# Patient Record
Sex: Male | Born: 1945 | Race: White | Hispanic: No | Marital: Married | Smoking: Former smoker
Health system: Southern US, Community
[De-identification: ages and names within clinical notes are randomized; demographics above are authoritative.]

## PROBLEM LIST (undated history)

## (undated) DIAGNOSIS — Z87898 Personal history of other specified conditions: Secondary | ICD-10-CM

## (undated) DIAGNOSIS — H3530 Unspecified macular degeneration: Secondary | ICD-10-CM

## (undated) DIAGNOSIS — Z86010 Personal history of colonic polyps: Secondary | ICD-10-CM

## (undated) DIAGNOSIS — K4090 Unilateral inguinal hernia, without obstruction or gangrene, not specified as recurrent: Secondary | ICD-10-CM

## (undated) DIAGNOSIS — D126 Benign neoplasm of colon, unspecified: Secondary | ICD-10-CM

## (undated) DIAGNOSIS — Z973 Presence of spectacles and contact lenses: Secondary | ICD-10-CM

## (undated) DIAGNOSIS — N400 Enlarged prostate without lower urinary tract symptoms: Secondary | ICD-10-CM

## (undated) DIAGNOSIS — Z8719 Personal history of other diseases of the digestive system: Secondary | ICD-10-CM

## (undated) DIAGNOSIS — R011 Cardiac murmur, unspecified: Secondary | ICD-10-CM

## (undated) DIAGNOSIS — R911 Solitary pulmonary nodule: Secondary | ICD-10-CM

## (undated) DIAGNOSIS — K5730 Diverticulosis of large intestine without perforation or abscess without bleeding: Secondary | ICD-10-CM

## (undated) DIAGNOSIS — Z860101 Personal history of adenomatous and serrated colon polyps: Secondary | ICD-10-CM

## (undated) DIAGNOSIS — R7303 Prediabetes: Secondary | ICD-10-CM

## (undated) DIAGNOSIS — K649 Unspecified hemorrhoids: Secondary | ICD-10-CM

## (undated) DIAGNOSIS — J439 Emphysema, unspecified: Secondary | ICD-10-CM

## (undated) DIAGNOSIS — K648 Other hemorrhoids: Secondary | ICD-10-CM

## (undated) DIAGNOSIS — K08109 Complete loss of teeth, unspecified cause, unspecified class: Secondary | ICD-10-CM

## (undated) DIAGNOSIS — I700 Atherosclerosis of aorta: Secondary | ICD-10-CM

## (undated) DIAGNOSIS — Z87820 Personal history of traumatic brain injury: Secondary | ICD-10-CM

## (undated) DIAGNOSIS — I10 Essential (primary) hypertension: Secondary | ICD-10-CM

## (undated) DIAGNOSIS — G3184 Mild cognitive impairment, so stated: Secondary | ICD-10-CM

## (undated) HISTORY — DX: Unspecified macular degeneration: H35.30

## (undated) HISTORY — DX: Benign neoplasm of colon, unspecified: D12.6

## (undated) HISTORY — PX: OTHER SURGICAL HISTORY: SHX169

## (undated) HISTORY — PX: POLYPECTOMY: SHX149

## (undated) HISTORY — DX: Other hemorrhoids: K64.8

## (undated) HISTORY — DX: Essential (primary) hypertension: I10

## (undated) HISTORY — PX: COLONOSCOPY: SHX174

---
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
## 1998-03-21 ENCOUNTER — Encounter: Payer: Self-pay | Admitting: Internal Medicine

## 2001-09-08 ENCOUNTER — Encounter: Payer: Self-pay | Admitting: Internal Medicine

## 2004-07-27 ENCOUNTER — Ambulatory Visit: Payer: Self-pay | Admitting: Internal Medicine

## 2004-08-17 ENCOUNTER — Ambulatory Visit: Payer: Self-pay | Admitting: Internal Medicine

## 2004-09-07 ENCOUNTER — Ambulatory Visit: Payer: Self-pay | Admitting: Internal Medicine

## 2004-09-08 ENCOUNTER — Encounter: Admission: RE | Admit: 2004-09-08 | Discharge: 2004-09-08 | Payer: Self-pay | Admitting: Internal Medicine

## 2006-06-28 ENCOUNTER — Ambulatory Visit: Payer: Self-pay | Admitting: Internal Medicine

## 2006-06-28 LAB — CONVERTED CEMR LAB
Hgb A1c MFr Bld: 5.8 % (ref 4.6–6.0)
LDL Cholesterol: 86 mg/dL (ref 0–99)
PSA: 1.53 ng/mL (ref 0.10–4.00)
VLDL: 18 mg/dL (ref 0–40)

## 2007-11-17 ENCOUNTER — Ambulatory Visit: Payer: Self-pay | Admitting: Gastroenterology

## 2007-11-21 ENCOUNTER — Telehealth: Payer: Self-pay | Admitting: Gastroenterology

## 2007-12-11 ENCOUNTER — Ambulatory Visit: Payer: Self-pay | Admitting: Gastroenterology

## 2007-12-11 ENCOUNTER — Encounter: Payer: Self-pay | Admitting: Gastroenterology

## 2007-12-12 ENCOUNTER — Encounter: Payer: Self-pay | Admitting: Gastroenterology

## 2008-01-26 ENCOUNTER — Ambulatory Visit: Payer: Self-pay | Admitting: Internal Medicine

## 2008-01-26 DIAGNOSIS — I10 Essential (primary) hypertension: Secondary | ICD-10-CM | POA: Insufficient documentation

## 2008-01-26 DIAGNOSIS — F17210 Nicotine dependence, cigarettes, uncomplicated: Secondary | ICD-10-CM | POA: Insufficient documentation

## 2008-01-26 DIAGNOSIS — Z720 Tobacco use: Secondary | ICD-10-CM | POA: Insufficient documentation

## 2008-01-26 DIAGNOSIS — D126 Benign neoplasm of colon, unspecified: Secondary | ICD-10-CM | POA: Insufficient documentation

## 2008-01-27 LAB — CONVERTED CEMR LAB
BUN: 10 mg/dL (ref 6–23)
Creatinine, Ser: 0.7 mg/dL (ref 0.4–1.5)
PSA: 1.53 ng/mL (ref 0.10–4.00)
Potassium: 4.2 meq/L (ref 3.5–5.1)

## 2008-01-29 ENCOUNTER — Encounter (INDEPENDENT_AMBULATORY_CARE_PROVIDER_SITE_OTHER): Payer: Self-pay | Admitting: *Deleted

## 2009-01-14 ENCOUNTER — Encounter: Payer: Self-pay | Admitting: Internal Medicine

## 2009-04-01 ENCOUNTER — Encounter: Payer: Self-pay | Admitting: Internal Medicine

## 2009-04-08 ENCOUNTER — Telehealth (INDEPENDENT_AMBULATORY_CARE_PROVIDER_SITE_OTHER): Payer: Self-pay | Admitting: *Deleted

## 2009-04-10 ENCOUNTER — Ambulatory Visit: Payer: Self-pay | Admitting: Gastroenterology

## 2009-04-10 DIAGNOSIS — K642 Third degree hemorrhoids: Secondary | ICD-10-CM | POA: Insufficient documentation

## 2009-04-10 DIAGNOSIS — Z860100 Personal history of colon polyps, unspecified: Secondary | ICD-10-CM | POA: Insufficient documentation

## 2009-04-10 DIAGNOSIS — K5730 Diverticulosis of large intestine without perforation or abscess without bleeding: Secondary | ICD-10-CM | POA: Insufficient documentation

## 2009-04-10 DIAGNOSIS — K644 Residual hemorrhoidal skin tags: Secondary | ICD-10-CM | POA: Insufficient documentation

## 2009-07-11 ENCOUNTER — Ambulatory Visit: Payer: Self-pay | Admitting: Internal Medicine

## 2009-07-11 DIAGNOSIS — N401 Enlarged prostate with lower urinary tract symptoms: Secondary | ICD-10-CM

## 2009-07-11 DIAGNOSIS — N138 Other obstructive and reflux uropathy: Secondary | ICD-10-CM | POA: Insufficient documentation

## 2009-10-28 ENCOUNTER — Encounter (INDEPENDENT_AMBULATORY_CARE_PROVIDER_SITE_OTHER): Payer: Self-pay | Admitting: *Deleted

## 2009-10-31 ENCOUNTER — Encounter: Payer: Self-pay | Admitting: Gastroenterology

## 2010-01-08 ENCOUNTER — Encounter (INDEPENDENT_AMBULATORY_CARE_PROVIDER_SITE_OTHER): Payer: Self-pay | Admitting: *Deleted

## 2010-01-09 ENCOUNTER — Ambulatory Visit: Payer: Self-pay | Admitting: Gastroenterology

## 2010-01-20 ENCOUNTER — Ambulatory Visit: Payer: Self-pay | Admitting: Gastroenterology

## 2010-01-23 ENCOUNTER — Encounter: Payer: Self-pay | Admitting: Gastroenterology

## 2010-03-07 ENCOUNTER — Encounter: Payer: Self-pay | Admitting: Internal Medicine

## 2010-07-12 LAB — CONVERTED CEMR LAB
ALT: 23 units/L (ref 0–53)
BUN: 7 mg/dL (ref 6–23)
CO2: 29 meq/L (ref 19–32)
Chloride: 100 meq/L (ref 96–112)
GFR calc non Af Amer: 120.69 mL/min (ref >60)
Glucose, Bld: 93 mg/dL (ref 70–99)
HCT: 43 % (ref 39.0–52.0)
Hemoglobin: 14.7 g/dL (ref 13.0–17.0)
Lymphocytes Relative: 25.3 % (ref 12.0–46.0)
MCHC: 34.3 g/dL (ref 30.0–36.0)
MCV: 97.1 fL (ref 78.0–100.0)
Neutro Abs: 5.9 10*3/uL (ref 1.4–7.7)
Neutrophils Relative %: 62.2 % (ref 43.0–77.0)
PSA: 2.77 ng/mL (ref 0.10–4.00)
Platelets: 262 10*3/uL (ref 150.0–400.0)
Potassium: 3.8 meq/L (ref 3.5–5.1)
RBC: 4.42 M/uL (ref 4.22–5.81)
RDW: 13.3 % (ref 11.5–14.6)
Sodium: 136 meq/L (ref 135–145)
Total CHOL/HDL Ratio: 2
WBC: 9.4 10*3/uL (ref 4.5–10.5)

## 2010-07-15 NOTE — Letter (Signed)
Summary: [facility] Instructions  [facility]  [address] [place], [place] [address]   Phone: [phone]  Fax: [phone]       Hunter Boone    1946/02/06    MRN: [id number]        Procedure Day Dorna Bloom:  [name]  01/20/10     Arrival Time:  10:00AM     Procedure Time:  11:00AM     Location of Procedure:                    [name] _  [facility] (4th Floor)                        PREPARATION FOR COLONOSCOPY WITH MOVIPREP   Starting 5 days prior to your procedure 01/15/10 do not eat nuts, seeds, popcorn, corn, beans, peas,  salads, or any raw vegetables.  Do not take any fiber supplements (e.g. Metamucil, Citrucel, and Benefiber).  THE DAY BEFORE YOUR PROCEDURE         DATE: 01/19/10  DAY: MONDAY  1.  Drink clear liquids the entire day-NO SOLID FOOD  2.  Do not drink anything colored red or purple.  Avoid juices with pulp.  No orange juice.  3.  Drink at least 64 oz. (8 glasses) of fluid/clear liquids during the day to prevent dehydration and help the prep work efficiently.  CLEAR LIQUIDS INCLUDE: Water Jello Ice Popsicles Tea (sugar ok, no milk/cream) Powdered fruit flavored drinks Coffee (sugar ok, no milk/cream) Gatorade Juice: apple, white grape, white cranberry  Lemonade Clear bullion, consomm, broth Carbonated beverages (any kind) Strained chicken noodle soup Hard Candy                             4.  In the morning, mix first dose of MoviPrep solution:    Empty 1 Pouch A and 1 Pouch B into the disposable container    Add lukewarm drinking water to the top line of the container. Mix to dissolve    Refrigerate (mixed solution should be used within 24 hrs)  5.  Begin drinking the prep at 5:00 p.m. The MoviPrep container is divided by 4 marks.   Every 15 minutes drink the solution down to the next mark (approximately 8 oz) until the full liter is complete.   6.  Follow completed prep with 16 oz of clear liquid of your choice (Nothing  red or purple).  Continue to drink clear liquids until bedtime.  7.  Before going to bed, mix second dose of MoviPrep solution:    Empty 1 Pouch A and 1 Pouch B into the disposable container    Add lukewarm drinking water to the top line of the container. Mix to dissolve    Refrigerate  THE DAY OF YOUR PROCEDURE      DATE: 01/20/10 DAY: TUESDAY  Beginning at 6:00AM (5 hours before procedure):         1. Every 15 minutes, drink the solution down to the next mark (approx 8 oz) until the full liter is complete.  2. Follow completed prep with 16 oz. of clear liquid of your choice.    3. You may drink clear liquids until 9:00AM (2 HOURS BEFORE PROCEDURE).   MEDICATION INSTRUCTIONS  Unless otherwise instructed, you should take regular prescription medications with a small sip of water   as early as possible the morning of  your procedure.   Additional medication instructions:   Hold HCTZ the morning of procedure.       OTHER INSTRUCTIONS  You will need a responsible adult at least 65 years of age to accompany you and drive you home.   This person must remain in the waiting room during your procedure.  Wear loose fitting clothing that is easily removed.  Leave jewelry and other valuables at home.  However, you may wish to bring a book to read or  an iPod/MP3 player to listen to music as you wait for your procedure to start.  Remove all body piercing jewelry and leave at home.  Total time from sign-in until discharge is approximately 2-3 hours.  You should go home directly after your procedure and rest.  You can resume normal activities the  day after your procedure.  The day of your procedure you should not:   Drive   Make legal decisions   Operate machinery   Drink alcohol   Return to work  You will receive specific instructions about eating, activities and medications before you leave.    The above instructions have been reviewed and explained to me by  [name] RN  January 09, 2010 9:39 AM     I fully understand and can verbalize these instructions _____________________________ Date _________

## 2010-07-15 NOTE — Miscellaneous (Signed)
Summary: [facility]  [facility]   Imported By: Lanelle Bal 03/16/2010 12:29:24  _____________________________________________________________________  External Attachment:    Type:   Image     Comment:   External Document

## 2010-07-15 NOTE — Letter (Signed)
Summary: Cancer Screening/[facility] Risk Profile  Cancer Screening/[facility]   Imported By: Lanelle Bal 07/17/2009 13:18:43  _____________________________________________________________________  External Attachment:    Type:   Image     Comment:   External Document

## 2010-07-15 NOTE — Miscellaneous (Signed)
Summary: Medication refill  Clinical Lists Changes  Medications: Changed medication from ANAMANTLE HC 3-0.5 % CREA (LIDOCAINE-HYDROCORTISONE ACE) apply rectally three times a day as needed to HYDROCORTISONE 2.5 % CREA (HYDROCORTISONE) apply rectally three times a day as needed - Signed Rx of HYDROCORTISONE 2.5 % CREA (HYDROCORTISONE) apply rectally three times a day as needed;  #98 g x 1;  Signed;  Entered by: Christie Nottingham CMA (AAMA);  Authorized by: Meryl Dare MD Clementeen Graham;  Method used: Electronically to [facility]*, [address] [place], [place]  [id number], Ph: [phone], Fax: [phone]    Prescriptions: HYDROCORTISONE 2.5 % CREA (HYDROCORTISONE) apply rectally three times a day as needed  #98 g x 1   Entered by:   Christie Nottingham CMA (AAMA)   Authorized by:   Meryl Dare MD [facility]   Signed by:   Christie Nottingham CMA Duncan Dull) on 10/31/2009   Method used:   Electronically to        [facility]* (retail)       [address]       [place], [place]  [address]       Ph: [phone]       Fax: [phone]   RxID:   [id number]

## 2010-07-15 NOTE — Assessment & Plan Note (Signed)
Summary: CPX AND FASTING LABS///[facility]   Vital Signs:  Patient profile:   65 year old male Height:      70 inches Weight:      141.4 pounds BMI:     20.36 Temp:     98.8 degrees F oral Pulse rate:   73 / minute Resp:     14 per minute BP sitting:   155 / 86  (left arm) Cuff size:   regular  Vitals Entered By: Shonna Chock (July 11, 2009 8:38 AM)  Comments REVIEWED MED LIST, PATIENT AGREED DOSE AND INSTRUCTION CORRECT    Primary Care Provider:  Marga Melnick, MD   History of Present Illness: Mr. Hunter Boone is here for a physical; he is recovering from a "cold". See BP; 130/81-82 @ home.  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 dementia ; Siblings: ? DM in bro  ; PGM ?DM; M aunt CVA; M aunt hepatic CA  Social History: no diet Occupation: Science writer Patient currently smokes. Pipe, 2 bowls / night Alcohol Use - 2-3 weekly Daily Caffeine Use 4/5 cups coffee / day Regular exercise-yes: walking 1/2 mpd  Review of Systems  The patient denies anorexia, fever, weight loss, weight gain, vision loss, decreased hearing, chest pain, syncope, dyspnea on exertion, peripheral edema, abdominal pain, melena, hematochezia, severe indigestion/heartburn, hematuria, suspicious skin lesions, depression, unusual weight change, abnormal bleeding, enlarged lymph nodes, and angioedema.         Some hesitancy; no nocturia. ENT:  Denies difficulty swallowing, ear discharge, earache, hoarseness, nasal congestion, sinus pressure, and sore throat;  No frontal headache, facial pain or purulence. Resp:  Complains of cough and sputum productive; denies excessive snoring, hypersomnolence, morning headaches, shortness of breath, and wheezing; Dark phlegm, decreasing.  Physical Exam  General:  Thin,in no acute distress; alert,appropriate and cooperative throughout examination Head:  Normocephalic and atraumatic without obvious abnormalities. Pattern  alopecia ; moustache & beard Eyes:  No corneal or conjunctival inflammation noted.[name] Perrla. Funduscopic exam benign, without hemorrhages, exudates or papilledema. Ears:  External ear exam shows no significant lesions or deformities.  Otoscopic examination reveals clear canals, tympanic membranes are intact bilaterally without bulging, retraction, inflammation or discharge. Hearing is grossly normal bilaterally. Nose:  External nasal examination shows no deformity or inflammation. Nasal mucosa : mild erythema without lesions or exudates.  Mouth:  Oral mucosa and oropharynx without lesions or exudates.  Teeth in good repair.Mild uvular erythema with mucocoele  Neck:  No deformities, masses, or tenderness noted. Chest Wall:  Increased AP diameter Lungs:  Normal respiratory effort, chest expands symmetrically. Lungs are clear to auscultation, no crackles or wheezes. BS slightly decreased Heart:  Normal rate and regular rhythm. S1 and S2 normal without gallop, murmur, click, rub . Heart heard best in epigastrium Abdomen:  Bowel sounds positive,abdomen soft and non-tender without masses, organomegaly .Weakness R inguinal area w/o hernia. Rectal:  No external abnormalities noted. Normal sphincter tone. No rectal masses or tenderness. Genitalia:  Testes bilaterally descended without nodularity, tenderness or masses. No scrotal masses or lesions. No penis lesions or urethral discharge. Prostate:  no nodules, no  asymmetry, no induration, and 1+ enlarged.   Msk:  No deformity or scoliosis noted of thoracic or  lumbar spine.   Pulses:  R and L carotid,radial,dorsalis pedis and posterior tibial pulses are full and equal bilaterally Extremities:  No  cyanosis, edema, or deformity noted with normal full range of motion of all joints.  Minimal clubbing; deformed L thumbnail Neurologic:  alert & oriented X3 and DTRs symmetrical and normal.   Skin:  Intact without suspicious lesions or rashes. Rosacea not active. Keratosis L shoulder Cervical Nodes:  No lymphadenopathy noted Axillary Nodes:  No palpable lymphadenopathy Inguinal Nodes:  No significant adenopathy Psych:  memory intact for recent and remote, normally interactive, and good eye contact.     Impression & Recommendations:  Problem # 1:  PREVENTIVE HEALTH CARE (ICD-V70.0)  Orders: T-2 View CXR (71020TC) Tobacco use cessation intermediate 3-10 minutes ([address]) EKG w/ Interpretation (93[address]) Venipuncture ([address]) TLB-Lipid Panel (80061-LIPID) TLB-BMP (Basic Metabolic Panel-BMET) (80048-METABOL) TLB-CBC Platelet - w/Differential (85025-CBCD) TLB-Hepatic/Liver Function Pnl (80076-HEPATIC) TLB-TSH (Thyroid Stimulating Hormone) (84443-TSH) TLB-PSA (Prostate Specific Antigen) (84153-PSA)  Problem # 2:  ACUTE BRONCHITIS (ICD-466.0)  resolving ; meds declined  Orders: Venipuncture (60454)  Problem # 3:  HYPERPLASIA 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 Take 1 tab  once daily  Orders: EKG w/ Interpretation (93000) Venipuncture (29562) TLB-Lipid Panel (80061-LIPID)  Complete Medication  List: 1)  Hydrochlorothiazide 25 Mg Tabs (Hydrochlorothiazide) .... Take 1 tab  once daily 2)  Metronidazole 0.75 % Ext Gel (Metronidazole) .... Apply daily as needed 3)  Centrum Silver Tabs (Multiple vitamins-minerals) .... Once daily 4)  Preservision Areds Caps (Multiple vitamins-minerals) .... Take 4 tablets once daily 5)  Fish Oil Maximum Strength 1200 Mg Caps (Omega-3 fatty acids) .... Once daily 6)  Lutein 20 Mg Caps (Lutein) .[name].. 1 by mouth once daily 7)  Anamantle Hc 3-0.5 % Crea (Lidocaine-hydrocortisone ace) .... Apply rectally three times a day as needed 8)  Anusol-hc 25 Mg Supp (Hydrocortisone acetate) .... One suppository into rectum at bedtime 9)  Align Caps (Probiotic product) .... One tablet by mouth once daily  Patient Instructions: 1)  Stop Smoking Tips: Choose a Quit date. Cut down before the Quit date. decide what you will do as a substitute when you feel the urge to smoke(gum,toothpick,exercise). 2)  Check your Blood Pressure regularly. If it is above: 135/85 ON AVERAGE you should make an appointment. Consider Saw Palmetto Extract for BPH. Prescriptions: METRONIDAZOLE 0.75 % EXT GEL (METRONIDAZOLE) APPLY DAILY as needed  #60 grams x 5   Entered and Authorized by:   Marga Melnick MD   Signed by:   Marga Melnick MD on 07/11/2009   Method used:   Print then Give to Patient   RxID:   [id number] HYDROCHLOROTHIAZIDE 25 MG  TABS (HYDROCHLOROTHIAZIDE) take 1 tab  once daily  #90 Tablet x 3   Entered and Authorized by:   Marga Melnick MD   Signed by:   Marga Melnick MD on 07/11/2009   Method used:   Print then Give to Patient   RxID:   [id number]

## 2010-07-15 NOTE — Procedures (Signed)
Summary: Colonoscopy  Patient: [name] Note: All result statuses are Final unless otherwise noted.  Tests: (1) Colonoscopy (COL)   COL Colonoscopy           DONE (C)     [facility]     [address]     [place], [place]  [address]           COLONOSCOPY PROCEDURE REPORT           PATIENT:  Hunter Boone, Hunter Boone  MR#:  [id number]     BIRTHDATE:  [birth year], 64 yrs. old  GENDER:  male     ENDOSCOPIST:  Judie Petit T. Russella Dar, MD, [facility]           PROCEDURE DATE:  01/20/2010     PROCEDURE:  Colonoscopy with biopsy     ASA CLASS:  Class II     INDICATIONS:  1) surveillance and high-risk screening  2)     follow-up of polyp, adenomatous polyps, 11/2007. Piecemeal     polypectomy.     MEDICATIONS:   Fentanyl 50 mcg IV, Versed 7 mg IV     DESCRIPTION OF PROCEDURE:   After the risks benefits and     alternatives of the procedure were thoroughly explained, informed     consent was obtained.  Digital rectal exam was performed and     revealed moderate external hemorrhoids.   The LB PCF-Q180AL     [id number] endoscope was introduced through the anus and advanced to     the cecum, which was identified by both the appendix and ileocecal     valve, limited by fair prep.    The quality of the prep was     Moviprep fair.  The instrument was then slowly withdrawn as the     colon was fully examined.     <<PROCEDUREIMAGES>>     FINDINGS:  Mild diverticulosis was found in the descending colon.     Four polyps were found in the sigmoid colon. They were 2 - 3 mm in     size. The polyps were removed using cold biopsy forceps.  A normal     appearing cecum, ileocecal valve, and appendiceal orifice were     identified. The ascending, hepatic flexure, transverse, splenic     flexure, and rectum appeared unremarkable. Retroflexed views in     the rectum revealed internal hemorrhoids, moderate.  The time to     cecum =  4  minutes. The scope was then withdrawn (time =  12     min) from the patient and the  procedure completed.     COMPLICATIONS:  None           ENDOSCOPIC IMPRESSION:     1) Mild diverticulosis in the descending colon     2) 2 - 3 mm, Four polyps in the sigmoid colon     3) Internal and external hemorrhoids           RECOMMENDATIONS:     1) Await pathology     2) High fiber diet with liberal fluid intake.     3) Repeat Colonoscopy in 3 years  given fair prep and history of     polyps.           Venita Lick. Russella Dar, MD, Clementeen Graham           CC: Pecola Lawless, MD           n.  REVISED:  01/20/2010 12:02 PM     eSIGNED:   Judie Petit T. Stark at 01/20/2010 12:02 PM           Hunter Boone, [id number]  Note: An exclamation mark (!) indicates a result that was not dispersed into the flowsheet. Document Creation Date: 01/20/2010 12:04 PM _______________________________________________________________________  (1) Order result status: Final Collection or observation date-time: 01/20/2010 11:57 Requested date-time:  Receipt date-time:  Reported date-time:  Referring Physician:   Ordering Physician: Claudette Head [phone]) Specimen Source:  Source: Launa Grill Order Number: [id number] Lab site:   Appended Document: Colonoscopy     Procedures Next Due Date:    Colonoscopy: 01/2013

## 2010-07-15 NOTE — Miscellaneous (Signed)
Summary: [facility] Previsit/prep  Clinical Lists Changes  Medications: Added new medication of MOVIPREP 100 GM  SOLR (PEG-KCL-NACL-NASULF-NA ASC-C) As per prep instructions. - Signed Rx of MOVIPREP 100 GM  SOLR (PEG-KCL-NACL-NASULF-NA ASC-C) As per prep instructions.;  #1 x 0;  Signed;  Entered by: [name] RN;  Authorized by: Meryl Dare MD Clementeen Graham;  Method used: Electronically to [facility]*, [address], [place], [place]  [address], Ph: [phone], Fax: [phone] Observations: Added new observation of NKA: T (01/09/2010 8:51)    Prescriptions: MOVIPREP 100 GM  SOLR (PEG-KCL-NACL-NASULF-NA ASC-C) As per prep instructions.  #1 x 0   Entered by:   [name] RN   Authorized by:   Meryl Dare MD [facility]   Signed by:   [name] RN on 01/09/2010   Method used:   Electronically to        [facility]* (retail)       [address]       [place], [place]  [id number]       Ph: [phone]       Fax: [phone]   RxID:   [id number]

## 2010-07-15 NOTE — Letter (Signed)
Summary: Patient Notice- Polyp Results  [facility]  [address] [place], [place] [address]   Phone: [phone]  Fax: [phone]        January 23, 2010 MRN: [id number]    Hunter Boone [address] [place], [place]  [address]    Dear Hunter Boone,  I am pleased to inform you that the colon polyp(s) removed during your recent colonoscopy was (were) found to be benign (no cancer detected) upon pathologic examination.  I recommend you have a repeat colonoscopy examination in 3 years to look for recurrent polyps, as having colon polyps increases your risk for having recurrent polyps or even colon cancer in the future.  Should you develop new or worsening symptoms of abdominal pain, bowel habit changes or bleeding from the rectum or bowels, please schedule an evaluation with either your primary care physician or with me.  Continue treatment plan as outlined the day of your exam.  Please call us if you are having persistent problems or have questions about your condition that have not been fully answered at this time.  Sincerely,  Meryl Dare MD [facility]  This letter has been electronically signed by your physician.  Appended Document: Patient Notice- Polyp Results letter mailed

## 2010-07-15 NOTE — Letter (Signed)
Summary: Colonoscopy Letter   [facility]  [address] [place], [place] [address]   Phone: [phone]  Fax: [phone]      Oct 28, 2009 MRN: [id number]   Hunter Boone [address] [place], [place]  [address]   Dear [name],   According to your medical record, it is time for you to schedule a Colonoscopy. The American Cancer Society recommends this procedure as a method to detect early colon cancer. Patients with a family history of colon cancer, or a personal history of colon polyps or inflammatory bowel disease are at increased risk.  This letter has beeen generated based on the recommendations made at the time of your procedure. If you feel that in your particular situation this may no longer apply, please contact our office.  Please call our office at [phone] to schedule this appointment or to update your records at your earliest convenience.  Thank you for cooperating with [place] to provide you with the very best care possible.   Sincerely,  Judie Petit T. Russella Dar, M.D.  [facility] [phone]

## 2010-08-19 ENCOUNTER — Encounter: Payer: Self-pay | Admitting: Internal Medicine

## 2010-08-19 ENCOUNTER — Other Ambulatory Visit: Payer: Self-pay | Admitting: Internal Medicine

## 2010-08-19 ENCOUNTER — Encounter (INDEPENDENT_AMBULATORY_CARE_PROVIDER_SITE_OTHER): Payer: Medicare Other | Admitting: Internal Medicine

## 2010-08-19 DIAGNOSIS — E785 Hyperlipidemia, unspecified: Secondary | ICD-10-CM | POA: Insufficient documentation

## 2010-08-19 DIAGNOSIS — Z86010 Personal history of colonic polyps: Secondary | ICD-10-CM

## 2010-08-19 DIAGNOSIS — Z23 Encounter for immunization: Secondary | ICD-10-CM

## 2010-08-19 DIAGNOSIS — I10 Essential (primary) hypertension: Secondary | ICD-10-CM

## 2010-08-19 DIAGNOSIS — Z125 Encounter for screening for malignant neoplasm of prostate: Secondary | ICD-10-CM

## 2010-08-19 DIAGNOSIS — Z0000 Encounter for general adult medical examination without abnormal findings: Secondary | ICD-10-CM

## 2010-08-19 DIAGNOSIS — R7309 Other abnormal glucose: Secondary | ICD-10-CM | POA: Insufficient documentation

## 2010-08-19 LAB — BASIC METABOLIC PANEL
BUN: 10 mg/dL (ref 6–23)
CO2: 27 mEq/L (ref 19–32)
Chloride: 100 mEq/L (ref 96–112)
Creatinine, Ser: 0.8 mg/dL (ref 0.4–1.5)
GFR: 103.1 mL/min (ref >60.00)

## 2010-08-19 LAB — CBC WITH DIFFERENTIAL/PLATELET
Basophils Relative: 0.4 % (ref 0.0–3.0)
Eosinophils Relative: 2 % (ref 0.0–5.0)
HCT: 42.6 % (ref 39.0–52.0)
Hemoglobin: 14.8 g/dL (ref 13.0–17.0)
Lymphocytes Relative: 25.1 % (ref 12.0–46.0)
Neutrophils Relative %: 64.3 % (ref 43.0–77.0)
Platelets: 278 10*3/uL (ref 150.0–400.0)

## 2010-08-19 LAB — LIPID PANEL
Cholesterol: 175 mg/dL (ref 0–200)
LDL Cholesterol: 99 mg/dL (ref 0–99)
Triglycerides: 81 mg/dL (ref 0.0–149.0)

## 2010-08-19 LAB — HEPATIC FUNCTION PANEL
ALT: 25 U/L (ref 0–53)
Alkaline Phosphatase: 53 U/L (ref 39–117)
Bilirubin, Direct: 0.2 mg/dL (ref 0.0–0.3)
Total Protein: 6.9 g/dL (ref 6.0–8.3)

## 2010-08-21 ENCOUNTER — Other Ambulatory Visit: Payer: Self-pay | Admitting: Internal Medicine

## 2010-08-21 ENCOUNTER — Ambulatory Visit (INDEPENDENT_AMBULATORY_CARE_PROVIDER_SITE_OTHER)
Admission: RE | Admit: 2010-08-21 | Discharge: 2010-08-21 | Disposition: A | Discharge status: Home / Self Care | Payer: Medicare Other | Source: Ambulatory Visit | Attending: Internal Medicine | Admitting: Internal Medicine

## 2010-08-21 DIAGNOSIS — F17200 Nicotine dependence, unspecified, uncomplicated: Secondary | ICD-10-CM

## 2010-08-25 NOTE — Assessment & Plan Note (Signed)
Summary: CPX AND FASTING LABS//SPH/PH   Vital Signs:  Patient profile:   65 year old male Height:      70.5 inches Weight:      139.8 pounds BMI:     19.85 Temp:     98.3 degrees F oral Pulse rate:   72 / minute Resp:     14 per minute BP sitting:   128 / 86  (left arm) Cuff size:   regular  Vitals Entered By: Shonna Chock CMA (August 19, 2010 8:29 AM) CC: CPX with fasting labs , Lipid Management   Primary Care Provider:  Marga Melnick, MD  CC:  CPX with fasting labs  and Lipid Management.  History of Present Illness: Here for Medicare AWV:see Diagnoses; chart updated 2Physical Activities: push ups 30 /day & walking 6-7 X / week < 30 min w/o symptoms. 3.Depression/mood: no issues 4.Hearing: whisper heard @ 6 ft 5.ADL's: no limitations 6.Fall Risk: no issues 7.Home Safety: no risk 8.Height, weight, &visual acuity:see VS 9.Counseling: POA & Living Wil to be redrawn 10.Labs ordered based on risk factors: see Orders 11.Referral Coordination: none; Colonoscopy current 12. Care Plan: see Instructions 13. Cognitive Assessment: Oriented X3 ; memory  & recall intact normal  ; "WORLD " spelled backwards; mood & affect normal.    Hypertension Follow-Up: He  denies lightheadedness, urinary frequency, headaches, edema, fatigue.  , chest pain, chest pressure, exercise intolerance, dyspnea, palpitations, and syncope.  Compliance with medications (by patient report) has been near 100%.  The patient reports that dietary compliance has been good.  Adjunctive measures currently used by the patient include salt restriction.  BP @ home < 135/85 on average.  Lipid Management History:      Positive NCEP/ATP III risk factors include male age 11 years old or older, current tobacco user, and hypertension.  Negative NCEP/ATP III risk factors include non-diabetic, HDL cholesterol greater than 60, no family history for ischemic heart disease, no ASHD (atherosclerotic heart disease), no prior stroke/TIA,  no peripheral vascular disease, and no history of aortic aneurysm.     Preventive Screening-Counseling & Management  Alcohol-Tobacco     Alcohol drinks/day: 1     Smoking Status: current     Year Started: 1970     Pipe use/week: 1 bowl / day  Caffeine-Diet-Exercise     Caffeine use/day: 3 coffees; 2 teas / day  Hep-HIV-STD-Contraception     Dental Visit-last 6 months yes     Sun Exposure-Excessive: no  Safety-Violence-Falls     Seat Belt Use: yes     Smoke Detectors: yes      Blood Transfusions:  no.        Travel History:  [place] and 1968-[place].    Current Medications (verified): 1)  Hydrochlorothiazide 25 Mg  Tabs (Hydrochlorothiazide) .... Take 1 Tab  Once Daily 2)  Metronidazole 0.75 % Ext Gel (Metronidazole) .... Apply Daily As Needed 3)  Centrum Silver  Tabs (Multiple Vitamins-Minerals) .... Once Daily 4)  Preservision Areds  Caps (Multiple Vitamins-Minerals) .... Take 4 Tablets Once Daily 5)  Fish Oil Maximum Strength 1200 Mg Caps (Omega-3 Fatty Acids) .... Once Daily 6)  Hydrocortisone 2.5 % Crea (Hydrocortisone) .... Apply Rectally Three Times A Day As Needed 7)  Aspirin 81 Mg Tabs (Aspirin) .[name].. 1 By Mouth Once Daily 8)  Saw Palmetto 450 Mg Caps (Saw Palmetto (Serenoa Repens)) .... 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  Past Surgical History: Colon polypectomy (5)2009& , (4) 2011 , tubular adenmas & hyperplastic , Dr Russella Dar; due   2014  Family History: Father: [place]ers  d 68; Mother: Osteoporosis, ? dementia ; Siblings:bro: ? DM   ; PGM: ?DM; M aunt :CVA; M aunt: hepatic cancer; 1st cousin : hepatic  cancer  Social History: no diet Occupation: Acupuncturist Patient currently smokes. Pipe, 1 bowl / night Alcohol Use 1 / day Daily Caffeine Use 3 cups coffee / day; 2 teas Regular exercise-yes: walking Caffeine use/day:  3 coffees; 2 teas / day Dental Care w/in 6 mos.:  yes Sun Exposure-Excessive:  no Seat Belt Use:  yes Blood Transfusions:  no  Review of Systems  The patient denies anorexia, fever, weight loss, weight gain, vision loss, decreased hearing, hoarseness, prolonged cough, hemoptysis, abdominal pain, melena, hematochezia, severe indigestion/heartburn, hematuria, suspicious skin lesions, abnormal bleeding, enlarged lymph nodes, and angioedema.    Physical Exam  General:  Thin,in no acute distress; alert,appropriate and cooperative throughout examination Head:  Normocephalic and atraumatic without obvious abnormalities. Pattern  alopecia . Eyes:  No corneal or conjunctival inflammation noted. Ptosis OS > OD.Perrla. Funduscopic exam benign, without hemorrhages, exudates or papilledema.  Ears:  External ear exam shows no significant lesions or deformities.  Otoscopic examination reveals clear canals, tympanic membranes are intact bilaterally without bulging, retraction, inflammation or discharge. Hearing is grossly normal bilaterally. Nose:  External nasal examination shows no deformity or inflammation. Nasal mucosa are pink and moist without lesions or exudates. Mouth:  Oral mucosa and oropharynx without lesions or exudates.  Teeth in good repair. Mild pharyngeal erythema.   Neck:  No deformities, masses, or tenderness noted. Lungs:  Normal respiratory effort, chest expands symmetrically. Lungs are clear to auscultation, no crackles or wheezes. Heart:  Normal rate and regular rhythm. S1 and S2 normal without gallop, murmur, click, rub .S4 Abdomen:  Bowel sounds positive,abdomen soft and non-tender without masses, organomegaly or hernias noted. Dullness RUQ Rectal:   External  hemorrhoidal tissues  noted. Normal sphincter tone. No rectal masses or tenderness. Genitalia:  Testes bilaterally descended without nodularity, tenderness or masses. No scrotal masses or lesions. No penis lesions or urethral discharge. Prostate:  Prostate gland firm and smooth, no enlargement, nodularity, tenderness, mass, asymmetry or induration. Msk:  No deformity or scoliosis noted of thoracic or lumbar spine.   Pulses:  R and L carotid,radial,dorsalis pedis and posterior tibial pulses are full and equal bilaterally Extremities:  No clubbing, cyanosis, edema, or deformity noted with normal full range of motion of all joints.   Neurologic:  alert & oriented X3 and DTRs symmetrical and normal.   Skin:  Intact without suspicious lesions. Mild facia Rosacea Cervical Nodes:  No lymphadenopathy noted Axillary Nodes:  No palpable lymphadenopathy Inguinal Nodes:  No significant adenopathy Psych:  memory intact for recent and remote, normally interactive, and good eye contact.     Impression & Recommendations:  Problem # 1:  PREVENTIVE HEALTH CARE (ICD-V70.0)  Orders: Medicare -1st Annual Wellness Visit [phone])  Problem # 2:  HYPERTENSION (ICD-401.9)  His updated medication list for this problem includes:    Hydrochlorothiazide 25 Mg Tabs (Hydrochlorothiazide) .[name]... 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 Take 1 tab  once daily 2)  Metronidazole 0.75 % Ext Gel (Metronidazole) .... Apply daily as needed 3)  Centrum Silver Tabs (Multiple vitamins-minerals) .... Once daily 4)  Preservision Areds Caps (Multiple vitamins-minerals) .... Take 4 tablets once daily 5)  Fish Oil Maximum Strength 1200 Mg Caps (Omega-3 fatty acids) .... Once daily 6)  Hydrocortisone 2.5 % Crea (Hydrocortisone) .... Apply rectally three times a day as needed 7)  Aspirin 81 Mg Tabs (Aspirin) .[name].. 1 by mouth once daily 8)  Saw Palmetto 450 Mg Caps (Saw palmetto (serenoa repens)) .... 2am, 2 pm  Other Orders: Pneumococcal Vaccine (13086) Admin 1st Vaccine (57846) TLB-Hepatic/Liver Function Pnl (80076-HEPATIC) TLB-TSH (Thyroid Stimulating Hormone) (84443-TSH)  Lipid Assessment/Plan:      Based on NCEP/ATP III, the patient's risk factor category is "2 or more risk factors and a calculated 10 year CAD risk of < 20%".  The patient's lipid goals are as follows: Total cholesterol goal is 200; LDL cholesterol goal is 130; HDL cholesterol goal is 40; Triglyceride goal is 150.    Patient Instructions: 1)  Stop Smoking Tips: Choose a Quit date. Cut down before the Quit date. decide what you will do as a substitute when you feel the urge to smoke(gum,toothpick,exercise). 2)  It is important that you exercise regularly at least 20 minutes 5 times a week. If you develop chest pain, have severe difficulty breathing, or feel very tired , stop exercising immediately and seek medical attention. 3)  Check your Blood Pressure regularly. If it is above: 135/85 ON AVERAGE you should make an appointment. Prescriptions: METRONIDAZOLE 0.75 % EXT GEL (METRONIDAZOLE) APPLY DAILY as needed  #45 Each x 3   Entered and Authorized by:   Marga Melnick MD   Signed by:   Marga Melnick MD on 08/19/2010   Method used:   Print then Give to Patient   RxID:   [id number] HYDROCHLOROTHIAZIDE 25 MG  TABS (HYDROCHLOROTHIAZIDE) take 1 tab  once daily   #90 Each x 3   Entered and Authorized by:   Marga Melnick MD   Signed by:   Marga Melnick MD on 08/19/2010   Method used:   Print then Give to Patient   RxID:   [id number]    Orders Added: 1)  Pneumococcal Vaccine [90732] 2)  Admin 1st Vaccine [90471] 3)  Medicare -1st Annual Wellness Visit [G0438] 4)  Est. Patient Level III [95638] 5)  EKG w/ Interpretation [93000] 6)  Venipuncture [36415] 7)  TLB-Lipid Panel [80061-LIPID] 8)  TLB-BMP (Basic Metabolic Panel-BMET) [80048-METABOL] 9)  TLB-CBC Platelet - w/Differential [85025-CBCD] 10)  TLB-Hepatic/Liver Function Pnl [80076-HEPATIC] 11)  TLB-TSH (Thyroid Stimulating Hormone) [84443-TSH] 12)  TLB-PSA (Prostate Specific Antigen) [75643-PIR] 13)  T-2 View CXR [71020TC]   Immunization History:  Tetanus/Td Immunization History:    Tetanus/Td:  historical (07/27/2004)  Immunizations Administered:  Pneumonia Vaccine:    Vaccine Type: Pneumovax (Medicare)    Site: left deltoid    Mfr: Merck    Dose: 0.5 ml    Route: IM    Given by:  Chrae Malloy CMA    Exp. Date: 10/14/2011    Lot #: [id number]   Immunization History:  Tetanus/Td Immunization History:    Tetanus/Td:  Historical (07/27/2004)  Immunizations Administered:  Pneumonia Vaccine:    Vaccine Type: Pneumovax (Medicare)    Site: left deltoid    Mfr: Merck    Dose: 0.5 ml    Route: IM    Given by: Shonna Chock CMA    Exp. Date: 10/14/2011    Lot #: [id number]      Appended Document: CPX AND FASTING LABS//SPH/PH

## 2010-11-05 ENCOUNTER — Encounter: Payer: Self-pay | Admitting: Internal Medicine

## 2010-11-05 ENCOUNTER — Ambulatory Visit (INDEPENDENT_AMBULATORY_CARE_PROVIDER_SITE_OTHER): Payer: Medicare Other | Admitting: Internal Medicine

## 2010-11-05 VITALS — BP 130/78 | HR 72 | Temp 98.2°F | Wt 141.2 lb

## 2010-11-05 DIAGNOSIS — H029 Unspecified disorder of eyelid: Secondary | ICD-10-CM

## 2010-11-05 MED ORDER — ERYTHROMYCIN 5 MG/GM OP OINT: TOPICAL_OINTMENT | Freq: Every day | OPHTHALMIC | Status: AC

## 2010-11-05 NOTE — Patient Instructions (Signed)
Warning signs are  fever, pain and redness of the lid, puslike discharge, or vision loss. See your Ophthalmologist immediately if any of these appear.

## 2010-11-05 NOTE — Progress Notes (Signed)
  Subjective:    Patient ID: Hunter Boone, male    DOB: 03-03-1946, 65 y.o.   MRN: [id number]  HPI EYE COMPLAINT  Location: right  Onset: 7-10 days ago Better with: cool compresses for the puffiness  Symptoms: Discharge: no Pain: no Photophobia: yes, ?from Macular  degeneration Decreased Vision: no URI symptoms: no Itching/Allergy sxs:yes itching localized on lateral eye periodically ; puffiness around  upper lateral lid  intermittently in am Glaucoma: no Recent eye surgery: no Contact lens use: no  Red Flags Trauma: no Foreign Body: no Vomiting/HA: no Halos around lights: no      Review of Systems     Objective:   Physical Exam General appearance is of good health and nourishment; no acute distress or increased work of breathing is present.  No  lymphadenopathy about the head, neck, or axilla noted.   Eyes: No conjunctival inflammation or lid edema is present. There is no scleral icterus. Full EOM . Normal fundal exam. Marked vision loss OS ("lazy eye"); good vision OD even w/o lenses.Minor nontender hyperpigmentation mid upper lid  Ears:  External ear exam shows no significant lesions or deformities.  Otoscopic examination reveals clear canals, tympanic membranes are intact bilaterally without bulging, retraction, inflammation or discharge.  Nose:  External nasal examination shows no deformity or inflammation. Nasal mucosa are pink and moist without lesions or exudates. No septal dislocation or dislocation.No obstruction to airflow.   Oral exam: Dental hygiene is good; lips and gums are healthy appearing.There is no oropharyngeal erythema or exudate noted.    Skin: Warm & dry w/o jaundice or tenting.          Assessment & Plan:  #1 localized edema and itching intermittently of the upper lateral lid. He may be having intermittent tear duct  obstruction. Apparently cool compresses relieve this. There is mild hyperemia of the mid upper lid without clinical  cellulitis.  Plan: Topical erythromycin will be used over the lid. If symptoms  persist or progress it  Is essential to see  his Ophthalmologist as he is essentially  monocular

## 2011-05-25 ENCOUNTER — Other Ambulatory Visit: Payer: Self-pay | Admitting: Internal Medicine

## 2011-08-03 ENCOUNTER — Telehealth: Payer: Self-pay | Admitting: Internal Medicine

## 2011-08-03 NOTE — Telephone Encounter (Signed)
Patients wife called in stating that patient had a "shaking episode" a few days ago. She states that patient is feeling fine now, so I transferred her to [facility].  Patients wife called right back,stating that she did not want to wait for a call back from [facility]. I offered same day appt but she declined, stating that patient could not come in until Friday. I made appointment for Friday, 08/06/11.

## 2011-08-03 NOTE — Telephone Encounter (Signed)
Noted, will forward to Dr.[name]

## 2011-08-05 ENCOUNTER — Ambulatory Visit (INDEPENDENT_AMBULATORY_CARE_PROVIDER_SITE_OTHER): Payer: Medicare Other | Admitting: Internal Medicine

## 2011-08-05 DIAGNOSIS — R319 Hematuria, unspecified: Secondary | ICD-10-CM | POA: Diagnosis not present

## 2011-08-05 DIAGNOSIS — N4289 Other specified disorders of prostate: Secondary | ICD-10-CM | POA: Diagnosis not present

## 2011-08-05 DIAGNOSIS — I10 Essential (primary) hypertension: Secondary | ICD-10-CM

## 2011-08-05 DIAGNOSIS — R0989 Other specified symptoms and signs involving the circulatory and respiratory systems: Secondary | ICD-10-CM | POA: Diagnosis not present

## 2011-08-05 DIAGNOSIS — R6883 Chills (without fever): Secondary | ICD-10-CM | POA: Diagnosis not present

## 2011-08-05 DIAGNOSIS — N401 Enlarged prostate with lower urinary tract symptoms: Secondary | ICD-10-CM

## 2011-08-05 DIAGNOSIS — N4282 Prostatosis syndrome: Secondary | ICD-10-CM

## 2011-08-05 DIAGNOSIS — F17200 Nicotine dependence, unspecified, uncomplicated: Secondary | ICD-10-CM

## 2011-08-05 DIAGNOSIS — N138 Other obstructive and reflux uropathy: Secondary | ICD-10-CM

## 2011-08-05 LAB — BASIC METABOLIC PANEL
BUN: 13 mg/dL (ref 6–23)
CO2: 26 mEq/L (ref 19–32)
Calcium: 9.5 mg/dL (ref 8.4–10.5)
Chloride: 96 mEq/L (ref 96–112)
Creatinine, Ser: 0.8 mg/dL (ref 0.4–1.5)
GFR: 101.33 mL/min (ref >60.00)
Glucose, Bld: 75 mg/dL (ref 70–99)
Potassium: 3.9 mEq/L (ref 3.5–5.1)
Sodium: 132 mEq/L — ABNORMAL LOW (ref 135–145)

## 2011-08-05 LAB — CBC WITH DIFFERENTIAL/PLATELET
Basophils Relative: 0.8 % (ref 0.0–3.0)
Eosinophils Relative: 2 % (ref 0.0–5.0)
Lymphocytes Relative: 25.7 % (ref 12.0–46.0)
Monocytes Relative: 7.9 % (ref 3.0–12.0)
Neutrophils Relative %: 63.6 % (ref 43.0–77.0)

## 2011-08-05 LAB — POCT URINALYSIS DIPSTICK
Bilirubin, UA: NEGATIVE
Ketones, UA: NEGATIVE
Protein, UA: NEGATIVE
Spec Grav, UA: 1.01
Urobilinogen, UA: 0.2

## 2011-08-05 MED ORDER — LOSARTAN POTASSIUM 100 MG PO TABS: 100.0000 mg | ORAL_TABLET | Freq: Every day | ORAL | Status: DC

## 2011-08-05 NOTE — Progress Notes (Signed)
Subjective:    Patient ID: Hunter Boone, male    DOB: [birth year], 66 y.o.   MRN: [id number]  HPI  On 07/29/11 at approximally 7:30 pm he experienced pressure in the upper chest anteriorly at the base of the neck. This was instantaneous and did not recur. Over the next 10 minutes he had frank chills and rigor. There were no localizing signs or symptoms otherwise. The only unusual factor that day was a dramatic increase in sugar intake because it was his birthday as well as Valentine's Day.  He denied any associated radiation of the upper chest discomfort and denied associated nausea or diaphoresis. He had no associated shortness of breath or hemoptysis.   Review of Systems  He denies any signs of infection such as frontal headache, facial pain, nasal purulence, purulent sputum, sore throat or earache. He had no purulent sputum. He does continue to smoke a pipe.  He denies abdominal pain, diarrhea, flank pain ,hematuria, pyuria, or dysuria.He has had some "prostate pressure"despite SPE. Flow is decreased.  He had no associated calf edema or pain.  There were no localizing neurologic symptoms such as syncope or asymmetric weakness or paresthesias. He has had intermittent numbness in the fifth digit on each hand and foot intermittently.  Blood pressure has not been controlled recently; systolic blood pressures between 140-150/80.  He denies any associated rash with the event. He had no swelling, pain, or redness of joints.    Objective:   Physical Exam Gen.: Thin but  well-nourished in appearance. Alert, appropriate and cooperative throughout exam.  Eyes: No corneal or conjunctival inflammation noted. Slight ptosis on the left. Unsustained nystagmus with lateral gaze. No  Ears: External  ear exam reveals no significant lesions or deformities. Canals clear .TMs normal. Hearing is grossly normal bilaterally. Nose: External nasal exam reveals no deformity or inflammation. Nasal mucosa are dry.  No lesions or exudates noted.  Mouth: Oral mucosa and oropharynx reveal no lesions or exudates. Teeth in good repair. Neck: No deformities, masses, or tenderness noted.  Lungs: Normal respiratory effort; chest expands symmetrically. IncreasedAP diameter of the chest. Scattered low-grade rhonchi without increased work of breathing. Heart: Normal rate and rhythm. Normal S1 and S2. No gallop, click, or rub. No  murmur. Abdomen: Bowel sounds normal; abdomen soft and nontender. No masses, organomegaly or hernias noted. Genitalia/DRE: Genital exam is unremarkable. The right prostate is slightly enlarged and the left is upper limits of normal. There is tenderness to palpation                                                        Musculoskeletal/extremities: Lordosis noted of  the thoracic  spine. No clubbing, cyanosis, edema, or deformity noted. Joints normal. Nail health  good. Vascular: Carotid, radial artery, dorsalis pedis and  posterior tibial pulses are full and equal. No bruits present. Neurologic: Alert and oriented x3. Deep tendon reflexes symmetrical and normal.          Skin: Intact without suspicious lesions or rashes. Plethora of the hands with decreased temperature but no ischemic change Lymph: No cervical, axillary, or inguinal lymphadenopathy present. Psych: Mood and affect are normal. Normally interactive  Assessment & Plan:  #1 self-limited chills and right lower; possibly transient sepsis from the prostate  #2 clinical prostatitis  #3 abnormal lung sounds; ongoing tobacco use  #4 suboptimal blood pressure control  Plan: See orders and recommendations.

## 2011-08-05 NOTE — Progress Notes (Signed)
Addended by: Silvio Pate D on: 08/05/2011 04:54 PM   Modules accepted: Orders

## 2011-08-05 NOTE — Patient Instructions (Signed)
Order for x-rays entered into  the computer; these will be performed at [address] [facility]. across from [facility]. No appointment is necessary. Blood Pressure Goal  Ideally is an AVERAGE < 135/85. This AVERAGE should be calculated from @ least 5-7 BP readings taken @ different times of day on different days of week. You should not respond to isolated BP readings , but rather the AVERAGE for that week Please think about quitting smoking. Review the risks we discussed. Please call 1-800-QUIT-NOW ([phone]) for free smoking cessation counseling.

## 2011-08-06 ENCOUNTER — Ambulatory Visit: Payer: Medicare Other | Admitting: Internal Medicine

## 2011-08-06 ENCOUNTER — Ambulatory Visit (INDEPENDENT_AMBULATORY_CARE_PROVIDER_SITE_OTHER)
Admission: RE | Admit: 2011-08-06 | Discharge: 2011-08-06 | Disposition: A | Discharge status: Home / Self Care | Payer: Medicare Other | Source: Ambulatory Visit | Attending: Internal Medicine | Admitting: Internal Medicine

## 2011-08-06 DIAGNOSIS — R0609 Other forms of dyspnea: Secondary | ICD-10-CM | POA: Diagnosis not present

## 2011-08-06 DIAGNOSIS — R0989 Other specified symptoms and signs involving the circulatory and respiratory systems: Secondary | ICD-10-CM | POA: Diagnosis not present

## 2011-08-07 LAB — URINE CULTURE: Colony Count: NO GROWTH

## 2011-09-15 ENCOUNTER — Encounter: Payer: Self-pay | Admitting: Internal Medicine

## 2011-09-15 ENCOUNTER — Ambulatory Visit (INDEPENDENT_AMBULATORY_CARE_PROVIDER_SITE_OTHER): Payer: Medicare Other | Admitting: Internal Medicine

## 2011-09-15 VITALS — BP 132/78 | HR 60 | Temp 98.2°F | Wt 141.4 lb

## 2011-09-15 DIAGNOSIS — L0390 Cellulitis, unspecified: Secondary | ICD-10-CM | POA: Diagnosis not present

## 2011-09-15 DIAGNOSIS — L259 Unspecified contact dermatitis, unspecified cause: Secondary | ICD-10-CM | POA: Diagnosis not present

## 2011-09-15 DIAGNOSIS — L0291 Cutaneous abscess, unspecified: Secondary | ICD-10-CM | POA: Diagnosis not present

## 2011-09-15 MED ORDER — HYDROCHLOROTHIAZIDE 25 MG PO TABS: 25.0000 mg | ORAL_TABLET | Freq: Every day | ORAL | Status: DC

## 2011-09-15 MED ORDER — DOXYCYCLINE HYCLATE 100 MG PO TABS: 100.0000 mg | ORAL_TABLET | Freq: Two times a day (BID) | ORAL | Status: AC

## 2011-09-15 NOTE — Progress Notes (Signed)
  Subjective:    Patient ID: Hunter Boone, male    DOB: 1945-12-22, 66 y.o.   MRN: [id number]  HPI Two- 3 days ago he incidentally noted raised areas over the left posterior thorax. There was some intermittent burning but no significant discomfort. He denies fever, chills, or sweats.  He has applied alcohol, top of antibiotics, and finally a steroid cream without significant response.  He questioned spider bites but he denies abdominal pain, nausea or vomiting  Review of Systems blood pressure control is improved with the adjustment of medications. His requested refill of HCTZ. Potassium was 3.9 when recently checked     Objective:   Physical Exam  He appears to be in no distress; he is thin but barrel chested.  He is no lymphadenopathy about the neck or axilla.  He has 2 excoriated lesions  5X2 & 4X2 mm along the posterior axillary line directly above each other, separated by 7 cm. Around these lesions is the appearance of contact dermatitis related to Band-Aids. There is mild erythema around the excoriated area  There are no other lesions of the posterior thorax         Assessment & Plan:  #1 two   small excoriated areas with mild cellulitis and associated areas of contact dermatitis from Band-Aid. The distribution suggests this is not herpes zoster but skin lesions which have become secondarily infected.  Plan: See orders and recommendations

## 2011-09-15 NOTE — Patient Instructions (Addendum)
Apply Cort Aid OTC twice a day ONLY  to the Band Aid pattern areas if itching. Do not apply any medications to the excoriated areas as we discussed . Dip gauze in  sterile saline and applied to the lesions twice a day ;No antibiotic ointment. The saline can be purchased at the drugstore or you can make your own .Boil cup of salt in a gallon of water. Store mixture  in a clean container.Report Warning  signs as discussed (red streaks, pus, fever, increasing pain).  Blood Pressure Goal  Ideally is an AVERAGE < 135/85. This AVERAGE should be calculated from @ least 5-7 BP readings taken @ different times of day on different days of week. You should not respond to isolated BP readings , but rather the AVERAGE for that week

## 2011-12-23 ENCOUNTER — Other Ambulatory Visit: Payer: Self-pay | Admitting: Internal Medicine

## 2012-03-27 DIAGNOSIS — Z23 Encounter for immunization: Secondary | ICD-10-CM | POA: Diagnosis not present

## 2012-04-10 ENCOUNTER — Other Ambulatory Visit: Payer: Self-pay

## 2012-04-10 MED ORDER — HYDROCHLOROTHIAZIDE 25 MG PO TABS: 25.0000 mg | ORAL_TABLET | Freq: Every day | ORAL | Status: DC

## 2012-04-10 NOTE — Telephone Encounter (Signed)
Spoke with patient, I am unsure why he was given #5 pills for we had not received a refill request on him recently for this medication. Patient aware I will medication at pharmacy for him. Patient requested 90 day supple

## 2012-04-10 NOTE — Telephone Encounter (Signed)
Pt left a message on triage line stating requested a Rx for blood pressure med but was only given 5 capsules wanted to know why? I looked to see if needed to have OV or labs but can't see any notes. Last OV Vickie Epley 08/05/11   PLz advise   MW

## 2012-06-05 ENCOUNTER — Ambulatory Visit (INDEPENDENT_AMBULATORY_CARE_PROVIDER_SITE_OTHER): Payer: Medicare Other

## 2012-06-05 DIAGNOSIS — Z0000 Encounter for general adult medical examination without abnormal findings: Secondary | ICD-10-CM

## 2012-06-05 DIAGNOSIS — Z23 Encounter for immunization: Secondary | ICD-10-CM

## 2012-06-05 MED ORDER — TETANUS-DIPHTH-ACELL PERTUSSIS 5-2.5-18.5 LF-MCG/0.5 IM SUSP: 0.5000 mL | Freq: Once | INTRAMUSCULAR | Status: DC

## 2012-06-27 ENCOUNTER — Other Ambulatory Visit: Payer: Self-pay | Admitting: Internal Medicine

## 2012-06-27 MED ORDER — LOSARTAN POTASSIUM 100 MG PO TABS: ORAL_TABLET | ORAL | Status: DC

## 2012-06-27 NOTE — Telephone Encounter (Signed)
refill Losartan Potassium (Tab) 100 MG TAKE 1 TABLET ONCE DAILY. #30 lsat fill 12.17.13

## 2012-06-27 NOTE — Telephone Encounter (Signed)
RX sent

## 2012-07-04 ENCOUNTER — Telehealth: Payer: Self-pay | Admitting: Internal Medicine

## 2012-07-04 NOTE — Telephone Encounter (Signed)
Please contact patient and schedule appointment, last OV 09/2011 (9 months ago)

## 2012-07-04 NOTE — Telephone Encounter (Signed)
Pt came in to say that he was having his teeth cleaned to [facility] and his BP was 142/70. They would not continue to give him the exam unitl they heard from [place] that the Bp was in a safe range. Pt would like you to fax or email the clinic to give them the ok. Fax [phone] and [email or web address]. Pls also call pt to let him know what is going on.

## 2012-07-06 ENCOUNTER — Telehealth: Payer: Self-pay | Admitting: *Deleted

## 2012-07-06 NOTE — Telephone Encounter (Signed)
Spoke with patient will come in tomorrow am at 9:30 for B/P check. Results to be faxed to [phone], the [facility]. Patient also has OV scheduled for 08/14/12 with [name].

## 2012-07-29 ENCOUNTER — Other Ambulatory Visit: Payer: Self-pay

## 2012-08-14 ENCOUNTER — Encounter: Payer: Medicare Other | Admitting: Internal Medicine

## 2012-08-24 ENCOUNTER — Telehealth: Payer: Self-pay | Admitting: Internal Medicine

## 2012-08-24 MED ORDER — LOSARTAN POTASSIUM 100 MG PO TABS: ORAL_TABLET | ORAL | Status: DC

## 2012-08-24 NOTE — Telephone Encounter (Signed)
refill  Losartan Potassium (Tab) 100 MG APPOINTMENT DUE 09/2012, 1 by mouth daily #90 last fill 2.13.14 NOTE pt has CPE for 5.14.14

## 2012-09-27 ENCOUNTER — Other Ambulatory Visit: Payer: Self-pay | Admitting: Internal Medicine

## 2012-10-25 ENCOUNTER — Ambulatory Visit (INDEPENDENT_AMBULATORY_CARE_PROVIDER_SITE_OTHER): Payer: Medicare Other | Admitting: Internal Medicine

## 2012-10-25 ENCOUNTER — Encounter: Payer: Self-pay | Admitting: Internal Medicine

## 2012-10-25 VITALS — BP 110/72 | HR 62 | Temp 98.1°F | Resp 12 | Ht 70.08 in | Wt 135.0 lb

## 2012-10-25 DIAGNOSIS — Z86010 Personal history of colonic polyps: Secondary | ICD-10-CM

## 2012-10-25 DIAGNOSIS — R7309 Other abnormal glucose: Secondary | ICD-10-CM

## 2012-10-25 DIAGNOSIS — Z0000 Encounter for general adult medical examination without abnormal findings: Secondary | ICD-10-CM

## 2012-10-25 DIAGNOSIS — I10 Essential (primary) hypertension: Secondary | ICD-10-CM

## 2012-10-25 DIAGNOSIS — R9431 Abnormal electrocardiogram [ECG] [EKG]: Secondary | ICD-10-CM

## 2012-10-25 LAB — HEPATIC FUNCTION PANEL
ALT: 22 U/L (ref 0–53)
AST: 26 U/L (ref 0–37)
Albumin: 4 g/dL (ref 3.5–5.2)
Alkaline Phosphatase: 52 U/L (ref 39–117)
Bilirubin, Direct: 0.1 mg/dL (ref 0.0–0.3)
Total Bilirubin: 1.2 mg/dL (ref 0.3–1.2)
Total Protein: 6.5 g/dL (ref 6.0–8.3)

## 2012-10-25 LAB — CBC WITH DIFFERENTIAL/PLATELET
Basophils Absolute: 0.1 10*3/uL (ref 0.0–0.1)
Basophils Relative: 0.8 % (ref 0.0–3.0)
Eosinophils Absolute: 0.2 10*3/uL (ref 0.0–0.7)
Eosinophils Relative: 1.9 % (ref 0.0–5.0)
HCT: 41 % (ref 39.0–52.0)
Hemoglobin: 14.1 g/dL (ref 13.0–17.0)
Lymphocytes Relative: 28.9 % (ref 12.0–46.0)
Lymphs Abs: 2.7 10*3/uL (ref 0.7–4.0)
MCHC: 34.4 g/dL (ref 30.0–36.0)
MCV: 94.6 fl (ref 78.0–100.0)
Monocytes Absolute: 1 10*3/uL (ref 0.1–1.0)
Monocytes Relative: 11.2 % (ref 3.0–12.0)
Neutro Abs: 5.3 10*3/uL (ref 1.4–7.7)
Neutrophils Relative %: 57.2 % (ref 43.0–77.0)
Platelets: 336 10*3/uL (ref 150.0–400.0)
RBC: 4.34 Mil/uL (ref 4.22–5.81)
RDW: 13.9 % (ref 11.5–14.6)
WBC: 9.2 10*3/uL (ref 4.5–10.5)

## 2012-10-25 LAB — BASIC METABOLIC PANEL WITH GFR
BUN: 10 mg/dL (ref 6–23)
CO2: 26 meq/L (ref 19–32)
Calcium: 9.1 mg/dL (ref 8.4–10.5)
Chloride: 96 meq/L (ref 96–112)
Creatinine, Ser: 0.6 mg/dL (ref 0.4–1.5)
GFR: 140.03 mL/min
Glucose, Bld: 85 mg/dL (ref 70–99)
Potassium: 4 meq/L (ref 3.5–5.1)
Sodium: 129 meq/L — ABNORMAL LOW (ref 135–145)

## 2012-10-25 LAB — LIPID PANEL
Cholesterol: 157 mg/dL (ref 0–200)
HDL: 60.3 mg/dL (ref >39.00)
LDL Cholesterol: 77 mg/dL (ref 0–99)
Triglycerides: 101 mg/dL (ref 0.0–149.0)
VLDL: 20.2 mg/dL (ref 0.0–40.0)

## 2012-10-25 MED ORDER — HYDROCHLOROTHIAZIDE 25 MG PO TABS: ORAL_TABLET | ORAL | Status: DC

## 2012-10-25 MED ORDER — LOSARTAN POTASSIUM 100 MG PO TABS: ORAL_TABLET | ORAL | Status: DC

## 2012-10-25 NOTE — Patient Instructions (Addendum)
Minimal Blood Pressure Goal= AVERAGE < 140/90;  Ideal is an AVERAGE < 135/85. This AVERAGE should be calculated from @ least 5-7 BP readings taken @ different times of day on different days of week. You should not respond to isolated BP readings , but rather the AVERAGE for that week .Please bring your  blood pressure cuff to office visits to verify that it is reliable.It  can also be checked against the blood pressure device at the pharmacy. Finger or wrist cuffs are not dependable; an arm cuff is. Take the EKG to any emergency room or preop visits. There are nonspecific changes; as long as there is no new change these are not clinically significant . If the old EKG is not available for comparison; it may result in unnecessary hospitalization for observation with significant unnecessary expense. Please think about quitting smoking. Review the risks we discussed. Please call 1-800-QUIT-NOW [phone]) for free smoking cessation counseling.

## 2012-10-25 NOTE — Progress Notes (Signed)
Subjective:    Patient ID: Hunter Boone, male    DOB: 05-21-1946, 67 y.o.   MRN: [id number]  HPI Medicare Wellness Visit:  Psychosocial & medical history were reviewed as required by Medicare (abuse,antisocial behavioral risks,firearm risk).  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 Power of Attorney/Living Will status : needed Ophthalmology exam not current; PMH of macular degeneration Hearing evaluation not current Orientation :oriented X 3  Memory & recall :good Math testing:good Mood & affect : normal . Depression / anxiety: denied Travel history : last 1969 [place]  Immunization status :Shingles  needed Transfusion history:  none  Preventive health surveillance ( colonoscopy as per protocol/ SOC): due Dental care:  Every 6 mos. Chart reviewed &  Updated. Active issues reviewed & addressed.      Review of Systems CHRONIC HYPERTENSION follow-up:  Home blood pressure ?/90 or less Patient is compliant with medications No adverse effects noted from medication No added salt diet   No chest pain, palpitations, dyspnea, claudication,edema or paroxysmal nocturnal dyspnea described. No significant lightheadedness, headache, epistaxis, or syncope         Objective:   Physical Exam Gen.: Thin but adequately nourished in appearance. Alert, appropriate and cooperative throughout exam. Appears younger than stated age  Head: Normocephalic without obvious abnormalities;  faint alopecia. Beard & moustache  Eyes: No corneal or conjunctival inflammation noted.  Extraocular motion intact. Vision grossly decreased OS even with lenses Ears: External  ear exam reveals no significant lesions or deformities. Canals clear .TMs normal. Hearing  is grossly normal bilaterally. Nose: External nasal exam reveals no deformity or inflammation. Nasal mucosa are pink and moist. No lesions or exudates noted.  Mouth: Oral mucosa and oropharynx reveal no lesions or exudates. Teeth in good repair. Gravelly voice. Neck: No deformities, masses, or tenderness noted. Range of motion normal. Thyroid small. Lungs: Normal respiratory effort; chest expands symmetrically. Lungs are clear to auscultation without rales, wheezes, or increased work of breathing.Decreased BS.  Chest: increased AP diameter Heart: Normal rate and rhythm. Normal S1 and S2. No gallop, click, or rub. S4 w/o murmur. Abdomen: Bowel sounds normal; abdomen soft and nontender. No masses, organomegaly or hernias noted. Genitalia: Genitalia normal except for left supratesticular grauloma. Prostate is normal without enlargement, asymmetry, nodularity, or induration.         Musculoskeletal/extremities: Accentuated curvature of mid thoracic spine. No clubbing, cyanosis, edema, or significant extremity  deformity noted. Range of motion normal .Tone & strength  Normal. Joints normal . Nail health good. Able to lie down & sit up w/o help. Negative SLR bilaterally Vascular: Carotid, radial artery, dorsalis pedis and  posterior tibial pulses are full and equal. No bruits present. Neurologic: Alert and oriented x3. Deep tendon reflexes symmetrical and normal.          Skin: Intact without suspicious lesions or rashes.Plethora hands ; hands cool. Lymph: No cervical, axillary, or inguinal lymphadenopathy present. Psych: Mood and affect are normal. Normally interactive  Assessment & Plan:  #1 Medicare Wellness Exam; criteria met ; data entered #2 Problem List reviewed ; Assessment/ Recommendations made Plan: see Orders

## 2012-11-27 ENCOUNTER — Encounter: Payer: Self-pay | Admitting: Gastroenterology

## 2013-01-23 ENCOUNTER — Telehealth: Payer: Self-pay | Admitting: *Deleted

## 2013-01-23 DIAGNOSIS — Z23 Encounter for immunization: Secondary | ICD-10-CM

## 2013-01-23 MED ORDER — ZOSTER VACCINE LIVE 19400 UNT/0.65ML ~~LOC~~ SOLR: 0.6500 mL | Freq: Once | SUBCUTANEOUS | Status: DC

## 2013-01-23 NOTE — Telephone Encounter (Signed)
Refill request from pharmacy  is requesting a Rx for zostavax for patient. Please Advise. SW//CMA

## 2013-01-23 NOTE — Telephone Encounter (Signed)
This is okay if not allergic to neomycin,eggs, or gelatin.

## 2013-01-23 NOTE — Telephone Encounter (Signed)
Rx for Zostavax sent to [facility]

## 2013-02-01 ENCOUNTER — Encounter: Payer: Medicare Other | Admitting: Gastroenterology

## 2013-02-15 ENCOUNTER — Telehealth: Payer: Self-pay

## 2013-02-15 NOTE — Telephone Encounter (Signed)
Yes

## 2013-02-15 NOTE — Telephone Encounter (Signed)
Last prep with Moviprep was fair.

## 2013-02-16 ENCOUNTER — Ambulatory Visit (AMBULATORY_SURGERY_CENTER): Payer: Self-pay | Admitting: *Deleted

## 2013-02-16 VITALS — Ht 72.0 in | Wt 135.2 lb

## 2013-02-16 DIAGNOSIS — Z86010 Personal history of colonic polyps: Secondary | ICD-10-CM

## 2013-02-16 MED ORDER — MOVIPREP 100 G PO SOLR: 1.0000 | Freq: Once | ORAL | Status: DC

## 2013-02-16 NOTE — Progress Notes (Signed)
Denies allergies to eggs or soy products. Denies complications with sedation or anesthesia. 

## 2013-02-17 ENCOUNTER — Encounter: Payer: Self-pay | Admitting: Internal Medicine

## 2013-02-26 ENCOUNTER — Encounter: Payer: Medicare Other | Admitting: Gastroenterology

## 2013-04-17 DIAGNOSIS — Z23 Encounter for immunization: Secondary | ICD-10-CM | POA: Diagnosis not present

## 2013-04-19 ENCOUNTER — Other Ambulatory Visit: Payer: Self-pay

## 2013-04-24 ENCOUNTER — Ambulatory Visit (AMBULATORY_SURGERY_CENTER): Payer: Medicare Other | Admitting: Gastroenterology

## 2013-04-24 ENCOUNTER — Encounter: Payer: Self-pay | Admitting: Gastroenterology

## 2013-04-24 VITALS — BP 152/94 | HR 58 | Temp 96.3°F | Resp 23 | Ht 72.0 in | Wt 135.0 lb

## 2013-04-24 DIAGNOSIS — Z1211 Encounter for screening for malignant neoplasm of colon: Secondary | ICD-10-CM | POA: Diagnosis not present

## 2013-04-24 DIAGNOSIS — D126 Benign neoplasm of colon, unspecified: Secondary | ICD-10-CM

## 2013-04-24 DIAGNOSIS — Z860100 Personal history of colon polyps, unspecified: Secondary | ICD-10-CM

## 2013-04-24 DIAGNOSIS — I10 Essential (primary) hypertension: Secondary | ICD-10-CM | POA: Diagnosis not present

## 2013-04-24 MED ORDER — SODIUM CHLORIDE 0.9 % IV SOLN: 500.0000 mL | INTRAVENOUS | Status: DC

## 2013-04-24 NOTE — Progress Notes (Signed)
Patient did not experience any of the following events: a burn prior to discharge; a fall within the facility; wrong site/side/patient/procedure/implant event; or a hospital transfer or hospital admission upon discharge from the facility. (G8907) Patient did not have preoperative order for IV antibiotic SSI prophylaxis. (G8918)  

## 2013-04-24 NOTE — Progress Notes (Addendum)
Procedure delay due to non-working monitor. VSS patient comfortable. Tech support notified to [employer]. Family of patient aware. [id number]-Patient returned to preop room for comfort. Monitor system continues to be assessed by tech support and biomed.

## 2013-04-24 NOTE — Progress Notes (Signed)
Lidocaine-40mg IV prior to Propofol InductionPropofol given over incremental dosages 

## 2013-04-24 NOTE — Progress Notes (Signed)
Called to room to assist during endoscopic procedure.  Patient ID and intended procedure confirmed with present staff. Received instructions for my participation in the procedure from the performing physician.  

## 2013-04-24 NOTE — Patient Instructions (Signed)
YOU HAD AN ENDOSCOPIC PROCEDURE TODAY AT THE [facility]: Refer to the procedure report that was given to you for any specific questions about what was found during the examination.  If the procedure report does not answer your questions, please call your gastroenterologist to clarify.  If you requested that your care partner not be given the details of your procedure findings, then the procedure report has been included in a sealed envelope for you to review at your convenience later.  YOU SHOULD EXPECT: Some feelings of bloating in the abdomen. Passage of more gas than usual.  Walking can help get rid of the air that was put into your GI tract during the procedure and reduce the bloating. If you had a lower endoscopy (such as a colonoscopy or flexible sigmoidoscopy) you may notice spotting of blood in your stool or on the toilet paper. If you underwent a bowel prep for your procedure, then you may not have a normal bowel movement for a few days.  DIET: Your first meal following the procedure should be a light meal and then it is ok to progress to your normal diet.  A half-sandwich or bowl of soup is an example of a good first meal.  Heavy or fried foods are harder to digest and may make you feel nauseous or bloated.  Likewise meals heavy in dairy and vegetables can cause extra gas to form and this can also increase the bloating.  Drink plenty of fluids but you should avoid alcoholic beverages for 24 hours.  ACTIVITY: Your care partner should take you home directly after the procedure.  You should plan to take it easy, moving slowly for the rest of the day.  You can resume normal activity the day after the procedure however you should NOT DRIVE or use heavy machinery for 24 hours (because of the sedation medicines used during the test).    SYMPTOMS TO REPORT IMMEDIATELY: A gastroenterologist can be reached at any hour.  During normal business hours, 8:30 AM to 5:00 PM Monday through Friday,  call [phone].  After hours and on weekends, please call the GI answering service at [phone] who will take a message and have the physician on call contact you.   Following lower endoscopy (colonoscopy or flexible sigmoidoscopy):  Excessive amounts of blood in the stool  Significant tenderness or worsening of abdominal pains  Swelling of the abdomen that is new, acute  Fever of 100F or higher    FOLLOW UP: If any biopsies were taken you will be contacted by phone or by letter within the next 1-3 weeks.  Call your gastroenterologist if you have not heard about the biopsies in 3 weeks.  Our staff will call the home number listed on your records the next business day following your procedure to check on you and address any questions or concerns that you may have at that time regarding the information given to you following your procedure. This is a courtesy call and so if there is no answer at the home number and we have not heard from you through the emergency physician on call, we will assume that you have returned to your regular daily activities without incident.  SIGNATURES/CONFIDENTIALITY: You and/or your care partner have signed paperwork which will be entered into your electronic medical record.  These signatures attest to the fact that that the information above on your After Visit Summary has been reviewed and is understood.  Full responsibility of the confidentiality   of this discharge information lies with you and/or your care-partner.   Polyp, diverticulosis, hemorrhoid, and high fiber diet information given.    Liberal fluid intake.  Repeat colonoscopy in three years-2017.

## 2013-04-24 NOTE — Op Note (Signed)
[facility] [address] [place], [address]   COLONOSCOPY PROCEDURE REPORT PATIENT: Hunter Boone, Hunter Boone  MR#: [id number] BIRTHDATE: 1946/02/07 , 67  yrs. old GENDER: Male ENDOSCOPIST: Meryl Dare, MD, [facility] PROCEDURE DATE:  04/24/2013 PROCEDURE:   Colonoscopy with biopsy and snare polypectomy First Screening Colonoscopy - Avg.  risk and is 50 yrs.  old or older - No.  Prior Negative Screening - Now for repeat screening. N/A  History of Adenoma - Now for follow-up colonoscopy & has been > or = to 3 yrs.  Yes hx of adenoma.  Has been 3 or more years since last colonoscopy.  Polyps Removed Today? Yes. ASA CLASS:   Class II INDICATIONS:Patient's personal history of adenomatous colon polyps.  MEDICATIONS: MAC sedation, administered by CRNA and propofol (Diprivan) 200mg  IV DESCRIPTION OF PROCEDURE:   After the risks benefits and alternatives of the procedure were thoroughly explained, informed consent was obtained.  A digital rectal exam revealed no abnormalities of the rectum.   The LB [id number]  endoscope was introduced through the anus and advanced to the cecum, which was identified by both the appendix and ileocecal valve. No adverse events experienced.   The quality of the prep was good, using MoviPrep  The instrument was then slowly withdrawn as the colon was fully examined.  COLON FINDINGS: Three sessile polyps were found at the cecum measuring 5-7 mm in size.  A polypectomy was performed with a cold snare.  The resection was complete and the polyp tissue was completely retrieved.   A sessile polyp measuring 7 mm in size was found in the transverse colon.  A polypectomy was performed with a cold snare.  The resection was complete and the polyp tissue was completely retrieved.   A sessile polyp measuring 3 mm in size was found in the sigmoid colon.  A polypectomy was performed with cold forceps.  The resection was complete and the polyp tissue  was completely retrieved.   Moderate diverticulosis was noted in the descending colon and sigmoid colon.   The colon was otherwise normal.  There was no diverticulosis, inflammation, polyps or cancers unless previously stated.  Retroflexed views revealed moderate internal hemorrhoids. The time to cecum=3 minutes 27 seconds.  Withdrawal time=10 minutes 15 seconds.  The scope was withdrawn and the procedure completed. COMPLICATIONS: There were no complications. ENDOSCOPIC IMPRESSION: 1.   Three sessile polyps measuring 5-7 mm at the cecum; polypectomy performed with a cold snare 2.   Sessile polyp measuring 7 mm  in the transverse colon; polypectomy performed with a cold snare 3.   Sessile polyp measuring 3 mm in the sigmoid colon; polypectomy performed with cold forceps 4.   Moderate diverticulosis in the descending colon and sigmoid colon 5.   Moderate internal hemorrhoids RECOMMENDATIONS: 1.  Await pathology results 2.  High fiber diet with liberal fluid intake. 3.  Repeat Colonoscopy in 3 years. eSigned:  Meryl Dare, MD, [facility] 04/24/2013 9:29 AM

## 2013-04-25 ENCOUNTER — Telehealth: Payer: Self-pay | Admitting: *Deleted

## 2013-04-25 NOTE — Telephone Encounter (Signed)
Message left

## 2013-04-30 ENCOUNTER — Encounter: Payer: Self-pay | Admitting: Gastroenterology

## 2013-05-01 ENCOUNTER — Encounter: Payer: Self-pay | Admitting: Internal Medicine

## 2013-11-20 ENCOUNTER — Other Ambulatory Visit: Payer: Self-pay | Admitting: Internal Medicine

## 2014-02-21 ENCOUNTER — Other Ambulatory Visit: Payer: Self-pay | Admitting: Internal Medicine

## 2014-03-04 ENCOUNTER — Encounter: Payer: Medicare Other | Admitting: Internal Medicine

## 2014-03-12 ENCOUNTER — Other Ambulatory Visit (INDEPENDENT_AMBULATORY_CARE_PROVIDER_SITE_OTHER): Payer: Medicare Other

## 2014-03-12 ENCOUNTER — Encounter: Payer: Self-pay | Admitting: Internal Medicine

## 2014-03-12 ENCOUNTER — Ambulatory Visit (INDEPENDENT_AMBULATORY_CARE_PROVIDER_SITE_OTHER)
Admission: RE | Admit: 2014-03-12 | Discharge: 2014-03-12 | Disposition: A | Discharge status: Home / Self Care | Payer: Medicare Other | Source: Ambulatory Visit | Attending: Internal Medicine | Admitting: Internal Medicine

## 2014-03-12 ENCOUNTER — Ambulatory Visit (INDEPENDENT_AMBULATORY_CARE_PROVIDER_SITE_OTHER): Payer: Medicare Other | Admitting: Internal Medicine

## 2014-03-12 VITALS — BP 150/90 | HR 68 | Temp 98.1°F | Resp 13 | Ht 72.0 in | Wt 136.0 lb

## 2014-03-12 DIAGNOSIS — I10 Essential (primary) hypertension: Secondary | ICD-10-CM | POA: Diagnosis not present

## 2014-03-12 DIAGNOSIS — Z86010 Personal history of colonic polyps: Secondary | ICD-10-CM

## 2014-03-12 DIAGNOSIS — F17200 Nicotine dependence, unspecified, uncomplicated: Secondary | ICD-10-CM

## 2014-03-12 DIAGNOSIS — J438 Other emphysema: Secondary | ICD-10-CM | POA: Diagnosis not present

## 2014-03-12 DIAGNOSIS — Z23 Encounter for immunization: Secondary | ICD-10-CM

## 2014-03-12 DIAGNOSIS — R7309 Other abnormal glucose: Secondary | ICD-10-CM | POA: Diagnosis not present

## 2014-03-12 LAB — BASIC METABOLIC PANEL
BUN: 10 mg/dL (ref 6–23)
CALCIUM: 9.4 mg/dL (ref 8.4–10.5)
CO2: 26 mEq/L (ref 19–32)
Chloride: 102 mEq/L (ref 96–112)
Creatinine, Ser: 0.7 mg/dL (ref 0.4–1.5)
GFR: 120.97 mL/min (ref >60.00)
Glucose, Bld: 95 mg/dL (ref 70–99)
Potassium: 4.3 mEq/L (ref 3.5–5.1)
SODIUM: 134 meq/L — AB (ref 135–145)

## 2014-03-12 LAB — CBC WITH DIFFERENTIAL/PLATELET
BASOS ABS: 0 10*3/uL (ref 0.0–0.1)
BASOS PCT: 0.4 % (ref 0.0–3.0)
Eosinophils Absolute: 0.3 10*3/uL (ref 0.0–0.7)
Eosinophils Relative: 3.1 % (ref 0.0–5.0)
HCT: 42.6 % (ref 39.0–52.0)
HEMOGLOBIN: 14.6 g/dL (ref 13.0–17.0)
LYMPHS PCT: 30.7 % (ref 12.0–46.0)
Lymphs Abs: 3.4 10*3/uL (ref 0.7–4.0)
MCHC: 34.4 g/dL (ref 30.0–36.0)
MCV: 92.1 fl (ref 78.0–100.0)
Monocytes Absolute: 0.9 10*3/uL (ref 0.1–1.0)
Monocytes Relative: 8.3 % (ref 3.0–12.0)
NEUTROS ABS: 6.3 10*3/uL (ref 1.4–7.7)
Neutrophils Relative %: 57.5 % (ref 43.0–77.0)
Platelets: 338 10*3/uL (ref 150.0–400.0)
RBC: 4.63 Mil/uL (ref 4.22–5.81)
RDW: 14.1 % (ref 11.5–15.5)
WBC: 10.9 10*3/uL — ABNORMAL HIGH (ref 4.0–10.5)

## 2014-03-12 LAB — HEMOGLOBIN A1C: Hgb A1c MFr Bld: 5.8 % (ref 4.6–6.5)

## 2014-03-12 MED ORDER — LOSARTAN POTASSIUM 100 MG PO TABS: ORAL_TABLET | ORAL | Status: DC

## 2014-03-12 NOTE — Assessment & Plan Note (Signed)
Blood pressure goals reviewed. BMET 

## 2014-03-12 NOTE — Patient Instructions (Signed)
Minimal Blood Pressure Goal= AVERAGE < 140/90;  Ideal is an AVERAGE < 135/85. This AVERAGE should be calculated from @ least 5-7 BP readings taken @ different times of day on different days of week. You should not respond to isolated BP readings , but rather the AVERAGE for that week .Please bring your  blood pressure cuff to office visits to verify that it is reliable.It  can also be checked against the blood pressure device at the pharmacy.  Your next office appointment will be determined based upon review of your pending labs & x-rays. Those instructions will be transmitted to you through My Chart .

## 2014-03-12 NOTE — Assessment & Plan Note (Signed)
A1c

## 2014-03-12 NOTE — Assessment & Plan Note (Deleted)
CBC

## 2014-03-12 NOTE — Progress Notes (Signed)
Pre visit review using our clinic review tool, if applicable. No additional management support is needed unless otherwise documented below in the visit note. 

## 2014-03-12 NOTE — Assessment & Plan Note (Signed)
CBC

## 2014-03-12 NOTE — Assessment & Plan Note (Signed)
Risk discussed 

## 2014-03-12 NOTE — Progress Notes (Signed)
Subjective:    Patient ID: Hunter Boone, male    DOB: 05-Mar-1946, 68 y.o.   MRN: [id number]  HPI  He is here to assess active health issues & conditions. PMH, FH, & Social history verified & updated   He has been compliant with his blood pressure medication without adverse effect. He is on a heart healthy, low salt diet.  Blood pressures range from 138/68-166/82. No average provided.  He denies active cardia pulmonary symptoms  His colonoscopy was performed 04/2013. This revealed tubular adenomatous fragments. He's had tubular adenomatous tissue on 3 occasions. He has no active GI symptoms at this time.  He continues to smoke 2 bowls per day. He denies pulmonary symptoms.He would like to quit smoking.  CVE as calesthentics 7 X / week for 10 minutes.    Review of Systems   Chest pain, palpitations, tachycardia, exertional dyspnea, paroxysmal nocturnal dyspnea, claudication or edema are absent.  Unexplained weight loss, abdominal pain, significant dyspepsia, dysphagia, melena, rectal bleeding, or persistently small caliber stools are denied.     Objective:   Physical Exam Gen.: Thin but adequately nourished in appearance. Alert, appropriate and cooperative throughout exam.  Head: Normocephalic without obvious abnormalities;  pattern alopecia . Moustache & beard Eyes: No corneal or conjunctival inflammation noted. Pupils equal round reactive to light and accommodation. Extraocular motion intact.  Ears: External  ear exam reveals no significant lesions or deformities. Canals clear .TMs normal. Hearing is grossly normal bilaterally. Nose: External nasal exam reveals no deformity or inflammation. Nasal mucosa are pink and moist. No lesions or exudates noted.  Septum deviated to R Mouth: Very hoarse.Oral mucosa and oropharynx reveal no lesions or exudates. Teeth in good repair. Neck: No deformities, masses, or tenderness noted. Range of motion & Thyroid normal Chest: increased AP  diameter Lungs: Normal respiratory effort; chest expands symmetrically. Lungs are clear to auscultation without rales, wheezes, or increased work of breathing. Heart: Normal rate and rhythm. Normal S1 and S2. No gallop, click, or rub.  No murmur. Abdomen: Bowel sounds normal; abdomen soft and nontender. No masses, organomegaly or hernias noted. Genitalia: Genitalia normal except for left varices. Prostate is normal without enlargement, asymmetry, nodularity, or induration. External hemorrhoidal tissue.                               Musculoskeletal/extremities: Accentuated curvature of thoracic spine. No clubbing, cyanosis, edema, or significant extremity  deformity noted. Range of motion normal .Tone & strength normal. Hand joints normal Fingernail health good. Able to lie down & sit up w/o help. Negative SLR bilaterally Vascular: Carotid, radial artery, dorsalis pedis and  posterior tibial pulses are full and equal. No bruits present. Neurologic: Alert and oriented x3. Deep tendon reflexes symmetrical and normal.  Gait normal .       Skin: Intact without suspicious lesions or rashes. Lymph: No cervical, axillary, or inguinal lymphadenopathy present. Psych: Mood and affect are normal. Normally interactive  Assessment & Plan:  See Current Assessment & Plan in Problem List under specific Diagnosis

## 2014-04-29 DIAGNOSIS — H2513 Age-related nuclear cataract, bilateral: Secondary | ICD-10-CM | POA: Diagnosis not present

## 2014-04-29 DIAGNOSIS — H3531 Nonexudative age-related macular degeneration: Secondary | ICD-10-CM | POA: Diagnosis not present

## 2014-05-27 DIAGNOSIS — H53022 Refractive amblyopia, left eye: Secondary | ICD-10-CM | POA: Diagnosis not present

## 2014-05-27 DIAGNOSIS — H3531 Nonexudative age-related macular degeneration: Secondary | ICD-10-CM | POA: Diagnosis not present

## 2014-11-15 ENCOUNTER — Encounter: Payer: Self-pay | Admitting: Gastroenterology

## 2014-12-09 ENCOUNTER — Other Ambulatory Visit: Payer: Self-pay

## 2015-03-14 ENCOUNTER — Other Ambulatory Visit: Payer: Self-pay | Admitting: Internal Medicine

## 2015-03-18 ENCOUNTER — Other Ambulatory Visit: Payer: Self-pay | Admitting: Internal Medicine

## 2015-04-15 DIAGNOSIS — Z23 Encounter for immunization: Secondary | ICD-10-CM | POA: Diagnosis not present

## 2015-06-10 ENCOUNTER — Telehealth: Payer: Self-pay

## 2015-06-10 NOTE — Telephone Encounter (Signed)
Call to Mr. Hunter Boone Stated he was not at a place he can talk, but offered to call back to schedule for AWV. Has apt on Friday with [name] and will fup with AWV if [name] feels he can benefit.

## 2015-06-13 ENCOUNTER — Ambulatory Visit (INDEPENDENT_AMBULATORY_CARE_PROVIDER_SITE_OTHER): Payer: Medicare Other | Admitting: Internal Medicine

## 2015-06-13 ENCOUNTER — Encounter: Payer: Self-pay | Admitting: Internal Medicine

## 2015-06-13 VITALS — BP 140/80 | HR 72 | Wt 130.0 lb

## 2015-06-13 DIAGNOSIS — I10 Essential (primary) hypertension: Secondary | ICD-10-CM | POA: Diagnosis not present

## 2015-06-13 DIAGNOSIS — F17200 Nicotine dependence, unspecified, uncomplicated: Secondary | ICD-10-CM | POA: Diagnosis not present

## 2015-06-13 DIAGNOSIS — R7309 Other abnormal glucose: Secondary | ICD-10-CM

## 2015-06-13 DIAGNOSIS — Z86010 Personal history of colonic polyps: Secondary | ICD-10-CM

## 2015-06-13 MED ORDER — LOSARTAN POTASSIUM 100 MG PO TABS: 100.0000 mg | ORAL_TABLET | Freq: Every day | ORAL | Status: DC

## 2015-06-13 NOTE — Assessment & Plan Note (Signed)
CBC & dif 

## 2015-06-13 NOTE — Progress Notes (Signed)
Pre visit review using our clinic review tool, if applicable. No additional management support is needed unless otherwise documented below in the visit note. 

## 2015-06-13 NOTE — Patient Instructions (Signed)
  Your next office appointment will be determined based upon review of your pending labs  and  xrays  Those written interpretation of the lab results and instructions will be transmitted to you by My Chart   Critical results will be called.   Followup as needed for any active or acute issue. Please report any significant change in your symptoms.  Minimal Blood Pressure Goal= AVERAGE < 140/90;  Ideal is an AVERAGE < 135/85. This AVERAGE should be calculated from @ least 5-7 BP readings taken @ different times of day on different days of week. You should not respond to isolated BP readings , but rather the AVERAGE for that week .Please bring your  blood pressure cuff to office visits to verify that it is reliable.It  can also be checked against the blood pressure device at the pharmacy. Finger or wrist cuffs are not dependable; an arm cuff is. 

## 2015-06-13 NOTE — Assessment & Plan Note (Signed)
A1c

## 2015-06-13 NOTE — Progress Notes (Signed)
   Subjective:    Patient ID: Hunter Boone, male    DOB: June 15, 1945, 69 y.o.   MRN: [id number]  HPI The patient is here to assess status of active health conditions.  PMH, FH, & Social History reviewed & updated.No change in [place] as recorded.  He has been compliant with his medications without adverse effects. He is unsure of his blood pressure average. He is on a heart healthy diet. He exercises 10 minutes 7 days a week without cardio pulmonary symptoms.  He will have 3-4 alcoholic beverages per week. He smokes 3-4 bowls of tobacco daily. Risk was discussed with him.  Colonoscopy is up-to-date. He has no active GI symptoms.  He's never taken a statin; lipids values have been protective.  Review of systems is negative except for cold intolerance.  Review of Systems  Chest pain, palpitations, tachycardia, exertional dyspnea, paroxysmal nocturnal dyspnea, claudication or edema are absent. No unexplained weight loss, abdominal pain, significant dyspepsia, dysphagia, melena, rectal bleeding, or persistently small caliber stools. Dysuria, pyuria, hematuria, frequency, nocturia or polyuria are denied. Change in hair, skin, nails denied. No bowel changes of constipation or diarrhea. No intolerance to heat .     Objective:   Physical Exam Pertinent or positive findings include:He has bilateral ptosis. He has a mustache and beard. Pattern alopecia is present. He is hoarse. He has dramatic Raynaud's type phenomena of the fingers with erythema and coolness. Radial artery pulses are intact. Clubbing of the nailbeds suggested. He has crepitus of the knees. Genitourinary exam is unremarkable to include digital rectal exam. There is no prostatic hypertrophy.   General appearance :adequately nourished; in no distress.  Eyes: No conjunctival inflammation or scleral icterus is present.  Oral exam:  Lips and gums are healthy appearing.There is no oropharyngeal erythema or exudate noted. Dental hygiene  is good.  Heart:  Normal rate and regular rhythm. S1 and S2 normal without gallop, murmur, click, rub or other extra sounds    Lungs:Chest clear to auscultation; no wheezes, rhonchi,rales ,or rubs present.No increased work of breathing.   Abdomen: bowel sounds normal, soft and non-tender without masses, organomegaly or hernias noted.  No guarding or rebound. No flank tenderness to percussion.  Vascular : all pulses equal ; no bruits present.  Skin:Warm & dry.  Intact without suspicious lesions or rashes ; no tenting or jaundice   Lymphatic: No lymphadenopathy is noted about the head, neck, axilla, or inguinal areas.   Neuro: Strength, tone & DTRs normal.     Assessment & Plan:  See Current Assessment & Plan in Problem List under specific Diagnosis

## 2015-06-13 NOTE — Assessment & Plan Note (Signed)
Blood pressure goals reviewed. BMET 

## 2015-06-13 NOTE — Assessment & Plan Note (Signed)
Risk discussed 

## 2015-06-15 DIAGNOSIS — Z8709 Personal history of other diseases of the respiratory system: Secondary | ICD-10-CM

## 2015-06-15 HISTORY — DX: Personal history of other diseases of the respiratory system: Z87.09

## 2015-08-22 ENCOUNTER — Ambulatory Visit (INDEPENDENT_AMBULATORY_CARE_PROVIDER_SITE_OTHER): Payer: Medicare Other | Admitting: Nurse Practitioner

## 2015-08-22 ENCOUNTER — Encounter: Payer: Self-pay | Admitting: Nurse Practitioner

## 2015-08-22 VITALS — BP 138/80 | HR 66 | Temp 98.0°F | Ht 72.0 in | Wt 134.0 lb

## 2015-08-22 DIAGNOSIS — L42 Pityriasis rosea: Secondary | ICD-10-CM

## 2015-08-22 NOTE — Progress Notes (Signed)
Patient ID: Hunter Boone, male    DOB: 26-Jul-1945  Age: 70 y.o. MRN: [id number]  CC: No chief complaint on file.   HPI Hunter Boone presents for CC of Rash x 1 week.  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   He has past surgical history that includes adenomatous polyps (2009, 2011,2014).   His family history includes Diabetes in his father and mother; Heart attack in his father; Liver cancer in his maternal aunt. There is no history of Stroke, COPD, Colon cancer, Esophageal cancer, Rectal cancer, Stomach cancer, or Asthma.He reports that he has been smoking Cigarettes and Pipe.  He has been smoking about 0.50 packs per day. He has never used smokeless tobacco. He reports that he drinks about 4.2 oz of alcohol per week. He reports that he does not use illicit drugs.  Outpatient Prescriptions Prior to Visit  Medication Sig Dispense Refill  . aspirin 81 MG tablet Take 81 mg by mouth daily.      . hydrocortisone 2.5 % cream Apply 1 application topically 3 (three) times daily as needed.     [name] losartan (COZAAR) 100 MG tablet Take 1 tablet (100 mg total) by mouth daily. 90 tablet 3  . LUTEIN PO Take by mouth daily.      . Multiple Vitamins-Minerals (CENTRUM PO) Take by mouth daily.      . Multiple Vitamins-Minerals (PRESERVISION AREDS 2 PO) Take by mouth. 4 by mouth daily     . Omega-3 Fatty Acids (FISH OIL) 1200 MG CAPS Take by mouth daily.      . Saw Palmetto 450 MG CAPS Take by mouth. 2am, 2 pm      No facility-administered medications prior to visit.    ROS Review of Systems  Constitutional: Negative for fever, chills, diaphoresis and fatigue.  HENT: Negative for trouble swallowing.   Skin:  Positive for rash.    Objective:  BP 138/80 mmHg  Pulse 66  Temp(Src) 98 F (36.7 C) (Oral)  Ht 6' (1.829 m)  Wt 134 lb (60.782 kg)  BMI 18.17 kg/m2  SpO2 99%  Physical Exam  Constitutional: He is oriented to person, place, and time. He appears well-developed and well-nourished. No distress.  HENT:  Head: Normocephalic and atraumatic.  Right Ear: External ear normal.  Left Ear: External ear normal.  Cardiovascular: Normal rate, regular rhythm and normal heart sounds.  Exam reveals no gallop and no friction rub.   No murmur heard. Pulmonary/Chest: Effort normal and breath sounds normal. No respiratory distress. He has no wheezes. He has no rales. He exhibits no tenderness.  Neurological: He is alert and oriented to person, place, and time.  Skin: Skin is warm and dry. Rash noted. He is not diaphoretic.     Oval pink areas scattered on trunk and arms (didn't look at legs)  Psychiatric: He has a normal mood and affect. His behavior is normal. Judgment and thought content normal.   Assessment & Plan:   Diagnoses and all orders for this visit:  Pityriasis rosea  I am having Hunter Boone maintain his aspirin, Multiple Vitamins-Minerals (CENTRUM PO), Fish Oil, hydrocortisone, Multiple Vitamins-Minerals (PRESERVISION AREDS 2 PO), Saw Palmetto, LUTEIN PO,  and losartan.  No orders of the defined types were placed in this encounter.     Follow-up: Return if symptoms worsen or fail to improve.

## 2015-08-22 NOTE — Patient Instructions (Signed)
Use OTC benadryl and/or hydrocortisone cream for itching if bothersome  Pityriasis Rosea Pityriasis rosea is a rash that usually appears on the trunk of the body. It may also appear on the upper arms and upper legs. It usually begins as a single patch, and then more patches begin to develop. The rash may cause mild itching, but it normally does not cause other problems. It usually goes away without treatment. However, it may take weeks or months for the rash to go away completely. CAUSES The cause of this condition is not known. The condition does not spread from person to person (is noncontagious). RISK FACTORS This condition is more likely to develop in young adults and children. It is most common in the spring and fall. SYMPTOMS The main symptom of this condition is a rash.  The rash usually begins with a single oval patch that is larger than the ones that follow. This is called a herald patch. It generally appears a week or more before the rest of the rash appears.  When more patches start to develop, they spread quickly on the trunk, back, and arms. These patches are smaller than the first one.  The patches that make up the rash are usually oval-shaped and pink or red in color. They are usually flat, but they may sometimes be raised so that they can be felt with a finger. They may also be finely crinkled and have a scaly ring around the edge.  The rash does not typically appear on areas of the skin that are exposed to the sun. Most people who have this condition do not have other symptoms, but some have mild itching. In a few cases, a mild headache or body aches may occur before the rash appears and then go away. DIAGNOSIS Your health care provider may diagnose this condition by doing a physical exam and taking your medical history. To rule out other possible causes for the rash, the health care provider may order blood tests or take a skin sample from the rash to be looked at under a  microscope. TREATMENT Usually, treatment is not needed for this condition. The rash will probably go away on its own in 4-8 weeks. In some cases, a health care provider may recommend or prescribe medicine to reduce itching. HOME CARE INSTRUCTIONS  Take medicines only as directed by your health care provider.  Avoid scratching the affected areas of skin.  Do not take hot baths or use a sauna. Use only warm water when bathing or showering. Heat can increase itching. SEEK MEDICAL CARE IF:  Your rash does not go away in 8 weeks.  Your rash gets much worse.  You have a fever.  You have swelling or pain in the rash area.  You have fluid, blood, or pus coming from the rash area.   This information is not intended to replace advice given to you by your health care provider. Make sure you discuss any questions you have with your health care provider.   Document Released: 07/07/2001 Document Revised: 10/15/2014 Document Reviewed: 05/08/2014 Elsevier Interactive Patient Education [employer].

## 2015-08-22 NOTE — Progress Notes (Signed)
Pre visit review using our clinic review tool, if applicable. No additional management support is needed unless otherwise documented below in the visit note. 

## 2015-08-22 NOTE — Assessment & Plan Note (Signed)
New onset x 1 week OTC measures for supportive care on AVS Handout on AVS about what it is and how long it stays Highlighted when to seek care FU prn worsening/failure to improve.

## 2016-03-08 ENCOUNTER — Telehealth: Payer: Self-pay

## 2016-03-08 NOTE — Telephone Encounter (Signed)
error 

## 2016-03-09 ENCOUNTER — Ambulatory Visit (INDEPENDENT_AMBULATORY_CARE_PROVIDER_SITE_OTHER): Payer: Medicare Other | Admitting: Nurse Practitioner

## 2016-03-09 ENCOUNTER — Encounter: Payer: Self-pay | Admitting: Nurse Practitioner

## 2016-03-09 VITALS — BP 148/72 | HR 88 | Temp 97.6°F | Ht 72.0 in | Wt 141.0 lb

## 2016-03-09 DIAGNOSIS — L0100 Impetigo, unspecified: Secondary | ICD-10-CM

## 2016-03-09 DIAGNOSIS — Z23 Encounter for immunization: Secondary | ICD-10-CM | POA: Diagnosis not present

## 2016-03-09 MED ORDER — MUPIROCIN 2 % EX OINT: 1.0000 "application " | TOPICAL_OINTMENT | Freq: Two times a day (BID) | CUTANEOUS | 0 refills | Status: DC

## 2016-03-09 MED ORDER — SULFAMETHOXAZOLE-TRIMETHOPRIM 800-160 MG PO TABS: 1.0000 | ORAL_TABLET | Freq: Two times a day (BID) | ORAL | 0 refills | Status: DC

## 2016-03-09 NOTE — Patient Instructions (Signed)
Impetigo, Adult Impetigo is an infection of the skin. It commonly occurs in young children, but it can also occur in adults. The infection causes itchy blisters and sores that produce brownish-yellow fluid. As the fluid dries, it forms a thick, honey-colored crust. These skin changes usually occur on the face but can also affect other areas of the body. Impetigo usually goes away in 7-10 days with treatment. CAUSES Impetigo is caused by two types of bacteria. It may be caused by staphylococci or streptococci bacteria. These bacteria cause impetigo when they get under the surface of the skin. This often happens after some damage to the skin, such as damage from:  Cuts, scrapes, or scratches.  Insect bites, especially when you scratch the area of a bite.  Chickenpox or other illnesses that cause open skin sores.  Nail biting or chewing. Impetigo is contagious and can spread easily from one person to another. This may occur through close skin contact or by sharing towels, clothing, or other items with a person who has the infection. RISK FACTORS Some things that can increase the risk of getting this infection include:  Playing sports that include skin-to-skin contact with others.  Having a skin condition with open sores.  Having many skin cuts or scrapes.  Living in an area that has high humidity levels.  Having poor hygiene.  Having high levels of staphylococci in your nose. SIGNS AND SYMPTOMS Impetigo usually starts out as small blisters, often on the face. The blisters then break open and turn into tiny sores (lesions) with a yellow crust. In some cases, the blisters cause itching or burning. With scratching, irritation, or lack of treatment, these small lesions may get larger. Scratching can also cause impetigo to spread to other parts of the body. The bacteria can get under the fingernails and spread when you touch another area of your skin. Other possible symptoms include:  Larger  blisters.  Pus.  Swollen lymph glands. DIAGNOSIS This condition is usually diagnosed during a physical exam. A skin sample or sample of fluid from a blister may be taken for lab tests that involve growing bacteria (culture test). This can help confirm the diagnosis or help determine the best treatment. TREATMENT Mild impetigo can be treated with prescription antibiotic cream. Oral antibiotic medicine may be used in more severe cases. Medicines for itching may also be used. HOME CARE INSTRUCTIONS  Take medicines only as directed by your health care provider.  To help prevent impetigo from spreading to other body areas:  Keep your fingernails short and clean.  Do not scratch the blisters or sores.  Cover infected areas, if necessary, to keep from scratching.  Gently wash the infected areas with antibiotic soap and water.  Soak crusted areas in warm, soapy water using antibiotic soap.  Gently rub the areas to remove crusts. Do not scrub.  Wash your hands often to avoid spreading this infection.  Stay home until you have used an antibiotic cream for 48 hours (2 days) or an oral antibiotic medicine for 24 hours (1 day). You should only return to work and activities with other people if your skin shows significant improvement. PREVENTION  To keep the infection from spreading:  Stay home until you have used an antibiotic cream for 48 hours or an oral antibiotic for 24 hours.  Wash your hands often.  Do not engage in skin-to-skin contact with other people while you have still have blisters.  Do not share towels, washcloths, or bedding with others   while you have the infection. SEEK MEDICAL CARE IF:  You develop more blisters or sores despite treatment.  Other family members get sores.  Your skin sores are not improving after 48 hours of treatment.  You have a fever. SEEK IMMEDIATE MEDICAL CARE IF:  You see spreading redness or swelling of the skin around your sores.  You  see red streaks coming from your sores.  You develop a sore throat.   This information is not intended to replace advice given to you by your health care provider. Make sure you discuss any questions you have with your health care provider.   Document Released: 06/21/2014 Document Reviewed: 06/21/2014 Elsevier Interactive Patient Education [employer].

## 2016-03-09 NOTE — Progress Notes (Signed)
Subjective:  Patient ID: Hunter Boone, male    DOB: 07/19/1945  Age: 70 y.o. MRN: [id number]  CC: Rash (Pt stated  underneath his nose have rash/redness and cold for about 1 week.)   Rash  This is a new problem. The current episode started in the past 7 days. The problem has been gradually worsening since onset. Location: upper lip. The rash is characterized by redness, scaling and dryness. He was exposed to nothing. Pertinent negatives include no facial edema, fever or sore throat. Past treatments include anti-itch cream and antibiotic cream. The treatment provided no relief. There is no history of allergies or varicella.    Outpatient Medications Prior to Visit  Medication Sig Dispense Refill  . aspirin 81 MG tablet Take 81 mg by mouth daily.      . hydrocortisone 2.5 % cream Apply 1 application topically 3 (three) times daily as needed.     [name] losartan (COZAAR) 100 MG tablet Take 1 tablet (100 mg total) by mouth daily. 90 tablet 3  . LUTEIN PO Take by mouth daily.      . Multiple Vitamins-Minerals (CENTRUM PO) Take by mouth daily.      . Multiple Vitamins-Minerals (PRESERVISION AREDS 2 PO) Take by mouth. 4 by mouth daily     . Omega-3 Fatty Acids (FISH OIL) 1200 MG CAPS Take by mouth daily.      . Saw Palmetto 450 MG CAPS Take by mouth. 2am, 2 pm      No facility-administered medications prior to visit.     ROS See HPI  Objective:  BP (!) 148/72 (BP Location: Left Arm, Patient Position: Sitting, Cuff Size: Normal)   Pulse 88   Temp 97.6 F (36.4 C)   Ht 6' (1.829 m)   Wt 141 lb (64 kg)   SpO2 98%   BMI 19.12 kg/m   BP Readings from Last 3 Encounters:  03/09/16 (!) 148/72  08/22/15 138/80  06/13/15 140/80    Wt Readings from Last 3 Encounters:  03/09/16 141 lb (64 kg)  08/22/15 134 lb (60.8 kg)  06/13/15 130 lb (59 kg)    Physical Exam  Constitutional: He is oriented to person, place, and time. No distress.  HENT:  Head:    Yellow crusty erythematous rash  on upper lip. Normal oral and nasal mucosa. No cervical or submental lymphadenopathy.  Eyes: No scleral icterus.  Neck: Normal range of motion. Neck supple.  Cardiovascular: Normal rate.   Pulmonary/Chest: Effort normal.  Neurological: He is alert and oriented to person, place, and time.  Skin: Skin is warm and dry.  Psychiatric: He has a normal mood and affect. His behavior is normal.  Vitals reviewed.   Lab Results  Component Value Date   WBC 10.9 (H) 03/12/2014   HGB 14.6 03/12/2014   HCT 42.6 03/12/2014   PLT 338.0 03/12/2014   GLUCOSE 95 03/12/2014   CHOL 157 10/25/2012   TRIG 101.0 10/25/2012   HDL 60.30 10/25/2012   LDLCALC 77 10/25/2012   ALT 22 10/25/2012   AST 26 10/25/2012   NA 134 (L) 03/12/2014   K 4.3 03/12/2014   CL 102 03/12/2014   CREATININE 0.7 03/12/2014   BUN 10 03/12/2014   CO2 26 03/12/2014   TSH 0.77 10/25/2012   PSA 2.45 08/05/2011   HGBA1C 5.8 03/12/2014    Assessment & Plan:   Hunter Boone was seen today for rash.  Diagnoses and all orders for this visit:  Impetigo -  mupirocin ointment (BACTROBAN) 2 %; Place 1 application into the nose 2 (two) times daily. -     sulfamethoxazole-trimethoprim (BACTRIM DS,SEPTRA DS) 800-160 MG tablet; Take 1 tablet by mouth 2 (two) times daily. With food   I am having Hunter Boone start on mupirocin ointment and sulfamethoxazole-trimethoprim. I am also having him maintain his aspirin, Multiple Vitamins-Minerals (CENTRUM PO), Fish Oil, hydrocortisone, Multiple Vitamins-Minerals (PRESERVISION AREDS 2 PO), Saw Palmetto, LUTEIN PO, and losartan.  Meds ordered this encounter  Medications  . mupirocin ointment (BACTROBAN) 2 %    Sig: Place 1 application into the nose 2 (two) times daily.    Dispense:  22 g    Refill:  0    Order Specific Question:   Supervising Provider    Answer:   Cassandria Anger [[id number]]  . sulfamethoxazole-trimethoprim (BACTRIM DS,SEPTRA DS) 800-160 MG tablet    Sig: Take 1 tablet by  mouth 2 (two) times daily. With food    Dispense:  14 tablet    Refill:  0    Order Specific Question:   Supervising Provider    Answer:   Cassandria Anger [[id number]]    Follow-up: Return if symptoms worsen or fail to improve.  Wilfred Lacy, NP

## 2016-03-09 NOTE — Progress Notes (Signed)
Pre visit review using our clinic review tool, if applicable. No additional management support is needed unless otherwise documented below in the visit note. 

## 2016-04-02 ENCOUNTER — Encounter: Payer: Self-pay | Admitting: Gastroenterology

## 2016-04-15 ENCOUNTER — Encounter: Payer: Self-pay | Admitting: Physician Assistant

## 2016-04-15 ENCOUNTER — Ambulatory Visit (INDEPENDENT_AMBULATORY_CARE_PROVIDER_SITE_OTHER): Payer: Medicare Other | Admitting: Physician Assistant

## 2016-04-15 VITALS — BP 142/86 | HR 80 | Ht 69.0 in | Wt 133.0 lb

## 2016-04-15 DIAGNOSIS — Z1211 Encounter for screening for malignant neoplasm of colon: Secondary | ICD-10-CM

## 2016-04-15 DIAGNOSIS — K625 Hemorrhage of anus and rectum: Secondary | ICD-10-CM

## 2016-04-15 DIAGNOSIS — Z86010 Personal history of colonic polyps: Secondary | ICD-10-CM

## 2016-04-15 DIAGNOSIS — K6289 Other specified diseases of anus and rectum: Secondary | ICD-10-CM

## 2016-04-15 DIAGNOSIS — Z1212 Encounter for screening for malignant neoplasm of rectum: Secondary | ICD-10-CM

## 2016-04-15 MED ORDER — NA SULFATE-K SULFATE-MG SULF 17.5-3.13-1.6 GM/177ML PO SOLN: ORAL | 0 refills | Status: DC

## 2016-04-15 MED ORDER — HYDROCORTISONE 2.5 % RE CREA: 1.0000 "application " | TOPICAL_CREAM | Freq: Two times a day (BID) | RECTAL | 0 refills | Status: DC

## 2016-04-15 NOTE — Progress Notes (Signed)
Chief Complaint: Rectal Bleeding  HPI:    Hunter Boone is a 70 year old Caucasian male, with past medical history of hypertension,  who was referred to me by Hendricks Limes, MD for a complaint of rectal bleeding. Patient follows with Dr. Fuller Plan.    Per chart review patient's last colonoscopy was performed on 04/24/2013 for a personal history of adenomatous colon polyps. Independent review of colonoscopy report and images shows 3 sessile polyps measuring 5-7 mm at the cecum, sessile polyp measuring 7 mm in the transverse colon, sessile polyp measuring 3 mm in the sigmoid colon, moderate diverticulosis in the descending and sigmoid colon and moderate internal hemorrhoids. Pathology revealed multiple fragments of tubular adenomas negative for high-grade dysplasia . Repeat was recommended in 3 years.   Today, the patient presents to clinic and tells me that 2 months ago he started seeing some bright red blood with his bowel movements, he tells me this occurred 4-5 times, but he has not seen any in the past 3-4 weeks. Every time he saw bright red blood it was with a bowel movement and when wiping afterwards. The patient tells me he also had some rectal irritation and "pain" initially with this but started using some over-the-counter Preparation H cream which has helped a little. Now it is more of an "awareness when I'm sitting". The patient denies any change in diet, medication, bowel habits or exercise levels over the past couple of months.  Patient denies fever, chills, weight loss, fatigue, nausea, vomiting, heartburn, reflux or abdominal pain.  Past Medical History:  Diagnosis Date  . Hypertension   . Macular degeneration     Past Surgical History:  Procedure Laterality Date  . adenomatous polyps  2009, 2011,2014    Dr Fuller Plan;     Current Outpatient Prescriptions  Medication Sig Dispense Refill  . aspirin 81 MG tablet Take 81 mg by mouth daily.      . hydrocortisone 2.5 % cream Apply 1  application topically 3 (three) times daily as needed.     [name] losartan (COZAAR) 100 MG tablet Take 1 tablet (100 mg total) by mouth daily. 90 tablet 3  . LUTEIN PO Take by mouth daily.      . Multiple Vitamins-Minerals (CENTRUM PO) Take by mouth daily.      . Multiple Vitamins-Minerals (PRESERVISION AREDS 2 PO) Take by mouth. 4 by mouth daily     . mupirocin ointment (BACTROBAN) 2 % Place 1 application into the nose 2 (two) times daily. 22 g 0  . Omega-3 Fatty Acids (FISH OIL) 1200 MG CAPS Take by mouth daily.      . Saw Palmetto 450 MG CAPS Take by mouth. 2am, 2 pm     . sulfamethoxazole-trimethoprim (BACTRIM DS,SEPTRA DS) 800-160 MG tablet Take 1 tablet by mouth 2 (two) times daily. With food 14 tablet 0   No current facility-administered medications for this visit.     Allergies as of 04/15/2016  . (No Known Allergies)    Family History  Problem Relation Age of Onset  . Heart attack Father     early 74s  . Diabetes Father   . Diabetes Mother   . Liver cancer Maternal Aunt   . Stroke Neg Hx   . COPD Neg Hx   . Colon cancer Neg Hx   . Esophageal cancer Neg Hx   . Rectal cancer Neg Hx   . Stomach cancer Neg Hx   . Asthma Neg Hx  Social History   Social History  . Marital status: Married    Spouse name: N/A  . Number of children: N/A  . Years of education: N/A   Occupational History  . Not on file.   Social History Main Topics  . Smoking status: Current Some Day Smoker    Packs/day: 0.50    Types: Cigarettes, Pipe  . Smokeless tobacco: Never Used     Comment: Presently 2 bowls / day; smoked cigarettes age [age] , up to 1 ppd  . Alcohol use 4.2 oz/week    7 Shots of liquor per week  . Drug use: No  . Sexual activity: Not on file   Other Topics Concern  . Not on file   Social History Narrative  . No narrative on file    Review of Systems:     Constitutional: No weight loss, fever, chills, weakness or fatigue HEENT: Eyes: No change in vision                Ears, Nose, Throat:  No change in hearing or congestion Skin: No rash or itching Cardiovascular: No chest pain, chest pressure or palpitations   Respiratory: No SOB or cough Gastrointestinal: See HPI and otherwise negative Genitourinary: No dysuria or change in urinary frequency Neurological: No headache, dizziness or syncope Musculoskeletal: No new muscle or joint pain Hematologic: No bruising Psychiatric: No history of depression or anxiety    Physical Exam:  Vital signs: Ht 5\' 9"  (1.753 m) Comment: height measured without shoes  Wt 133 lb (60.3 kg)   BMI 19.64 kg/m   General:   Pleasant Caucasian male appears to be in NAD, Well developed, Well nourished, alert and cooperative Head:  Normocephalic and atraumatic. Eyes:   PEERL, EOMI. No icterus. Conjunctiva pink. Ears:  Normal auditory acuity. Neck:  Supple Throat: Oral cavity and pharynx without inflammation, swelling or lesion.  Lungs: Respirations even and unlabored. Lungs clear to auscultation bilaterally.   No wheezes, crackles, or rhonchi.  Heart: Normal S1, S2. No MRG. Regular rate and rhythm. No peripheral edema, cyanosis or pallor.  Abdomen:  Soft, nondistended, nontender. No rebound or guarding. Normal bowel sounds. No appreciable masses or hepatomegaly. Rectal:  External: Multiple external hemorrhoids, no mass or fissure observed; internal: Rectal fullness, no tenderness or mass Msk:  Symmetrical without gross deformities. Extremities:  Without edema, no deformity or joint abnormality. Normal ROM. Neurologic:  Alert and  oriented x4;  grossly normal neurologically.  Skin:   Dry and intact without significant lesions or rashes. Psychiatric: Oriented to person, place and time. Demonstrates good judgement and reason without abnormal affect or behaviors.  No recent labs or imaging.  Assessment: 1. Rectal Bleeding: Patient describes 4-5 episodes which occurred 2 months ago, he has not seen any further blood in the past  3-4 weeks, known history of hemorrhoids, some relief of rectal irritation with over-the-counter Preparation H cream; most likely this represents hemorrhoidal bleeding as as it has been very intermittent and only with bowel movements 2. Personal history of adenomatous polyps: Last colonoscopy 04/2013 recommendations for repeat in 3 years, patient is due now 3. Rectal Irritation: Some better with Preparation H cream, now more of an "awareness when he is sitting", likely due to hemorrhoids  Plan: 1. Prescribed Anusol HC ointment to be applied to the outside and inside of rectum 3 times a day 7-14 days 2. Recommend the patient proceed with a screening colonoscopy at this time. Discussed risks, benefits, limitations and alternatives and  the patient agrees to proceed. This was scheduled with Dr. Fuller Plan at the [facility] 3. Patient to follow in clinic per Dr. Lynne Leader recommendations after time of procedure.  Hunter Newer, PA-C [facility] 04/15/2016, 1:13 PM  Cc: Hendricks Limes, MD

## 2016-04-15 NOTE — Progress Notes (Signed)
Reviewed and agree with management plan.  Rosalina Dingwall T. Cohan Stipes, MD FACG 

## 2016-04-15 NOTE — Patient Instructions (Signed)
  You have been scheduled for a colonoscopy. Please follow written instructions given to you at your visit today.  Please pick up your prep supplies at the pharmacy within the next 1-3 days. If you use inhalers (even only as needed), please bring them with you on the day of your procedure. Your physician has requested that you go to www.startemmi.com and enter the access code given to you at your visit today. This web site gives a general overview about your procedure. However, you should still follow specific instructions given to you by our office regarding your preparation for the procedure.   We have sent the following medications to your pharmacy for you to pick up at your convenience: [facility], use for 7-14 days.   I appreciate the opportunity to care for you. [name], PA-C

## 2016-04-20 ENCOUNTER — Telehealth: Payer: Self-pay | Admitting: Internal Medicine

## 2016-04-20 ENCOUNTER — Ambulatory Visit (AMBULATORY_SURGERY_CENTER): Payer: Medicare Other | Admitting: Gastroenterology

## 2016-04-20 ENCOUNTER — Encounter: Payer: Self-pay | Admitting: Gastroenterology

## 2016-04-20 VITALS — BP 135/69 | HR 72 | Temp 96.0°F | Resp 17 | Ht 69.0 in | Wt 133.0 lb

## 2016-04-20 DIAGNOSIS — I10 Essential (primary) hypertension: Secondary | ICD-10-CM | POA: Diagnosis not present

## 2016-04-20 DIAGNOSIS — D125 Benign neoplasm of sigmoid colon: Secondary | ICD-10-CM | POA: Diagnosis not present

## 2016-04-20 DIAGNOSIS — D120 Benign neoplasm of cecum: Secondary | ICD-10-CM | POA: Diagnosis not present

## 2016-04-20 DIAGNOSIS — K625 Hemorrhage of anus and rectum: Secondary | ICD-10-CM | POA: Diagnosis not present

## 2016-04-20 DIAGNOSIS — Z86010 Personal history of colonic polyps: Secondary | ICD-10-CM | POA: Diagnosis not present

## 2016-04-20 DIAGNOSIS — D126 Benign neoplasm of colon, unspecified: Secondary | ICD-10-CM

## 2016-04-20 DIAGNOSIS — K635 Polyp of colon: Secondary | ICD-10-CM | POA: Diagnosis not present

## 2016-04-20 MED ORDER — SODIUM CHLORIDE 0.9 % IV SOLN: 500.0000 mL | INTRAVENOUS | Status: DC

## 2016-04-20 NOTE — Progress Notes (Signed)
Called to room to assist during endoscopic procedure.  Patient ID and intended procedure confirmed with present staff. Received instructions for my participation in the procedure from the performing physician.  

## 2016-04-20 NOTE — Telephone Encounter (Signed)
Called left vm to advise that patient needs to establish with new pcp

## 2016-04-20 NOTE — Patient Instructions (Signed)
YOU HAD AN ENDOSCOPIC PROCEDURE TODAY AT THE [facility]:   Refer to the procedure report that was given to you for any specific questions about what was found during the examination.  If the procedure report does not answer your questions, please call your gastroenterologist to clarify.  If you requested that your care partner not be given the details of your procedure findings, then the procedure report has been included in a sealed envelope for you to review at your convenience later.  YOU SHOULD EXPECT: Some feelings of bloating in the abdomen. Passage of more gas than usual.  Walking can help get rid of the air that was put into your GI tract during the procedure and reduce the bloating. If you had a lower endoscopy (such as a colonoscopy or flexible sigmoidoscopy) you may notice spotting of blood in your stool or on the toilet paper. If you underwent a bowel prep for your procedure, you may not have a normal bowel movement for a few days.  Please Note:  You might notice some irritation and congestion in your nose or some drainage.  This is from the oxygen used during your procedure.  There is no need for concern and it should clear up in a day or so.  SYMPTOMS TO REPORT IMMEDIATELY:   Following lower endoscopy (colonoscopy or flexible sigmoidoscopy):  Excessive amounts of blood in the stool  Significant tenderness or worsening of abdominal pains  Swelling of the abdomen that is new, acute  Fever of 100F or higher   For urgent or emergent issues, a gastroenterologist can be reached at any hour by calling [phone].   DIET:  We do recommend a small meal at first, but then you may proceed to your regular diet.  Drink plenty of fluids but you should avoid alcoholic beverages for 24 hours. Try to increase the fiber in your diet, and drink plenty of water.  ACTIVITY:  You should plan to take it easy for the rest of today and you should NOT DRIVE or use heavy machinery until  tomorrow (because of the sedation medicines used during the test).    FOLLOW UP: Our staff will call the number listed on your records the next business day following your procedure to check on you and address any questions or concerns that you may have regarding the information given to you following your procedure. If we do not reach you, we will leave a message.  However, if you are feeling well and you are not experiencing any problems, there is no need to return our call.  We will assume that you have returned to your regular daily activities without incident.  If any biopsies were taken you will be contacted by phone or by letter within the next 1-3 weeks.  Please call us at [phone] if you have not heard about the biopsies in 3 weeks.    SIGNATURES/CONFIDENTIALITY: You and/or your care partner have signed paperwork which will be entered into your electronic medical record.  These signatures attest to the fact that that the information above on your After Visit Summary has been reviewed and is understood.  Full responsibility of the confidentiality of this discharge information lies with you and/or your care-partner.  Read all of the handouts given to you by your recovery room nurse.  Thank-you for choosing us for your healthcare needs today. 

## 2016-04-20 NOTE — Op Note (Signed)
[facility] Patient Name: Hunter Boone Procedure Date: 04/20/2016 2:03 PM MRN: [id number] Endoscopist: Ladene Artist , MD Age: 70 Referring MD:  Date of Birth: 1945/10/27 Gender: Male Account #: [id number] Procedure:                Colonoscopy Indications:              Hematochezia, personal history of adenomatous colon                            polyps Medicines:                Monitored Anesthesia Care Procedure:                Pre-Anesthesia Assessment:                           - Prior to the procedure, a History and Physical                            was performed, and patient medications and                            allergies were reviewed. The patient's tolerance of                            previous anesthesia was also reviewed. The risks                            and benefits of the procedure and the sedation                            options and risks were discussed with the patient.                            All questions were answered, and informed consent                            was obtained. Prior Anticoagulants: The patient has                            taken no previous anticoagulant or antiplatelet                            agents. ASA Grade Assessment: II - A patient with                            mild systemic disease. After reviewing the risks                            and benefits, the patient was deemed in                            satisfactory condition to undergo the procedure.  After obtaining informed consent, the colonoscope                            was passed under direct vision. Throughout the                            procedure, the patient's blood pressure, pulse, and                            oxygen saturations were monitored continuously. The                            Model PCF-H190DL [id number]) scope was introduced                            through the anus and advanced to the the cecum,                          identified by appendiceal orifice and ileocecal                            valve. The ileocecal valve, appendiceal orifice,                            and rectum were photographed. The quality of the                            bowel preparation was excellent. The colonoscopy                            was performed without difficulty. The patient                            tolerated the procedure well. Scope In: 2:24:07 PM Scope Out: 2:38:48 PM Scope Withdrawal Time: 0 hours 11 minutes 17 seconds  Total Procedure Duration: 0 hours 14 minutes 41 seconds  Findings:                 The perianal exam findings include non-thrombosed                            external hemorrhoids.                           A 6 mm polyp was found in the sigmoid colon. The                            polyp was sessile. The polyp was removed with a                            cold snare. Resection and retrieval were complete.                           Internal hemorrhoids were found during  retroflexion. The hemorrhoids were medium-sized and                            Grade I (internal hemorrhoids that do not prolapse).                           Two sessile polyps were found in the sigmoid colon                            and cecum. The polyps were 4 to 5 mm in size. These                            polyps were removed with a cold biopsy forceps.                            Resection was complete, and retrieval was complete.                           Multiple medium-mouthed diverticula were found in                            the sigmoid colon and descending colon. There was                            no evidence of diverticular bleeding.                           The exam was otherwise without abnormality on                            direct and retroflexion views. Complications:            No immediate complications. Estimated blood loss:                             None. Estimated Blood Loss:     Estimated blood loss: none. Impression:               - Non-thrombosed external hemorrhoids found on                            perianal exam.                           - One 6 mm polyp in the sigmoid colon, removed with                            a cold snare. Resected and retrieved.                           - Internal and external hemorrhoids.                           - Two 4 to 5 mm polyps in the sigmoid colon and in  the cecum, removed with a cold biopsy forceps.                            Resected and retrieved.                           - Moderate diverticulosis in the sigmoid colon and                            in the descending colon. There was no evidence of                            diverticular bleeding.                           - The examination was otherwise normal on direct                            and retroflexion views. Recommendation:           - Repeat colonoscopy in 5 years for surveillance.                           - Patient has a contact number available for                            emergencies. The signs and symptoms of potential                            delayed complications were discussed with the                            patient. Return to normal activities tomorrow.                            Written discharge instructions were provided to the                            patient.                           - High fiber diet.                           - Continue present medications.                           - Await pathology results. Ladene Artist, MD 04/20/2016 2:46:29 PM This report has been signed electronically.

## 2016-04-20 NOTE — Progress Notes (Signed)
Report to PACU, RN, vss, BBS= Clear.  

## 2016-04-21 ENCOUNTER — Telehealth: Payer: Self-pay | Admitting: *Deleted

## 2016-04-21 ENCOUNTER — Telehealth: Payer: Self-pay

## 2016-04-21 NOTE — Telephone Encounter (Signed)
  Follow up Call-  Call back number 04/20/2016  Post procedure Call Back phone  # [phone]  Permission to leave phone message Yes  Some recent data might be hidden    Patient expressed how grateful that he was for the professional care that he received while in the [facility].   Patient questions:  Do you have a fever, pain , or abdominal swelling? No. Pain Score  0 *  Have you tolerated food without any problems? Yes.    Have you been able to return to your normal activities? Yes.    Do you have any questions about your discharge instructions: Diet   No. Medications  No. Follow up visit  No.  Do you have questions or concerns about your Care? No.  Actions: * If pain score is 4 or above: No action needed, pain <4.

## 2016-04-21 NOTE — Telephone Encounter (Signed)
No answer, left message to call if questions or concerns. 

## 2016-04-23 ENCOUNTER — Telehealth: Payer: Self-pay | Admitting: Gastroenterology

## 2016-04-26 ENCOUNTER — Encounter: Payer: Self-pay | Admitting: Gastroenterology

## 2016-04-26 ENCOUNTER — Encounter: Payer: Self-pay | Admitting: *Deleted

## 2016-04-26 ENCOUNTER — Other Ambulatory Visit: Payer: Self-pay

## 2016-04-26 DIAGNOSIS — Z1211 Encounter for screening for malignant neoplasm of colon: Secondary | ICD-10-CM

## 2016-04-26 NOTE — Telephone Encounter (Signed)
Patient is scheduled for hemorrhoid banding #1 with Dr. Hilarie Fredrickson.  No blood thinners

## 2016-04-26 NOTE — Telephone Encounter (Signed)
Is it ok to schedule him with one of the banding MDs?

## 2016-04-26 NOTE — Telephone Encounter (Signed)
Yes, please schedule for hemorrhoid banding

## 2016-04-26 NOTE — Telephone Encounter (Signed)
Left message for patient to call back  

## 2016-05-14 ENCOUNTER — Ambulatory Visit (INDEPENDENT_AMBULATORY_CARE_PROVIDER_SITE_OTHER): Payer: Medicare Other | Admitting: Internal Medicine

## 2016-05-14 ENCOUNTER — Encounter: Payer: Self-pay | Admitting: Internal Medicine

## 2016-05-14 VITALS — BP 142/90 | HR 68 | Temp 99.0°F | Ht 69.0 in | Wt 134.0 lb

## 2016-05-14 DIAGNOSIS — K648 Other hemorrhoids: Secondary | ICD-10-CM

## 2016-05-14 NOTE — Patient Instructions (Signed)
You have been scheduled for your 2nd hemorrhoidal banding on 07/15/16 @ 3:30 pm.  You are scheduled for your 3rd hemorrhoidal banding on 08/06/16 @ 3:30 pm.  HEMORRHOID BANDING PROCEDURE    FOLLOW-UP CARE   1. The procedure you have had should have been relatively painless since the banding of the area involved does not have nerve endings and there is no pain sensation.  The rubber band cuts off the blood supply to the hemorrhoid and the band may fall off as soon as 48 hours after the banding (the band may occasionally be seen in the toilet bowl following a bowel movement). You may notice a temporary feeling of fullness in the rectum which should respond adequately to plain Tylenol or Motrin.  2. Following the banding, avoid strenuous exercise that evening and resume full activity the next day.  A sitz bath (soaking in a warm tub) or bidet is soothing, and can be useful for cleansing the area after bowel movements.     3. To avoid constipation, take two tablespoons of natural wheat bran, natural oat bran, flax, Benefiber or any over the counter fiber supplement and increase your water intake to 7-8 glasses daily.    4. Unless you have been prescribed anorectal medication, do not put anything inside your rectum for two weeks: No suppositories, enemas, fingers, etc.  5. Occasionally, you may have more bleeding than usual after the banding procedure.  This is often from the untreated hemorrhoids rather than the treated one.  Don't be concerned if there is a tablespoon or so of blood.  If there is more blood than this, lie flat with your bottom higher than your head and apply an ice pack to the area. If the bleeding does not stop within a half an hour or if you feel faint, call our office at [phone] or go to the emergency room.  6. Problems are not common; however, if there is a substantial amount of bleeding, severe pain, chills, fever or difficulty passing urine (very rare) or other  problems, you should call us at [phone] or report to the nearest emergency room.  7. Do not stay seated continuously for more than 2-3 hours for a day or two after the procedure.  Tighten your buttock muscles 10-15 times every two hours and take 10-15 deep breaths every 1-2 hours.  Do not spend more than a few minutes on the toilet if you cannot empty your bowel; instead re-visit the toilet at a later time.

## 2016-05-14 NOTE — Progress Notes (Signed)
Patient ID: Hunter Boone, male   DOB: [birth year], 70 y.o.   MRN: [id number]  Hunter Boone is a 70 year old male known to Dr. Fuller Plan who presents for consideration of hemorrhoidal banding for symptomatic internal hemorrhoids. He had a colonoscopy performed recently on 04/20/2016. He had several subcentimeter polyps removed of mixed type. One was a tubular adenoma, 1 hyperplastic and the other benign. He had moderate diverticulosis and medium internal hemorrhoids. Hemorrhoids been symptomatic for him through the years. Primarily this is intermittent bleeding, perianal irritation and fecal seepage. No prior hemorrhoidal treatment   PROCEDURE NOTE:  The patient presents with symptomatic grade 2 internal hemorrhoids, requesting rubber band ligation of his hemorrhoidal disease.  All risks, benefits and alternative forms of therapy were described and informed consent was obtained.   The anorectum was pre-medicated with 0.125% nitroglycerin ointment The decision was made to band the LL  internal hemorrhoid, and the [place] was used to perform band ligation without complication.   Digital anorectal examination was then performed to assure proper positioning of the band, and to adjust the banded tissue as required.  The patient was discharged home without pain or other issues.  Dietary and behavioral recommendations were given and along with follow-up instructions.     The patient will return as scheduled for follow-up and possible additional banding as required. No complications were encountered and the patient tolerated the procedure well.

## 2016-05-20 DIAGNOSIS — R0602 Shortness of breath: Secondary | ICD-10-CM | POA: Diagnosis not present

## 2016-05-21 ENCOUNTER — Telehealth: Payer: Self-pay | Admitting: Internal Medicine

## 2016-05-21 ENCOUNTER — Inpatient Hospital Stay: Admission: RE | Admit: 2016-05-21 | Payer: Medicare Other | Source: Ambulatory Visit

## 2016-05-21 ENCOUNTER — Ambulatory Visit (INDEPENDENT_AMBULATORY_CARE_PROVIDER_SITE_OTHER): Payer: Medicare Other | Admitting: Family Medicine

## 2016-05-21 ENCOUNTER — Emergency Department (HOSPITAL_COMMUNITY)
Admission: EM | Admit: 2016-05-21 | Discharge: 2016-05-21 | Discharge status: Absent without leave | Payer: Medicare Other

## 2016-05-21 ENCOUNTER — Encounter: Payer: Self-pay | Admitting: Family Medicine

## 2016-05-21 VITALS — BP 152/90 | HR 76 | Temp 98.2°F | Resp 12 | Wt 135.8 lb

## 2016-05-21 DIAGNOSIS — R0781 Pleurodynia: Secondary | ICD-10-CM

## 2016-05-21 DIAGNOSIS — W19XXXA Unspecified fall, initial encounter: Secondary | ICD-10-CM

## 2016-05-21 DIAGNOSIS — I10 Essential (primary) hypertension: Secondary | ICD-10-CM | POA: Diagnosis not present

## 2016-05-21 NOTE — Telephone Encounter (Signed)
PLEASE NOTE: All timestamps contained within this report are represented as [place] Standard Time. CONFIDENTIALTY NOTICE: This fax transmission is intended only for the addressee. It contains information that is legally privileged, confidential or otherwise protected from use or disclosure. If you are not the intended recipient, you are strictly prohibited from reviewing, disclosing, copying using or disseminating any of this information or taking any action in reliance on or regarding this information. If you have received this fax in error, please notify us immediately by telephone so that we can arrange for its return to [place]. Phone: [phone], Toll-Free: [phone], Fax: [phone] Page: 1 of 1 Call Id: [id number] Laverne Day - Client [facility] Patient Name: Hunter Boone DOB: 16-Jun-1945 Initial Comment Caller States- I fell on the floor and hurt my back two days ago, my left side is in severe in pain Nurse Assessment Nurse: Dimas Chyle, RN, Dellis Filbert Date/Time Eilene Ghazi Time): 05/21/2016 9:32:30 AM Confirm and document reason for call. If symptomatic, describe symptoms. ---Caller states- I fell on the floor and hurt my back two days ago, my left side is in severe in pain. Having productive cough since fall. No fever. Does the patient have any new or worsening symptoms? ---Yes Will a triage be completed? ---Yes Related visit to physician within the last 2 weeks? ---No Does the PT have any chronic conditions? (i.e. diabetes, asthma, etc.) ---Yes List chronic conditions. ---HTN Is this a behavioral health or substance abuse call? ---No Guidelines Guideline Title Affirmed Question Affirmed Notes Back Injury [1] High-risk adult (e.g., age > 46, osteoporosis, chronic steroid use) AND [2] still hurts Final Disposition User See Physician within 24 Hours Dimas Chyle, RN, [employer] Referrals REFERRED TO PCP OFFICE Disagree/Comply:  Leta Baptist

## 2016-05-21 NOTE — ED Notes (Signed)
Pt arrived thinking that he was here to get an Outpatient CT.  Per chart review, Pt missed his CT appointment w/ [facility] and was told to come to ED.  Pt reports that he is going to call the on-call MD for his PCP for advice and let Registration know, if he wants to be seen.

## 2016-05-21 NOTE — Progress Notes (Signed)
Pre visit review using our clinic review tool, if applicable. No additional management support is needed unless otherwise documented below in the visit note. 

## 2016-05-21 NOTE — Progress Notes (Signed)
HPI:  ACUTE VISIT:  Chief Complaint  Patient presents with  . Fall    Pain on the left side    Hunter.Hunter Boone is a 70 y.o. male, who is here today complaining of left rib cage pain after a fall on 05/19/16. According to pt, he was walking to his living room , it was dark and slid on cellophane paper that was on floor. He landed on left side, denies head trauma or LOC. He started with pain right after. Pain is sharp, 6/10, constant and more severe with certain movements.Relieved with rest.  He denies chest pain, dyspnea, wheezing, diaphoresis, or dizziness.  + Smoker. Hx of emphysema, per CXR 02/2014.  He has not taken medication for pain.  Hunter Boone is stable, not improving. BP elevated today. Hx of HTN, he is on Cozaar 100 mg daily.   Review of Systems  Constitutional: Negative for chills, fatigue and fever.  HENT: Negative for sore throat, trouble swallowing and voice change.   Respiratory: Negative for cough, shortness of breath, wheezing and stridor.   Cardiovascular: Negative for chest pain, palpitations and leg swelling.  Gastrointestinal: Negative for abdominal pain, nausea and vomiting.  Musculoskeletal: Positive for back pain. Negative for gait problem.  Skin: Negative for color change and wound.  Neurological: Negative for syncope and headaches.      Current Outpatient Prescriptions on File Prior to Visit  Medication Sig Dispense Refill  . aspirin 81 MG tablet Take 81 mg by mouth daily.      . hydrocortisone (ANUSOL-HC) 2.5 % rectal cream Place 1 application rectally 2 (two) times daily. Use 7-14 days 30 g 0  . hydrocortisone 2.5 % cream Apply 1 application topically 3 (three) times daily as needed.     [name] losartan (COZAAR) 100 MG tablet Take 1 tablet (100 mg total) by mouth daily. 90 tablet 3  . LUTEIN PO Take by mouth daily.      . Multiple Vitamins-Minerals (CENTRUM PO) Take by mouth daily.      . Multiple Vitamins-Minerals (PRESERVISION AREDS 2 PO) Take  by mouth. 4 by mouth daily     . Omega-3 Fatty Acids (FISH OIL) 1200 MG CAPS Take by mouth daily.      . Saw Palmetto 450 MG CAPS Take by mouth. 2am, 2 pm      Current Facility-Administered Medications on File Prior to Visit  Medication Dose Route Frequency Provider Last Rate Last Dose  . 0.9 %  sodium chloride infusion  500 mL Intravenous Continuous Ladene Artist, MD         Past Medical History:  Diagnosis Date  . Hypertension   . Internal hemorrhoids   . Macular degeneration   . Tubular adenoma of colon    No Known Allergies  Social History   Social History  . Marital status: Married    Spouse name: N/A  . Number of children: N/A  . Years of education: N/A   Occupational History  . retired    Social History Main Topics  . Smoking status: Current Some Day Smoker    Packs/day: 0.50    Types: Cigarettes, Pipe  . Smokeless tobacco: Never Used     Comment: Presently 2 bowls / day; smoked cigarettes age [age] , up to 1 ppd  . Alcohol use 4.2 oz/week    7 Shots of liquor per week  . Drug use: No  . Sexual activity: Not Asked   Other Topics Concern  .  None   Social History Narrative  . None    Vitals:   05/21/16 1449  BP: (!) 152/90  Pulse: 76  Resp: 12  Temp: 98.2 F (36.8 C)   O2 sat at RA 91-92%%  Body mass index is 20.05 kg/m.    Physical Exam  Nursing note and vitals reviewed. Constitutional: He is oriented to person, place, and time. He appears well-developed and well-nourished. He does not appear ill. He appears distressed (mild due to pain).  HENT:  Head: Atraumatic.  Eyes: Conjunctivae are normal.  Neck: No JVD present. No tracheal deviation present.  Cardiovascular: Normal rate and regular rhythm.   Distant heart sounds  Respiratory: Effort normal. No respiratory distress. He has decreased breath sounds (mild, left base) in the left lower field. He has no wheezes. He has no rhonchi. He has no rales.  Upon auscultation I hear intermittent  clicking/snapping like sound on left base.  GI: Soft. There is no tenderness.  Musculoskeletal:  Tenderness upon palpation of thoracic paraspinal muscles and posterior rib cage, no deformities appreciated. No tenderness upon palpation of spinel bone structures. There is crepitus sensation of soft tissue upon palpation on paraspinal muscles (T5-7 and on left lateral aspect of neck.  Costal pain elicited with deep breathing and with movement on exam table.  Lymphadenopathy:    He has no cervical adenopathy.  Neurological: He is alert and oriented to person, place, and time. He has normal strength. Coordination and gait normal.  Skin: Skin is warm. No ecchymosis and no rash noted. No erythema.  Psychiatric: He has a normal mood and affect.  Well groomed,good eye contact.      ASSESSMENT AND PLAN:     Hunter Boone was seen today for fall.  Diagnoses and all orders for this visit:  Costal margin pain  I discussed finding on examination today, hypoventilation, crepitus sensation of soft tissue, and clicking sound I heard a couple times on auscultation is highly suspicious for rib fracture with lung trauma and possible pneumothorax. He is hemodynamically stable, so I do not think he needs to go to ER now but I placed a stat chest CT, which was arranged for today. He was given instructions and he feels comfortable driving. I spoke with Hilda Blades at [facility] center and gave her my cell phone number. Pt is not to leave until report is available.  -     CT Chest Wo Contrast; Future  Essential hypertension  BP elevated today. No changes for now in Cozaar. Monitor BP at home.  Fall, accidental, initial encounter  Fall precautions discussed. For now I do not thin PT is needed.    4:45 pm I received phone call from [facility], Hunter Boone has not show, he was contacted and still on the road, 30 min from building. Center is about to close due to weather, so instructed to direct pt to the  closer ER he must have CT done today.    Return if symptoms worsen or fail to improve, for Depending on CT results.      Hunter Farver G. Martinique, MD  [facility] [place] office.

## 2016-05-21 NOTE — Telephone Encounter (Signed)
Pt scheduled with Dr Martinique today.

## 2016-05-24 ENCOUNTER — Observation Stay (HOSPITAL_COMMUNITY)
Admission: EM | Admit: 2016-05-24 | Discharge: 2016-05-25 | Disposition: A | Discharge status: Home / Self Care | Payer: Medicare Other | Attending: General Surgery | Admitting: General Surgery

## 2016-05-24 ENCOUNTER — Ambulatory Visit (INDEPENDENT_AMBULATORY_CARE_PROVIDER_SITE_OTHER)
Admission: RE | Admit: 2016-05-24 | Discharge: 2016-05-24 | Disposition: A | Discharge status: Home / Self Care | Payer: Medicare Other | Source: Ambulatory Visit | Attending: Family Medicine | Admitting: Family Medicine

## 2016-05-24 ENCOUNTER — Observation Stay (HOSPITAL_COMMUNITY): Payer: Medicare Other

## 2016-05-24 ENCOUNTER — Encounter (HOSPITAL_COMMUNITY): Payer: Self-pay | Admitting: Pharmacy Technician

## 2016-05-24 DIAGNOSIS — Z7982 Long term (current) use of aspirin: Secondary | ICD-10-CM | POA: Insufficient documentation

## 2016-05-24 DIAGNOSIS — S270XXA Traumatic pneumothorax, initial encounter: Secondary | ICD-10-CM | POA: Diagnosis present

## 2016-05-24 DIAGNOSIS — Z79899 Other long term (current) drug therapy: Secondary | ICD-10-CM | POA: Diagnosis not present

## 2016-05-24 DIAGNOSIS — R0781 Pleurodynia: Secondary | ICD-10-CM | POA: Diagnosis not present

## 2016-05-24 DIAGNOSIS — R911 Solitary pulmonary nodule: Secondary | ICD-10-CM | POA: Diagnosis present

## 2016-05-24 DIAGNOSIS — W19XXXA Unspecified fall, initial encounter: Secondary | ICD-10-CM | POA: Diagnosis present

## 2016-05-24 DIAGNOSIS — J449 Chronic obstructive pulmonary disease, unspecified: Secondary | ICD-10-CM | POA: Diagnosis not present

## 2016-05-24 DIAGNOSIS — J939 Pneumothorax, unspecified: Secondary | ICD-10-CM | POA: Diagnosis not present

## 2016-05-24 DIAGNOSIS — I10 Essential (primary) hypertension: Secondary | ICD-10-CM | POA: Insufficient documentation

## 2016-05-24 DIAGNOSIS — S2242XA Multiple fractures of ribs, left side, initial encounter for closed fracture: Principal | ICD-10-CM | POA: Diagnosis present

## 2016-05-24 DIAGNOSIS — F17210 Nicotine dependence, cigarettes, uncomplicated: Secondary | ICD-10-CM | POA: Insufficient documentation

## 2016-05-24 DIAGNOSIS — S2249XA Multiple fractures of ribs, unspecified side, initial encounter for closed fracture: Secondary | ICD-10-CM

## 2016-05-24 LAB — CBC WITH DIFFERENTIAL/PLATELET
Basophils Absolute: 0 10*3/uL (ref 0.0–0.1)
Basophils Relative: 0 %
EOS ABS: 0.3 10*3/uL (ref 0.0–0.7)
Eosinophils Relative: 2 %
HEMATOCRIT: 41.8 % (ref 39.0–52.0)
HEMOGLOBIN: 14.5 g/dL (ref 13.0–17.0)
LYMPHS ABS: 2.5 10*3/uL (ref 0.7–4.0)
LYMPHS PCT: 21 %
MCH: 32.2 pg (ref 26.0–34.0)
MCHC: 34.7 g/dL (ref 30.0–36.0)
MCV: 92.7 fL (ref 78.0–100.0)
MONOS PCT: 9 %
Monocytes Absolute: 1 10*3/uL (ref 0.1–1.0)
NEUTROS ABS: 7.9 10*3/uL — AB (ref 1.7–7.7)
NEUTROS PCT: 68 %
Platelets: 277 10*3/uL (ref 150–400)
RBC: 4.51 MIL/uL (ref 4.22–5.81)
RDW: 13.9 % (ref 11.5–15.5)
WBC: 11.7 10*3/uL — AB (ref 4.0–10.5)

## 2016-05-24 LAB — COMPREHENSIVE METABOLIC PANEL
ALK PHOS: 48 U/L (ref 38–126)
ALT: 28 U/L (ref 17–63)
AST: 42 U/L — ABNORMAL HIGH (ref 15–41)
Albumin: 4.2 g/dL (ref 3.5–5.0)
Anion gap: 11 (ref 5–15)
BILIRUBIN TOTAL: 1.2 mg/dL (ref 0.3–1.2)
BUN: 15 mg/dL (ref 6–20)
CALCIUM: 9.6 mg/dL (ref 8.9–10.3)
CO2: 25 mmol/L (ref 22–32)
CREATININE: 0.81 mg/dL (ref 0.61–1.24)
Chloride: 99 mmol/L — ABNORMAL LOW (ref 101–111)
GFR calc non Af Amer: >60 mL/min (ref >60)
Glucose, Bld: 98 mg/dL (ref 65–99)
Potassium: 3.8 mmol/L (ref 3.5–5.1)
SODIUM: 135 mmol/L (ref 135–145)
TOTAL PROTEIN: 7.1 g/dL (ref 6.5–8.1)

## 2016-05-24 LAB — PROTIME-INR
INR: 1.01
Prothrombin Time: 13.3 seconds (ref 11.4–15.2)

## 2016-05-24 MED ORDER — ACETAMINOPHEN 325 MG PO TABS
650.0000 mg | ORAL_TABLET | Freq: Four times a day (QID) | ORAL | Status: DC
  Administered 2016-05-24 – 2016-05-25 (×3): 650 mg via ORAL
  Filled 2016-05-24 (×3): qty 2

## 2016-05-24 MED ORDER — IBUPROFEN 200 MG PO TABS: 800.0000 mg | ORAL_TABLET | Freq: Three times a day (TID) | ORAL | Status: DC | PRN

## 2016-05-24 MED ORDER — OXYCODONE HCL 5 MG PO TABS
5.0000 mg | ORAL_TABLET | Freq: Four times a day (QID) | ORAL | Status: DC | PRN
  Administered 2016-05-25: 5 mg via ORAL
  Filled 2016-05-24: qty 1

## 2016-05-24 MED ORDER — SODIUM CHLORIDE 0.9 % IV SOLN
INTRAVENOUS | Status: DC
  Administered 2016-05-24 – 2016-05-25 (×2): via INTRAVENOUS

## 2016-05-24 MED ORDER — GABAPENTIN 300 MG PO CAPS
300.0000 mg | ORAL_CAPSULE | Freq: Three times a day (TID) | ORAL | Status: DC
  Administered 2016-05-24 – 2016-05-25 (×2): 300 mg via ORAL
  Filled 2016-05-24 (×2): qty 1

## 2016-05-24 MED ORDER — HYDROMORPHONE HCL 1 MG/ML IJ SOLN: 0.2000 mg | INTRAMUSCULAR | Status: DC | PRN

## 2016-05-24 MED ORDER — ONDANSETRON HCL 4 MG PO TABS: 4.0000 mg | ORAL_TABLET | Freq: Four times a day (QID) | ORAL | Status: DC | PRN

## 2016-05-24 MED ORDER — HYDROMORPHONE HCL 2 MG/ML IJ SOLN: 0.2000 mg | INTRAMUSCULAR | Status: DC | PRN

## 2016-05-24 MED ORDER — LOSARTAN POTASSIUM 50 MG PO TABS
100.0000 mg | ORAL_TABLET | Freq: Every day | ORAL | Status: DC
  Administered 2016-05-25: 100 mg via ORAL
  Filled 2016-05-24: qty 2

## 2016-05-24 MED ORDER — LIDOCAINE HCL (PF) 1 % IJ SOLN
30.0000 mL | Freq: Once | INTRAMUSCULAR | Status: DC
  Filled 2016-05-24: qty 30

## 2016-05-24 MED ORDER — DOCUSATE SODIUM 100 MG PO CAPS
100.0000 mg | ORAL_CAPSULE | Freq: Two times a day (BID) | ORAL | Status: DC
  Administered 2016-05-25: 100 mg via ORAL
  Filled 2016-05-24 (×2): qty 1

## 2016-05-24 MED ORDER — ENOXAPARIN SODIUM 40 MG/0.4ML ~~LOC~~ SOLN
40.0000 mg | SUBCUTANEOUS | Status: DC
  Filled 2016-05-24: qty 0.4

## 2016-05-24 MED ORDER — ONDANSETRON HCL 4 MG/2ML IJ SOLN: 4.0000 mg | Freq: Four times a day (QID) | INTRAMUSCULAR | Status: DC | PRN

## 2016-05-24 MED ORDER — ONDANSETRON HCL 4 MG/2ML IJ SOLN
4.0000 mg | Freq: Once | INTRAMUSCULAR | Status: AC
  Administered 2016-05-24: 4 mg via INTRAVENOUS
  Filled 2016-05-24: qty 2

## 2016-05-24 MED ORDER — MORPHINE SULFATE (PF) 4 MG/ML IV SOLN
6.0000 mg | Freq: Once | INTRAVENOUS | Status: AC
  Administered 2016-05-24: 6 mg via INTRAVENOUS
  Filled 2016-05-24: qty 2

## 2016-05-24 NOTE — ED Notes (Signed)
Patient transported to X-ray 

## 2016-05-24 NOTE — Telephone Encounter (Signed)
Spoke with pt, sent to ER across the street.  Hunter Boone

## 2016-05-24 NOTE — ED Provider Notes (Signed)
[facility] DEPT Provider Note   CSN: [id number] Arrival date & time: 05/24/16  1541     History   Chief Complaint No chief complaint on file.   HPI Hunter Boone is a 70 y.o. male.  HPI Patient is a 70 year old male with past history of hypertension, smoking and COPD who presents with left sided chest pain after a fall 6 days ago and outpatient CT imaging showing multiple rib fractures and left pneumothorax. Patient reports he had a mechanical fall on 12/6 in his living room and landed on his left side. Patient denies hitting head, loss of consciousness or other injuries. He did note progressively worsening left lateral chest and left posterior chest pain and back pain following the fall. Patient was evaluated for these complaints that his primary care doctor 2 days after the fall was referred for outpatient CT scan but did not go at that time. Today he was referred again for CT scan. CT imaging of his chest revealed multiple left-sided rib fractures and pneumothorax. Patient was subsequently reviewed to the emergency department for evaluation.  Past Medical History:  Diagnosis Date  . Hypertension   . Internal hemorrhoids   . Macular degeneration   . Tubular adenoma of colon     Patient Active Problem List   Diagnosis Date Noted  . Pityriasis rosea 08/22/2015  . Nonspecific abnormal electrocardiogram (ECG) (EKG) 10/25/2012  . FASTING HYPERGLYCEMIA 08/19/2010  . 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 HEMORRHOIDS-INTERNAL 04/10/2009  . HEMORRHOIDS-EXTERNAL 04/10/2009  . DIVERTICULOSIS-COLON 04/10/2009  . PERSONAL HX COLONIC POLYPS 04/10/2009  . PIPE SMOKER 01/26/2008  . Essential hypertension 01/26/2008    Past Surgical History:  Procedure Laterality Date  . adenomatous polyps  2009, 2011,2014    Dr Fuller Plan;   . COLONOSCOPY    . POLYPECTOMY         Home Medications    Prior to Admission medications   Medication Sig Start Date End Date  Taking? Authorizing Provider  aspirin 81 MG tablet Take 81 mg by mouth daily.      Historical Provider, MD  hydrocortisone (ANUSOL-HC) 2.5 % rectal cream Place 1 application rectally 2 (two) times daily. Use 7-14 days 04/15/16   Levin Erp, PA  hydrocortisone 2.5 % cream Apply 1 application topically 3 (three) times daily as needed.     Historical Provider, MD  losartan (COZAAR) 100 MG tablet Take 1 tablet (100 mg total) by mouth daily. 06/13/15   Hendricks Limes, MD  LUTEIN PO Take by mouth daily.      Historical Provider, MD  Multiple Vitamins-Minerals (CENTRUM PO) Take by mouth daily.      Historical Provider, MD  Multiple Vitamins-Minerals (PRESERVISION AREDS 2 PO) Take by mouth. 4 by mouth daily     Historical Provider, MD  Omega-3 Fatty Acids (FISH OIL) 1200 MG CAPS Take by mouth daily.      Historical Provider, MD  Saw Palmetto 450 MG CAPS Take by mouth. 2am, 2 pm     Historical Provider, MD    Family History Family History  Problem Relation Age of Onset  . Heart attack Father     early 40s  . Diabetes Father   . Diabetes Mother   . Liver cancer Maternal Aunt   . Stroke Neg Hx   . COPD Neg Hx   . Colon cancer Neg Hx   . Esophageal cancer Neg Hx   . Rectal cancer Neg Hx   .  Stomach cancer Neg Hx   . Asthma Neg Hx     Social History Social History  Substance Use Topics  . Smoking status: Current Some Day Smoker    Packs/day: 0.50    Types: Cigarettes, Pipe  . Smokeless tobacco: Never Used     Comment: Presently 2 bowls / day; smoked cigarettes age [age] , up to 1 ppd  . Alcohol use 4.2 oz/week    7 Shots of liquor per week     Allergies   Patient has no known allergies.   Review of Systems Review of Systems  Constitutional: Negative for chills and fever.  HENT: Negative for ear pain and sore throat.   Eyes: Negative for pain and visual disturbance.  Respiratory: Positive for shortness of breath. Negative for cough.   Cardiovascular: Positive for  chest pain. Negative for palpitations.  Gastrointestinal: Negative for abdominal pain and vomiting.  Genitourinary: Negative for dysuria and hematuria.  Musculoskeletal: Positive for back pain. Negative for arthralgias.  Skin: Negative for color change and rash.  Neurological: Negative for seizures and syncope.  All other systems reviewed and are negative.    Physical Exam Updated Vital Signs BP 137/77 (BP Location: Right Arm)   Pulse (!) 50   Temp 97.3 F (36.3 C) (Oral)   Resp 11   Ht 5\' 9"  (1.753 m)   Wt 61.2 kg   SpO2 95%   BMI 19.94 kg/m   Physical Exam  Constitutional: He appears well-developed and well-nourished.  HENT:  Head: Normocephalic and atraumatic.  Eyes: Conjunctivae are normal.  Neck: Neck supple.  Cardiovascular: Normal rate and regular rhythm.   No murmur heard. Pulmonary/Chest: Effort normal. No respiratory distress. He has no wheezes. He exhibits tenderness.  Mild subcutaneous emphysema palpated over left lower neck. Breath sounds present but slightly diminished on left compared with right  Abdominal: Soft. There is no tenderness.  Musculoskeletal: He exhibits no edema.  Left lateral and posterior chest wall tenderness to palpation.  Neurological: He is alert.  Skin: Skin is warm and dry.  Psychiatric: He has a normal mood and affect.  Nursing note and vitals reviewed.    ED Treatments / Results  Labs (all labs ordered are listed, but only abnormal results are displayed) Labs Reviewed  CBC WITH DIFFERENTIAL/PLATELET - Abnormal; Notable for the following:       Result Value   WBC 11.7 (*)    Neutro Abs 7.9 (*)    All other components within normal limits  COMPREHENSIVE METABOLIC PANEL - Abnormal; Notable for the following:    Chloride 99 (*)    AST 42 (*)    All other components within normal limits  PROTIME-INR  CBC  BASIC METABOLIC PANEL    EKG  EKG Interpretation None       Radiology Ct Chest Wo Contrast  Addendum Date:  05/24/2016   ADDENDUM REPORT: 05/24/2016 15:27 ADDENDUM: Critical Value/emergent results were called by telephone at the time of interpretation on 05/24/2016 at 3:27 pm to Dr. BETTY Martinique , who verbally acknowledged these results. Electronically Signed   By: Lajean Manes M.D.   On: 05/24/2016 15:27   Result Date: 05/24/2016 CLINICAL DATA:  [address] 12/6th. Landed on lt side. Sudden lt post ib cage pain. Crepitus on exam. Eval for pneumothorax. EXAM: CT CHEST WITHOUT CONTRAST TECHNIQUE: Multidetector CT imaging of the chest was performed following the standard protocol without IV contrast. COMPARISON:  Chest radiograph, 03/12/2014 FINDINGS: Cardiovascular: Heart is normal in size and  configuration. There are coronary artery calcifications. Ascending aorta is prominent measuring 3.8 cm. Mild calcified atherosclerotic plaque noted along the aortic arch and descending thoracic aorta. Mediastinum/Nodes: No mediastinal or hilar masses. No pathologically enlarged lymph nodes. Lungs/Pleura: Patient has a moderate-sized left hydropneumothorax. No right pleural effusion or pneumothorax. There are moderate changes of emphysema. An area of focal opacity is noted in the right upper lobe measuring 9 mm in size consistent with a ground-glass nodule or, more likely, focus of scarring. No evidence of pneumonia or pulmonary edema. There is some atelectasis in the partly collapsed left lower lobe.LLungs are clear. No pleural effusion or pneumothorax. Upper Abdomen: No acute abnormality. Musculoskeletal: There are multiple posterior left rib fractures. Fractures extend from the posterior fifth rib two knee posterior ninth rib. Fractures of the sixth through eighth ribs are comminuted and minimally displaced. There is surrounding subcutaneous and deeper soft tissue air that also extends into the epidural spinal canal. There extends along the left anterolateral chest wall to the left neck base. There is also trace of pneumomediastinum.  IMPRESSION: 1. Multiple left-sided posterior rib fractures with an associated moderate-sized hydropneumothorax and significant subcutaneous and deeper soft tissue emphysema, a small amount of air within the epidural spinal canal and a trace amount of pneumomediastinum. 2. No other acute findings. 3. Moderate emphysema. 4. 9 mm irregular sub solid nodule in the right upper lobe centered on image 36, series 3. Initial follow-up with CT at 6-12 months is recommended to confirm persistence. If persistent, repeat CT is recommended every 2 years until 5 years of stability has been established. This recommendation follows the consensus statement: Guidelines for Management of Incidental Pulmonary Nodules Detected on CT Images: From the Fleischner Society 2017; Radiology 2017; 284:228-243. Electronically Signed: By: Lajean Manes M.D. On: 05/24/2016 15:05    Procedures Procedures (including critical care time)  Medications Ordered in ED Medications - No data to display   Initial Impression / Assessment and Plan / ED Course  I have reviewed the triage vital signs and the nursing notes.  Pertinent labs & imaging results that were available during my care of the patient were reviewed by me and considered in my medical decision making (see chart for details).  Clinical Course    Patient is a 70 year old male who presents from outpatient CT after imaging showed multiple left-sided rib fractures and left pneumothorax. No hypoxia or tachycardia on arrival. Patient is maintaining O2 sats greater than 95% on room air. No respiratory distress. Exam reveals tenderness to palpation over left lateral posterior ribs and subcutaneous emphysema as above. CT imaging reviewed shows left 5-9 rib fractures with associated moderate left pneumothorax. IV pain medicine given. Trauma surgery (Dr. Kae Heller) consulted. Due to subacute nature of patient's presentation, patient's overall hemodynamic stability, will defer chest tube for  now, will admit for observation and repeat imaging to determine evolution of pneumothorax.  Patient admitted to trauma surgery without further acute imaging during his care.   Patient seen and discussed with Dr. Kathrynn Humble, ED attending  Final Clinical Impressions(s) / ED Diagnoses   Final diagnoses:  Traumatic pneumothorax without open wound into thorax, initial encounter  Closed fracture of multiple ribs, unspecified laterality, initial encounter    New Prescriptions New Prescriptions   No medications on file     Gibson Ramp, MD 05/25/16 [place], MD 05/26/16 1541

## 2016-05-24 NOTE — ED Triage Notes (Signed)
Pt presents to the ED with his wife with reports of possible broken ribs. States his PCP sent him for Xrays and they told him after his xrays were done that he needed to come here to be evaluated. Pt states he tripped and fell this last Wednesday and hit his L side. Denies hitting his head or LOC. Pt complains of pain when trying to use the left side or take a deep breath. Pt states he can hear "popping" when inhaling deeply.

## 2016-05-24 NOTE — H&P (Signed)
Surgical H&P  CC: rib fractures, pneumothorax  HPI: This is a 70yo male who is here with his wife following an outpatient CT scan which revealed multiple rib fractures and pneumothorax.  He fell at home on 12/6 and landed on his left side. No loss of consciousness or other injuries but had posterior left chest and back pain along with shortness of breath immediately following the fall. Two days later he was seen at his PCP office and was noted to be stable and in no respiratory distress, but sent for outpatient CT which he did not complete at that time. He then called today and again was referred for imaging.   He does state that while he felt short of breath just after the fall, this had resolved after the first day. He denies any shortness of breath, difficulty breathing, palpitations, chest pain, lightheadedness or dizziness at this time and endorses that his symptoms have steadily improved since the fall, although he still is experiencing pain along the rib fractures. Denies fever or productive cough.  He is a current pack-a-day smoker and has a history of COPD.  No Known Allergies  Past Medical History:  Diagnosis Date  . Hypertension   . Internal hemorrhoids   . Macular degeneration   . Tubular adenoma of colon     Past Surgical History:  Procedure Laterality Date  . adenomatous polyps  2009, 2011,2014    Dr Fuller Plan;   . COLONOSCOPY    . POLYPECTOMY      Family History  Problem Relation Age of Onset  . Heart attack Father     early 54s  . Diabetes Father   . Diabetes Mother   . Liver cancer Maternal Aunt   . Stroke Neg Hx   . COPD Neg Hx   . Colon cancer Neg Hx   . Esophageal cancer Neg Hx   . Rectal cancer Neg Hx   . Stomach cancer Neg Hx   . Asthma Neg Hx     Social History   Social History  . Marital status: Married    Spouse name: N/A  . Number of children: N/A  . Years of education: N/A   Occupational History  . retired    Social History Main Topics  .  Smoking status: Current Some Day Smoker    Packs/day: 0.50    Types: Cigarettes, Pipe  . Smokeless tobacco: Never Used     Comment: Presently 2 bowls / day; smoked cigarettes age [age] , up to 1 ppd  . Alcohol use 4.2 oz/week    7 Shots of liquor per week  . Drug use: No  . Sexual activity: Not Asked   Other Topics Concern  . None   Social History Narrative  . None    Current Facility-Administered Medications on File Prior to Encounter  Medication Dose Route Frequency Provider Last Rate Last Dose  . 0.9 %  sodium chloride infusion  500 mL Intravenous Continuous Ladene Artist, MD       Current Outpatient Prescriptions on File Prior to Encounter  Medication Sig Dispense Refill  . aspirin 81 MG tablet Take 81 mg by mouth daily.      . hydrocortisone (ANUSOL-HC) 2.5 % rectal cream Place 1 application rectally 2 (two) times daily. Use 7-14 days 30 g 0  . hydrocortisone 2.5 % cream Apply 1 application topically 3 (three) times daily as needed.     [name] losartan (COZAAR) 100 MG tablet Take 1 tablet (100  mg total) by mouth daily. 90 tablet 3  . LUTEIN PO Take by mouth daily.      . Multiple Vitamins-Minerals (CENTRUM PO) Take by mouth daily.      . Multiple Vitamins-Minerals (PRESERVISION AREDS 2 PO) Take by mouth. 4 by mouth daily     . Omega-3 Fatty Acids (FISH OIL) 1200 MG CAPS Take by mouth daily.      . Saw Palmetto 450 MG CAPS Take by mouth. 2am, 2 pm       Review of Systems: a complete, 10pt review of systems was completed with pertinent positives and negatives as documented in the HPI.   Physical Exam: Vitals:   05/24/16 1730 05/24/16 1800  BP: 186/100 135/79  Pulse: 71 74  Resp: 22 11  Temp:     Gen: A&Ox3, no distress  Head: normocephalic, atraumatic, EOMI, anicteric.  Neck: supple without mass or thyromegaly. Voice normal. No Cspine tenderness Chest: unlabored respirations. Slightly reduced breath sounds on the left. Saturating 97% on room air. Mild subcutaneous  emphysema along left lower neck. Cardiovascular: RRR with palpable distal pulses. No JVD.  Abdomen: soft, nontender, nondistended. No mass or organomegaly. Extremities: warm, without edema, no deformities  Neuro: grossly intact Psych: appropriate mood and affect, appropriate insight Skin: warm and dry   CBC Latest Ref Rng & Units 05/24/2016 03/12/2014 10/25/2012  WBC 4.0 - 10.5 K/uL 11.7(H) 10.9(H) 9.2  Hemoglobin 13.0 - 17.0 g/dL 14.5 14.6 14.1  Hematocrit 39.0 - 52.0 % 41.8 42.6 41.0  Platelets 150 - 400 K/uL 277 338.0 336.0    CMP Latest Ref Rng & Units 05/24/2016 03/12/2014 10/25/2012  Glucose 65 - 99 mg/dL 98 95 85  BUN 6 - 20 mg/dL 15 10 10   Creatinine 0.61 - 1.24 mg/dL 0.81 0.7 0.6  Sodium 135 - 145 mmol/L 135 134(L) 129(L)  Potassium 3.5 - 5.1 mmol/L 3.8 4.3 4.0  Chloride 101 - 111 mmol/L 99(L) 102 96  CO2 22 - 32 mmol/L 25 26 26   Calcium 8.9 - 10.3 mg/dL 9.6 9.4 9.1  Total Protein 6.5 - 8.1 g/dL 7.1 - 6.5  Total Bilirubin 0.3 - 1.2 mg/dL 1.2 - 1.2  Alkaline Phos 38 - 126 U/L 48 - 52  AST 15 - 41 U/L 42(H) - 26  ALT 17 - 63 U/L 28 - 22    No results found for: INR, PROTIME  Imaging: CLINICAL DATA:  [address] [address] 12/6th. Landed on lt side. Sudden lt post ib cage pain. Crepitus on exam. Eval for pneumothorax.  EXAM: CT CHEST WITHOUT CONTRAST  TECHNIQUE: Multidetector CT imaging of the chest was performed following the standard protocol without IV contrast.  COMPARISON:  Chest radiograph, 03/12/2014  FINDINGS: Cardiovascular: Heart is normal in size and configuration. There are coronary artery calcifications. Ascending aorta is prominent measuring 3.8 cm. Mild calcified atherosclerotic plaque noted along the aortic arch and descending thoracic aorta.  Mediastinum/Nodes: No mediastinal or hilar masses. No pathologically enlarged lymph nodes.  Lungs/Pleura: Patient has a moderate-sized left hydropneumothorax. No right pleural effusion or pneumothorax. There are  moderate changes of emphysema. An area of focal opacity is noted in the right upper lobe measuring 9 mm in size consistent with a ground-glass nodule or, more likely, focus of scarring. No evidence of pneumonia or pulmonary edema. There is some atelectasis in the partly collapsed left lower lobe.LLungs are clear. No pleural effusion or pneumothorax.  Upper Abdomen: No acute abnormality.  Musculoskeletal: There are multiple posterior left rib fractures. Fractures extend  from the posterior fifth rib two knee posterior ninth rib. Fractures of the sixth through eighth ribs are comminuted and minimally displaced. There is surrounding subcutaneous and deeper soft tissue air that also extends into the epidural spinal canal. There extends along the left anterolateral chest wall to the left neck base. There is also trace of pneumomediastinum.  IMPRESSION: 1. Multiple left-sided posterior rib fractures with an associated moderate-sized hydropneumothorax and significant subcutaneous and deeper soft tissue emphysema, a small amount of air within the epidural spinal canal and a trace amount of pneumomediastinum. 2. No other acute findings. 3. Moderate emphysema. 4. 9 mm irregular sub solid nodule in the right upper lobe centered on image 36, series 3. Initial follow-up with CT at 6-12 months is recommended to confirm persistence. If persistent, repeat CT is recommended every 2 years until 5 years of stability has been established. This recommendation follows the consensus statement: Guidelines for Management of Incidental Pulmonary Nodules Detected on CT Images: From the Fleischner Society 2017; Radiology 2017; 284:228-243.  Electronically Signed: By: Lajean Manes M.D. On: 05/24/2016 15:05  A/P: 70yo gentleman with moderate, asymptomatic pneumothorax and multiple rib fractures, now a 6 day old injury. Impossible to know where on the curve this ptx is- improving or worsening since fall-  but given that he is clinically stable/ no oxygen requirement and subjectively improving at this point, will observe with pulse ox/tele with plan to place thoracostomy tube if he worsens. We discussed that procedure should it need to occur. Multimodal pain control, pulmonary toilet. Two-view CXR in AM   Romana Juniper, MD [facility], [place] Pager [phone]

## 2016-05-24 NOTE — ED Notes (Signed)
Pt provided with ice chips, okay'd by Trauma surgery

## 2016-05-25 ENCOUNTER — Observation Stay (HOSPITAL_COMMUNITY): Payer: Medicare Other

## 2016-05-25 DIAGNOSIS — R911 Solitary pulmonary nodule: Secondary | ICD-10-CM | POA: Diagnosis present

## 2016-05-25 DIAGNOSIS — S2242XA Multiple fractures of ribs, left side, initial encounter for closed fracture: Secondary | ICD-10-CM | POA: Diagnosis present

## 2016-05-25 DIAGNOSIS — S240XXA Concussion and edema of thoracic spinal cord, initial encounter: Secondary | ICD-10-CM | POA: Diagnosis not present

## 2016-05-25 DIAGNOSIS — W19XXXA Unspecified fall, initial encounter: Secondary | ICD-10-CM | POA: Diagnosis present

## 2016-05-25 DIAGNOSIS — S270XXA Traumatic pneumothorax, initial encounter: Secondary | ICD-10-CM | POA: Diagnosis not present

## 2016-05-25 LAB — BASIC METABOLIC PANEL
ANION GAP: 8 (ref 5–15)
BUN: 10 mg/dL (ref 6–20)
CALCIUM: 8.6 mg/dL — AB (ref 8.9–10.3)
CO2: 25 mmol/L (ref 22–32)
CREATININE: 0.71 mg/dL (ref 0.61–1.24)
Chloride: 102 mmol/L (ref 101–111)
GFR calc Af Amer: >60 mL/min (ref >60)
GLUCOSE: 84 mg/dL (ref 65–99)
Potassium: 3.9 mmol/L (ref 3.5–5.1)
Sodium: 135 mmol/L (ref 135–145)

## 2016-05-25 LAB — CBC
HCT: 38.1 % — ABNORMAL LOW (ref 39.0–52.0)
Hemoglobin: 12.9 g/dL — ABNORMAL LOW (ref 13.0–17.0)
MCH: 32.1 pg (ref 26.0–34.0)
MCHC: 33.9 g/dL (ref 30.0–36.0)
MCV: 94.8 fL (ref 78.0–100.0)
PLATELETS: 230 10*3/uL (ref 150–400)
RBC: 4.02 MIL/uL — ABNORMAL LOW (ref 4.22–5.81)
RDW: 13.9 % (ref 11.5–15.5)
WBC: 9.4 10*3/uL (ref 4.0–10.5)

## 2016-05-25 LAB — MRSA PCR SCREENING: MRSA BY PCR: NEGATIVE

## 2016-05-25 MED ORDER — TRAMADOL HCL 50 MG PO TABS: 50.0000 mg | ORAL_TABLET | Freq: Four times a day (QID) | ORAL | 0 refills | Status: DC | PRN

## 2016-05-25 MED ORDER — ORAL CARE MOUTH RINSE
15.0000 mL | Freq: Two times a day (BID) | OROMUCOSAL | Status: DC
  Administered 2016-05-25: 15 mL via OROMUCOSAL

## 2016-05-25 NOTE — Progress Notes (Signed)
Discharge note. Reviewed all discharge paperwork with patient and pt's wife at the bedside including all medications (home and new). Family asked appropriate questions about follow up due to a primary care physician listed that is now deceased, however the current primary care provider was also on the list that they will be following up with. Patient was ready for discharge and was taken off the unit via wheelchair by the volunteer.

## 2016-05-25 NOTE — Progress Notes (Signed)
Patient ID: Hunter Boone, male   DOB: [birth year], 70 y.o.   MRN: [id number]   LOS: 0 days   Subjective: Denies SOB, minimal pain   Objective: Vital signs in last 24 hours: Temp:  [97.3 F (36.3 C)-98.3 F (36.8 C)] 97.7 F (36.5 C) (12/12 0400) Pulse Rate:  [50-74] 50 (12/12 0400) Resp:  [10-25] 10 (12/12 0400) BP: (135-186)/(72-100) 139/72 (12/12 0400) SpO2:  [95 %-98 %] 96 % (12/12 0400) Weight:  [61.2 kg (135 lb)] 61.2 kg (135 lb) (12/11 1619) Last BM Date: 05/23/16   Laboratory  CBC  Recent Labs  05/24/16 1705 05/25/16 0443  WBC 11.7* 9.4  HGB 14.5 12.9*  HCT 41.8 38.1*  PLT 277 230   BMET  Recent Labs  05/24/16 1705 05/25/16 0443  NA 135 135  K 3.8 3.9  CL 99* 102  CO2 25 25  GLUCOSE 98 84  BUN 15 10  CREATININE 0.81 0.71  CALCIUM 9.6 8.6*    Physical Exam General appearance: alert and no distress Resp: diminished breath sounds LUL Cardio: regular rate and rhythm GI: normal findings: bowel sounds normal and soft, non-tender Pulses: 2+ and symmetric   Assessment/Plan: Fall Multiple left rib fxs w/PTX -- CXR pending RUL nodule -- F/u w/PCP FEN -- Give diet VTE -- SCD's, Lovenox Dispo -- Home today if PTX is stable    Lisette Abu, PA-C Pager: [phone] General Trauma [facility] Pager: [phone]  05/25/2016

## 2016-05-25 NOTE — Progress Notes (Signed)
Received order to d/c patient.  PIV removed.  Provided pt with discharge paperwork.  Answered all questions.

## 2016-05-25 NOTE — Progress Notes (Signed)
Pt transferred from ED to 3s01 after report.  Pt arrives A&Ox4 and has been instructed on call bell use and surroundings.  Will continue to monitor.

## 2016-05-25 NOTE — Discharge Instructions (Signed)
° °  Rib Fracture A rib fracture is a break or crack in one of the bones of the ribs. The ribs are like a cage that goes around your upper chest. A broken or cracked rib is often painful, but most do not cause other problems. Most rib fractures heal on their own in 1-3 months. Follow these instructions at home:  Avoid activities that cause pain to the injured area. Protect your injured area.  Slowly increase activity as told by your doctor.  Take medicine as told by your doctor.  Put ice on the injured area for the first 1-2 days after you have been treated or as told by your doctor.  Put ice in a plastic bag.  Place a towel between your skin and the bag.  Leave the ice on for 15-20 minutes at a time, every 2 hours while you are awake.  Do deep breathing as told by your doctor. You may be told to:  Take deep breaths many times a day.  Cough many times a day while hugging a pillow.  Use a device (incentive spirometer) to perform deep breathing many times a day.  Drink enough fluids to keep your pee (urine) clear or pale yellow.  Do not wear a rib belt or binder. These do not allow you to breathe deeply. Get help right away if:  You have a fever.  You have trouble breathing.  You cannot stop coughing.  You cough up thick or bloody spit (mucus).  You feel sick to your stomach (nauseous), throw up (vomit), or have belly (abdominal) pain.  Your pain gets worse and medicine does not help. This information is not intended to replace advice given to you by your health care provider. Make sure you discuss any questions you have with your health care provider. Document Released: 03/09/2008 Document Revised: 11/06/2015 Document Reviewed: 08/02/2012 Elsevier Interactive Patient Education  2017 [place]. Increase activity as pain allows.

## 2016-05-25 NOTE — Discharge Summary (Signed)
Physician Discharge Summary  Patient ID: Hunter Boone MRN: [id number] DOB/AGE: [birth year] 70 y.o.  Admit date: 05/24/2016 Discharge date: 05/25/2016  Discharge Diagnoses Patient Active Problem List   Diagnosis Date Noted  . Fall 05/25/2016  . Fracture of multiple ribs of left side 05/25/2016  . Lung nodule 05/25/2016  . Traumatic pneumothorax 05/24/2016  . Pityriasis rosea 08/22/2015  . Nonspecific abnormal electrocardiogram (ECG) (EKG) 10/25/2012  . FASTING HYPERGLYCEMIA 08/19/2010  . 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 HEMORRHOIDS-INTERNAL 04/10/2009  . HEMORRHOIDS-EXTERNAL 04/10/2009  . DIVERTICULOSIS-COLON 04/10/2009  . PERSONAL HX COLONIC POLYPS 04/10/2009  . PIPE SMOKER 01/26/2008  . Essential hypertension 01/26/2008    Consultants None   Procedures None   HPI: Hunter Boone had an outpatient CT scan which revealed multiple rib fractures and a pneumothorax. He fell at home on 12/6 and landed on his left side. There was no loss of consciousness or other injuries but he had posterior left chest and back pain along with shortness of breath immediately following the fall. Two days later he was seen at his PCP's office and was noted to be stable and in no respiratory distress but was sent for an outpatient CT which he did not complete at that time. He then called again and was referred for imaging. He does state that while he felt short of breath just after the fall, this had resolved after the first day. He was a current pack-a-day smoker and has a history of COPD. The chest CT showed rib fractures and a moderate pneumothorax. He was told to come to the ED and was admitted by the trauma service for observation.   Hospital Course: The patient did well overnight with no shortness of breath and minimal pain. A repeat chest x-ray the following day was unchanged. He was discharged home in good condition with plans for follow up in a couple of weeks.      Medication List    TAKE these medications   aspirin 81 MG tablet Take 81 mg by mouth daily.   CENTRUM PO Take 1 tablet by mouth daily.   PRESERVISION AREDS 2 PO Take 1 capsule by mouth every morning.   Fish Oil 1200 MG Caps Take 1,200 mg by mouth daily.   hydrocortisone 2.5 % rectal cream Commonly known as:  ANUSOL-HC Place 1 application rectally 2 (two) times daily. Use 7-14 days   losartan 100 MG tablet Commonly known as:  COZAAR Take 1 tablet (100 mg total) by mouth daily.   LUTEIN PO Take 1 capsule by mouth daily.   Saw Palmetto 450 MG Caps Take 450 mg by mouth daily.   traMADol 50 MG tablet Commonly known as:  ULTRAM Take 1-2 tablets (50-100 mg total) by mouth every 6 (six) hours as needed.       Follow-up Information    [facility] Follow up on 06/09/2016.   Why:  2:00PM Get chest x-ray at [facility] Monday or Tuesday that week. You don't need an appointment for the x-ray, just go to the radiology department. Contact information: [place] [phone]           Signed: Lisette Abu, PA-C Pager: [phone] General Trauma PA Pager: [phone] 05/25/2016, 12:16 PM

## 2016-05-31 ENCOUNTER — Telehealth (HOSPITAL_COMMUNITY): Payer: Self-pay

## 2016-06-01 ENCOUNTER — Telehealth: Payer: Self-pay | Admitting: Internal Medicine

## 2016-06-01 MED ORDER — TRAMADOL HCL 50 MG PO TABS: 50.0000 mg | ORAL_TABLET | Freq: Four times a day (QID) | ORAL | 0 refills | Status: DC | PRN

## 2016-06-01 NOTE — Telephone Encounter (Signed)
RX faxed to [facility] 

## 2016-06-01 NOTE — Telephone Encounter (Signed)
Got rx refilled by PCP

## 2016-06-01 NOTE — Telephone Encounter (Signed)
Yes, I am willing to fill.  rx printed.

## 2016-06-01 NOTE — Telephone Encounter (Signed)
Patient is asking if you are willing to fill tramadol up until his visit on 12/29 with you. You have never seen him. He was a hopper patient. See discharge notes from recent hospital visit. Patient is down to 2 tramadol and can not get hospitalist to agree to fill. Are you willing to fill this?

## 2016-06-08 ENCOUNTER — Ambulatory Visit (HOSPITAL_COMMUNITY)
Admission: RE | Admit: 2016-06-08 | Discharge: 2016-06-08 | Disposition: A | Discharge status: Home / Self Care | Payer: Medicare Other | Source: Ambulatory Visit | Attending: Orthopedic Surgery | Admitting: Orthopedic Surgery

## 2016-06-08 DIAGNOSIS — X58XXXA Exposure to other specified factors, initial encounter: Secondary | ICD-10-CM | POA: Diagnosis not present

## 2016-06-08 DIAGNOSIS — S270XXA Traumatic pneumothorax, initial encounter: Secondary | ICD-10-CM | POA: Diagnosis not present

## 2016-06-08 DIAGNOSIS — J90 Pleural effusion, not elsewhere classified: Secondary | ICD-10-CM | POA: Insufficient documentation

## 2016-06-08 DIAGNOSIS — J439 Emphysema, unspecified: Secondary | ICD-10-CM | POA: Diagnosis not present

## 2016-06-11 ENCOUNTER — Ambulatory Visit (INDEPENDENT_AMBULATORY_CARE_PROVIDER_SITE_OTHER): Payer: Medicare Other | Admitting: Internal Medicine

## 2016-06-11 ENCOUNTER — Encounter: Payer: Self-pay | Admitting: Internal Medicine

## 2016-06-11 VITALS — BP 178/88 | HR 60 | Temp 97.5°F | Resp 16 | Wt 128.0 lb

## 2016-06-11 DIAGNOSIS — Z23 Encounter for immunization: Secondary | ICD-10-CM | POA: Diagnosis not present

## 2016-06-11 DIAGNOSIS — R911 Solitary pulmonary nodule: Secondary | ICD-10-CM | POA: Diagnosis not present

## 2016-06-11 DIAGNOSIS — S270XXD Traumatic pneumothorax, subsequent encounter: Secondary | ICD-10-CM | POA: Diagnosis not present

## 2016-06-11 DIAGNOSIS — J439 Emphysema, unspecified: Secondary | ICD-10-CM | POA: Diagnosis not present

## 2016-06-11 DIAGNOSIS — I10 Essential (primary) hypertension: Secondary | ICD-10-CM | POA: Diagnosis not present

## 2016-06-11 MED ORDER — TRAMADOL HCL 50 MG PO TABS: 50.0000 mg | ORAL_TABLET | Freq: Four times a day (QID) | ORAL | 0 refills | Status: DC | PRN

## 2016-06-11 MED ORDER — LOSARTAN POTASSIUM 100 MG PO TABS: 100.0000 mg | ORAL_TABLET | Freq: Every day | ORAL | 3 refills | Status: DC

## 2016-06-11 NOTE — Progress Notes (Addendum)
Subjective:    Patient ID: Hunter Boone, male    DOB: 1946-03-19, 70 y.o.   MRN: [id number]  HPI He is here to establish with a new pcp.     He was in the hospital from 12/11-12/12.  He fell at home and landed on his left side.  He sustained multiple rib fractures and a pneumothorax.  He had left chest pain, back pain and SOB immediately following with fall.  The SOB resolved after the first day.  He was seen as an outpatient and a CT scan was ordered, but he did not get it done.  He called later with SOB and went for imaging.  He had a moderate PTX along with the rib fractures.  He was observed overnight and discharged home.  His CXR was stable. He did not have SOB.  His pain is almost gone.  He gets some pain lateral chest - back.  He is taking tramadol two a day and it helps.    Hypertension: He is taking his medication daily. He is compliant with a low sodium diet.  He denies chest pain, palpitations, edema, shortness of breath and regular headaches. He is exercising regularly.  He does monitor his blood pressure at home - the lower number is a little high, but it is still in the normal range.  [name]    COPD:  He has occasional wheeze in the morning, but denies any cough or SOB on a regular basis.  He is still smoking pipes, but does not smoke cigarettes.  He does not use any inhalers.    RUL nodule, 9 mm:  This was seen on his recent CT scan.  He smokes 2-3 pipes / day.  He has a history of smoking cigarettes; 25-30 pack-years.    He exercises regularly.   Medications and allergies reviewed with patient and updated if appropriate.  Patient Active Problem List   Diagnosis Date Noted  . Fall 05/25/2016  . Fracture of multiple ribs of left side 05/25/2016  . Lung nodule 05/25/2016  . Traumatic pneumothorax 05/24/2016  . Pityriasis rosea 08/22/2015  . Nonspecific abnormal electrocardiogram (ECG) (EKG) 10/25/2012  . FASTING HYPERGLYCEMIA 08/19/2010  . HYPERPLASIA PROSTATE UNS W/O UR  OBST & OTH LUTS 07/11/2009  . HEMORRHOIDS-INTERNAL 04/10/2009  . HEMORRHOIDS-EXTERNAL 04/10/2009  . DIVERTICULOSIS-COLON 04/10/2009  . PERSONAL HX COLONIC POLYPS 04/10/2009  . PIPE SMOKER 01/26/2008  . Essential hypertension 01/26/2008    Current Outpatient Prescriptions on File Prior to Visit  Medication Sig Dispense Refill  . aspirin 81 MG tablet Take 81 mg by mouth daily.      . hydrocortisone (ANUSOL-HC) 2.5 % rectal cream Place 1 application rectally 2 (two) times daily. Use 7-14 days 30 g 0  . losartan (COZAAR) 100 MG tablet Take 1 tablet (100 mg total) by mouth daily. 90 tablet 3  . LUTEIN PO Take 1 capsule by mouth daily.     . Multiple Vitamins-Minerals (CENTRUM PO) Take 1 tablet by mouth daily.     . Multiple Vitamins-Minerals (PRESERVISION AREDS 2 PO) Take 1 capsule by mouth every morning.     . Omega-3 Fatty Acids (FISH OIL) 1200 MG CAPS Take 1,200 mg by mouth daily.     . Saw Palmetto 450 MG CAPS Take 450 mg by mouth daily.     . traMADol (ULTRAM) 50 MG tablet Take 1-2 tablets (50-100 mg total) by mouth every 6 (six) hours as needed. 40 tablet 0   Current  Facility-Administered Medications on File Prior to Visit  Medication Dose Route Frequency Provider Last Rate Last Dose  . 0.9 %  sodium chloride infusion  500 mL Intravenous Continuous Ladene Artist, MD        Past Medical History:  Diagnosis Date  . Hypertension   . Internal hemorrhoids   . Macular degeneration   . Tubular adenoma of colon     Past Surgical History:  Procedure Laterality Date  . adenomatous polyps  2009, 2011,2014    Dr Fuller Plan;   . COLONOSCOPY    . POLYPECTOMY      Social History   Social History  . Marital status: Married    Spouse name: N/A  . Number of children: N/A  . Years of education: N/A   Occupational History  . retired    Social History Main Topics  . Smoking status: Current Some Day Smoker    Packs/day: 0.50    Types: Cigarettes, Pipe  . Smokeless tobacco: Never Used      Comment: Presently 2 bowls / day; smoked cigarettes age [age] , up to 1 ppd  . Alcohol use 4.2 oz/week    7 Shots of liquor per week  . Drug use: No  . Sexual activity: Not Asked   Other Topics Concern  . None   Social History Narrative  . None    Family History  Problem Relation Age of Onset  . Heart attack Father     early 89s  . Diabetes Father   . Diabetes Mother   . Liver cancer Maternal Aunt   . Stroke Neg Hx   . COPD Neg Hx   . Colon cancer Neg Hx   . Esophageal cancer Neg Hx   . Rectal cancer Neg Hx   . Stomach cancer Neg Hx   . Asthma Neg Hx     Review of Systems  Constitutional: Negative for chills and fever.  Respiratory: Positive for wheezing (occ in morning). Negative for cough and shortness of breath.   Cardiovascular: Negative for chest pain, palpitations and leg swelling.  Gastrointestinal: Positive for diarrhea (occ). Negative for abdominal pain, blood in stool, constipation and nausea.  Genitourinary: Negative for difficulty urinating, dysuria and hematuria.  Neurological: Negative for dizziness, light-headedness and headaches.  Psychiatric/Behavioral: Negative for dysphoric mood. The patient is not nervous/anxious.        Objective:   Vitals:   06/11/16 1507  BP: (!) 178/88  Pulse: 60  Resp: 16  Temp: 97.5 F (36.4 C)   Filed Weights   06/11/16 1507  Weight: 128 lb (58.1 kg)   Body mass index is 18.9 kg/m.  Wt Readings from Last 3 Encounters:  06/11/16 128 lb (58.1 kg)  05/24/16 135 lb (61.2 kg)  05/21/16 135 lb 12.8 oz (61.6 kg)     Physical Exam Constitutional: Appears well-developed and well-nourished. No distress.  HENT:  Head: Normocephalic and atraumatic.  Neck: Neck supple. No tracheal deviation present. No thyromegaly present.  No cervical lymphadenopathy Cardiovascular: Normal rate, regular rhythm and normal heart sounds.   No murmur heard. No carotid bruit .  No edema Pulmonary/Chest: tenderness left  lateral-posterior chest wall,  Effort normal and breath sounds normal. No respiratory distress. No has no wheezes. No rales.  Skin: Skin is warm and dry. Not diaphoretic.  Psychiatric: Normal mood and affect. Behavior is normal.         Assessment & Plan:   See Problem List for Assessment and Plan of  chronic medical problems.

## 2016-06-11 NOTE — Progress Notes (Signed)
Pre visit review using our clinic review tool, if applicable. No additional management support is needed unless otherwise documented below in the visit note. 

## 2016-06-11 NOTE — Patient Instructions (Addendum)
BP at home should be less than 150/90 consistently, ideally less than 140/90.   All other Health Maintenance issues reviewed.   All recommended immunizations and age-appropriate screenings are up-to-date or discussed.  prevnar vaccine administered today.   Medications reviewed and updated.  No changes recommended at this time.  Your prescription(s) have been submitted to your pharmacy. Please take as directed and contact our office if you believe you are having problem(s) with the medication(s).   Please followup annually

## 2016-06-13 DIAGNOSIS — J439 Emphysema, unspecified: Secondary | ICD-10-CM | POA: Insufficient documentation

## 2016-06-13 NOTE — Assessment & Plan Note (Signed)
Much improved symptomatically - still has some pain in his lateral chest Tramadol as needed No SOB, cough, fever Will continue pain medication as long as needed Call if increase in SOB, chest pain

## 2016-06-13 NOTE — Assessment & Plan Note (Addendum)
BP high here, but he monitors it at home and it is well controlled Continue to monitor at home Continue losartan 100 mg daily Recent bmp normal

## 2016-06-13 NOTE — Assessment & Plan Note (Signed)
Seen on recent CT Has mild wheeze in morning only No cough, sob Stressed smoking cessation Discussed that if he becomes symptomatic we can consider an inhaler, but not needed at this time

## 2016-06-13 NOTE — Assessment & Plan Note (Signed)
Seen on recent CT  Follow up CT in 6 months - high risk given smoking history 25-30 cigarette pack year history Currently smoking 2-3 pipes per day Stressed smoking cessation

## 2016-07-15 ENCOUNTER — Ambulatory Visit (INDEPENDENT_AMBULATORY_CARE_PROVIDER_SITE_OTHER): Payer: Medicare Other | Admitting: Internal Medicine

## 2016-07-15 ENCOUNTER — Encounter: Payer: Self-pay | Admitting: Internal Medicine

## 2016-07-15 VITALS — BP 160/90 | HR 88 | Ht 69.0 in | Wt 127.0 lb

## 2016-07-15 DIAGNOSIS — K648 Other hemorrhoids: Secondary | ICD-10-CM | POA: Diagnosis not present

## 2016-07-15 NOTE — Patient Instructions (Addendum)
HEMORRHOID BANDING PROCEDURE    FOLLOW-UP CARE   1. The procedure you have had should have been relatively painless since the banding of the area involved does not have nerve endings and there is no pain sensation.  The rubber band cuts off the blood supply to the hemorrhoid and the band may fall off as soon as 48 hours after the banding (the band may occasionally be seen in the toilet bowl following a bowel movement). You may notice a temporary feeling of fullness in the rectum which should respond adequately to plain Tylenol or Motrin.  2. Following the banding, avoid strenuous exercise that evening and resume full activity the next day.  A sitz bath (soaking in a warm tub) or bidet is soothing, and can be useful for cleansing the area after bowel movements.     3. To avoid constipation, take two tablespoons of natural wheat bran, natural oat bran, flax, Benefiber or any over the counter fiber supplement and increase your water intake to 7-8 glasses daily.    4. Unless you have been prescribed anorectal medication, do not put anything inside your rectum for two weeks: No suppositories, enemas, fingers, etc.  5. Occasionally, you may have more bleeding than usual after the banding procedure.  This is often from the untreated hemorrhoids rather than the treated one.  Don't be concerned if there is a tablespoon or so of blood.  If there is more blood than this, lie flat with your bottom higher than your head and apply an ice pack to the area. If the bleeding does not stop within a half an hour or if you feel faint, call our office at [phone] or go to the emergency room.  6. Problems are not common; however, if there is a substantial amount of bleeding, severe pain, chills, fever or difficulty passing urine (very rare) or other problems, you should call us at [phone] or report to the nearest emergency room.  7. Do not stay seated continuously for more than 2-3 hours for a day or two  after the procedure.  Tighten your buttock muscles 10-15 times every two hours and take 10-15 deep breaths every 1-2 hours.  Do not spend more than a few minutes on the toilet if you cannot empty your bowel; instead re-visit the toilet at a later time.     Your 3rd hemorrhoidal banding is scheduled for 08-06-16 @ 3:30 pm.

## 2016-07-15 NOTE — Progress Notes (Signed)
Hunter Boone is a 71 year old male patient of Dr. Lynne Leader who returns for hemorrhoidal banding. Hemorrhoidal banding #1 initially performed on 05/14/2016 for symptomatic internal hemorrhoids Symptoms included intermittent bleeding, perianal irritation and fecal seepage He reports considerable improvement in hemorrhoidal symptoms with first banding and he wishes to proceed additional bandings   PROCEDURE NOTE:  The patient presents with symptomatic grade 2 internal hemorrhoids, requesting rubber band ligation of his hemorrhoidal disease.  All risks, benefits and alternative forms of therapy were described and informed consent was obtained.   The anorectum was pre-medicated with 0.125% nitroglycerin ointment The decision was made to band the RA (LL at visit #1) internal hemorrhoid, and the [place] was used to perform band ligation without complication.   Digital anorectal examination was then performed to assure proper positioning of the band, and to adjust the banded tissue as required.  The patient was discharged home without pain or other issues.  Dietary and behavioral recommendations were given and along with follow-up instructions.    The patient will return as scheduled for follow-up and possible additional banding as required. No complications were encountered and the patient tolerated the procedure well.

## 2016-08-06 ENCOUNTER — Ambulatory Visit (INDEPENDENT_AMBULATORY_CARE_PROVIDER_SITE_OTHER): Payer: Medicare Other | Admitting: Internal Medicine

## 2016-08-06 ENCOUNTER — Encounter: Payer: Self-pay | Admitting: Internal Medicine

## 2016-08-06 VITALS — BP 158/90 | HR 80 | Ht 69.0 in | Wt 130.1 lb

## 2016-08-06 DIAGNOSIS — K648 Other hemorrhoids: Secondary | ICD-10-CM

## 2016-08-06 DIAGNOSIS — R151 Fecal smearing: Secondary | ICD-10-CM

## 2016-08-06 NOTE — Patient Instructions (Signed)

## 2016-08-06 NOTE — Progress Notes (Signed)
Mr. Breitbach returns for additional hemorrhoidal banding 2nd band placed without issue or complication on [id number] Symptoms continued to be dramatically improved, specifically no bleeding, further seepage or perianal irritation Wishes to proceed with banding protocol   PROCEDURE NOTE:  The patient presents with symptomatic grade 2 internal hemorrhoids, requesting rubber band ligation of his hemorrhoidal disease.  All risks, benefits and alternative forms of therapy were described and informed consent was obtained.   The anorectum was pre-medicated with 0.125% nitroglycerin ointment The decision was made to band the RP (LL and RA banded previously) internal hemorrhoid, and the [place] was used to perform band ligation without complication.   Digital anorectal examination was then performed to assure proper positioning of the band, and to adjust the banded tissue as required.  The patient was discharged home without pain or other issues.  Dietary and behavioral recommendations were given and along with follow-up instructions.     The patient will return to Dr. Fuller Plan as needed. No complications were encountered and the patient tolerated the procedure well.

## 2016-09-06 ENCOUNTER — Telehealth: Payer: Self-pay | Admitting: Gastroenterology

## 2016-09-06 NOTE — Telephone Encounter (Signed)
Patient is a patient of Dr. Fuller Plan, but has  has had hemorrhoid banding x 2  With Dr. Hilarie Fredrickson. He reports has rectal bleeding and would like to schedule a 3rd banding.  He reports intermittent rectal bleeding that started a few days ago.  He reports that his stools are soft. "I think I have another hemorrhoid".  I scheduled him for a 3rd banding in May.  Is there any treatment until his appt in May?

## 2016-09-07 MED ORDER — HYDROCORTISONE 2.5 % RE CREA: 1.0000 "application " | TOPICAL_CREAM | Freq: Two times a day (BID) | RECTAL | 0 refills | Status: DC

## 2016-09-07 NOTE — Telephone Encounter (Signed)
Pt will try OTC Prep H supp and will call back if he does not have any relief.  Also, refill for hydrocortisone cream has been sent.

## 2016-09-07 NOTE — Telephone Encounter (Signed)
Left message on machine to call back  

## 2016-09-07 NOTE — Telephone Encounter (Signed)
Can give Anusol HC supp 25 1 qHS x 5 days OR OTC prep H supp per box instruction

## 2016-10-05 ENCOUNTER — Telehealth: Payer: Self-pay | Admitting: *Deleted

## 2016-10-05 NOTE — Telephone Encounter (Signed)
error 

## 2016-10-20 ENCOUNTER — Encounter: Payer: Self-pay | Admitting: Internal Medicine

## 2016-10-20 ENCOUNTER — Ambulatory Visit (INDEPENDENT_AMBULATORY_CARE_PROVIDER_SITE_OTHER): Payer: Medicare Other | Admitting: Internal Medicine

## 2016-10-20 DIAGNOSIS — K648 Other hemorrhoids: Secondary | ICD-10-CM | POA: Diagnosis not present

## 2016-10-20 NOTE — Patient Instructions (Addendum)
HEMORRHOID BANDING PROCEDURE    FOLLOW-UP CARE   1. The procedure you have had should have been relatively painless since the banding of the area involved does not have nerve endings and there is no pain sensation.  The rubber band cuts off the blood supply to the hemorrhoid and the band may fall off as soon as 48 hours after the banding (the band may occasionally be seen in the toilet bowl following a bowel movement). You may notice a temporary feeling of fullness in the rectum which should respond adequately to plain Tylenol or Motrin.  2. Following the banding, avoid strenuous exercise that evening and resume full activity the next day.  A sitz bath (soaking in a warm tub) or bidet is soothing, and can be useful for cleansing the area after bowel movements.     3. To avoid constipation, take two tablespoons of natural wheat bran, natural oat bran, flax, Benefiber or any over the counter fiber supplement and increase your water intake to 7-8 glasses daily.    4. Unless you have been prescribed anorectal medication, do not put anything inside your rectum for two weeks: No suppositories, enemas, fingers, etc.  5. Occasionally, you may have more bleeding than usual after the banding procedure.  This is often from the untreated hemorrhoids rather than the treated one.  Don't be concerned if there is a tablespoon or so of blood.  If there is more blood than this, lie flat with your bottom higher than your head and apply an ice pack to the area. If the bleeding does not stop within a half an hour or if you feel faint, call our office at [phone] or go to the emergency room.  6. Problems are not common; however, if there is a substantial amount of bleeding, severe pain, chills, fever or difficulty passing urine (very rare) or other problems, you should call us at [phone] or report to the nearest emergency room.  7. Do not stay seated continuously for more than 2-3 hours for a day or two  after the procedure.  Tighten your buttock muscles 10-15 times every two hours and take 10-15 deep breaths every 1-2 hours.  Do not spend more than a few minutes on the toilet if you cannot empty your bowel; instead re-visit the toilet at a later time.    Follow up with Dr Hilarie Fredrickson as needed.   I appreciate the opportunity to care for you.

## 2016-10-20 NOTE — Progress Notes (Signed)
Hunter Boone is a 71 year old male with a history of symptomatic internal hemorrhoids who completed 3 banding sessions on 08/06/2016 He was symptomatic with bleeding, fecal seepage and perianal irritation all of which have improved dramatically He has felt some mild prolapse and wonders if he has recurrent hemorrhoid. He also called the office on 09/06/2016 and reported some intermittent small volume rectal bleeding. He has been very happy after hemorrhoidal banding but wonders if an additional band needed for primarily prolapse. Very very rare if any rectal bleeding. Again symptoms dramatically improved than before banding. He has been very happy with the result  PROCEDURE NOTE: The patient presents with symptomatic grade 2 internal hemorrhoids, requesting rubber band ligation of his hemorrhoidal disease.  All risks, benefits and alternative forms of therapy were described and informed consent was obtained.  In the Left Lateral Decubitus position anoscopic examination revealed grade 1 hemorrhoids in the LL position(s).  no internal hemorrhoids seen in the right anterior or right posterior location The anorectum was pre-medicated with 0.125% nitroglycerin ointment Given small recurrent hemorrhoid in the left lateral position along with prolapse symptoms the decision was made to band the LL internal hemorrhoid, and the [place] was used to perform band ligation without complication.  Digital anorectal examination was then performed to assure proper positioning of the band, and to adjust the banded tissue as required.  The patient was discharged home without pain or other issues.  Dietary and behavioral recommendations were given and along with follow-up instructions.    The patient will return as needed for hemorrhoidal symptoms and otherwise with Dr. Fuller Plan for his GI management  No complications were encountered and the patient tolerated the procedure well.

## 2016-12-07 DIAGNOSIS — H53002 Unspecified amblyopia, left eye: Secondary | ICD-10-CM | POA: Diagnosis not present

## 2016-12-07 DIAGNOSIS — H25093 Other age-related incipient cataract, bilateral: Secondary | ICD-10-CM | POA: Diagnosis not present

## 2016-12-07 DIAGNOSIS — H353131 Nonexudative age-related macular degeneration, bilateral, early dry stage: Secondary | ICD-10-CM | POA: Diagnosis not present

## 2016-12-10 ENCOUNTER — Ambulatory Visit (INDEPENDENT_AMBULATORY_CARE_PROVIDER_SITE_OTHER)
Admission: RE | Admit: 2016-12-10 | Discharge: 2016-12-10 | Disposition: A | Discharge status: Home / Self Care | Payer: Medicare Other | Source: Ambulatory Visit | Attending: Internal Medicine | Admitting: Internal Medicine

## 2016-12-10 DIAGNOSIS — R911 Solitary pulmonary nodule: Secondary | ICD-10-CM | POA: Diagnosis not present

## 2017-02-01 DIAGNOSIS — R7303 Prediabetes: Secondary | ICD-10-CM | POA: Insufficient documentation

## 2017-02-01 NOTE — Assessment & Plan Note (Signed)
Check a1c, cmp Low sugar / carb diet Stressed regular exercise

## 2017-02-01 NOTE — Patient Instructions (Addendum)
  Mr. Prater , Thank you for taking time to come for your Medicare Wellness Visit. I appreciate your ongoing commitment to your health goals. Please review the following plan we discussed and let me know if I can assist you in the future.   These are the goals we discussed: Goals    None      This is a list of the screening recommended for you and due dates:  Health Maintenance  Topic Date Due  .  Hepatitis C: One time screening is recommended by Center for Disease Control  (CDC) for  adults born from [birth year] through 1965.   06-22-1945  . Flu Shot  01/12/2017  . Colon Cancer Screening  04/20/2021  . Tetanus Vaccine  06/05/2022  . Pneumonia vaccines  Completed    Test(s) ordered today. Your results will be released to [employer] (or called to you) after review, usually within 72hours after test completion. If any changes need to be made, you will be notified at that same time.  All other Health Maintenance issues reviewed.   All recommended immunizations and age-appropriate screenings are up-to-date or discussed.  No immunizations administered today.   Medications reviewed and updated.  No changes recommended at this time.  An EKG was done today  Please followup in one year

## 2017-02-01 NOTE — Progress Notes (Signed)
Subjective:    Patient ID: Hunter Boone, male    DOB: 1945-08-06, 71 y.o.   MRN: [id number]  HPI Here for medicare wellness exam and follow up of his chronic medical problems.   I have personally reviewed and have noted 1.The patient's medical and social history 2.Their use of alcohol, tobacco or illicit drugs 3.Their current medications and supplements 4.The patient's functional ability including ADL's, fall risks, home                 safety risk and hearing or visual impairment. 5.Diet and physical activities 6.Evidence for depression or mood disorders 7.Care team reviewed  - GI - Dr Hilarie Fredrickson  Hypertension: He is taking his medication daily. He is compliant with a low sodium diet.  He denies chest pain, palpitations, edema, shortness of breath and regular headaches. He is exercising regularly.  He does not monitor his blood pressure at home.    Prediabetes:  He is compliant with a low sugar/carbohydrate diet.  He is exercising regularly.   Are there smokers in your home (other than you)? No  Risk Factors Exercise: yard work Dietary issues discussed: well balanced, tries to limit ice cream, not much meat  Cardiac risk factors: advanced age, hypertension, hyperlipidemia, and obesity   Depression Screen  Have you felt down, depressed or hopeless? No  Have you felt little interest or pleasure in doing things?  No  Activities of Daily Living In your present state of health, do you have any difficulty performing the following activities?:  Driving? No Managing money?  No Feeding yourself? No Getting from bed to chair? No Climbing a flight of stairs? No Preparing food and eating?: No Bathing or showering? No Getting dressed: No Getting to/using the toilet? No Moving around from place to place: No In the past year have you fallen or had a near fall?: No        Do you have more than one sexual partner?  No    Hearing Difficulties: No Do you often ask people to speak up or repeat themselves? No Do you experience ringing or noises in your ears? No Do you have difficulty understanding soft or whispered voices? No Vision:              Any change in vision: no             Up to date with eye exam:   yes  Memory:  Do you feel that you have a problem with memory? No  Do you often misplace items? No  Do you feel safe at home?  Yes  Cognitive Testing  Alert, Orientated? Yes  Normal Appearance? Yes  Recall of three objects?  Yes  Can perform simple calculations? Yes  Displays appropriate judgment? Yes  Can read the correct time from a watch face? Yes   Advanced Directives have been discussed with the patient? Yes     Medications and allergies reviewed with patient and updated if appropriate.  Patient Active Problem List   Diagnosis Date Noted  . Prediabetes 02/01/2017  . COPD (chronic obstructive pulmonary disease) with emphysema ([place]) 06/13/2016  . Fall 05/25/2016  . Fracture of multiple ribs of left side 05/25/2016  . Lung nodule 05/25/2016  . Traumatic pneumothorax 05/24/2016  . Pityriasis rosea 08/22/2015  . Nonspecific abnormal electrocardiogram (ECG) (EKG) 10/25/2012  . 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 HEMORRHOIDS-INTERNAL 04/10/2009  . HEMORRHOIDS-EXTERNAL 04/10/2009  . DIVERTICULOSIS-COLON 04/10/2009  .  PERSONAL HX COLONIC POLYPS 04/10/2009  . PIPE SMOKER 01/26/2008  . Essential hypertension 01/26/2008    Current Outpatient Prescriptions on File Prior to Visit  Medication Sig Dispense Refill  . aspirin 81 MG tablet Take 81 mg by mouth daily.      . hydrocortisone (ANUSOL-HC) 2.5 % rectal cream Place 1 application rectally 2 (two) times daily. Use 7-14 days 30 g 0  . losartan (COZAAR) 100 MG tablet Take 1 tablet (100 mg total) by mouth daily. 90 tablet 3  . LUTEIN PO Take 1 capsule by mouth daily.     . Multiple Vitamins-Minerals (CENTRUM  PO) Take 1 tablet by mouth daily.     . Multiple Vitamins-Minerals (PRESERVISION AREDS 2 PO) Take 1 capsule by mouth every morning.     . Omega-3 Fatty Acids (FISH OIL) 1200 MG CAPS Take 1,200 mg by mouth daily.     . Saw Palmetto 450 MG CAPS Take 450 mg by mouth daily.      No current facility-administered medications on file prior to visit.     Past Medical History:  Diagnosis Date  . Hypertension   . Internal hemorrhoids   . Macular degeneration   . Tubular adenoma of colon     Past Surgical History:  Procedure Laterality Date  . adenomatous polyps  2009, 2011,2014    Dr Fuller Plan;   . COLONOSCOPY    . POLYPECTOMY      Social History   Social History  . Marital status: Married    Spouse name: N/A  . Number of children: N/A  . Years of education: N/A   Occupational History  . retired    Social History Main Topics  . Smoking status: Current Some Day Smoker    Packs/day: 0.50    Types: Cigarettes, Pipe  . Smokeless tobacco: Never Used     Comment: Presently 2 bowls / day; smoked cigarettes age [age] , up to 1 ppd  . Alcohol use 4.2 oz/week    7 Shots of liquor per week  . Drug use: No  . Sexual activity: Not Asked   Other Topics Concern  . None   Social History Narrative  . None    Family History  Problem Relation Age of Onset  . Heart attack Father        early 1s  . Diabetes Father   . Diabetes Mother   . Liver cancer Maternal Aunt   . Stroke Neg Hx   . COPD Neg Hx   . Colon cancer Neg Hx   . Esophageal cancer Neg Hx   . Rectal cancer Neg Hx   . Stomach cancer Neg Hx   . Asthma Neg Hx     Review of Systems  Constitutional: Negative for chills and fever.  Respiratory: Negative for cough, shortness of breath and wheezing.   Cardiovascular: Negative for chest pain, palpitations and leg swelling.  Gastrointestinal: Negative for abdominal pain, blood in stool, constipation, diarrhea and nausea.       No gerd  Neurological: Negative for  light-headedness and headaches.  Psychiatric/Behavioral: Positive for dysphoric mood (occ, related to [place]  memories ). Negative for sleep disturbance. The patient is not nervous/anxious.        Objective:   Vitals:   02/02/17 0812  BP: 140/82  Pulse: 71  Resp: 16  Temp: 98.1 F (36.7 C)  SpO2: 97%   Wt Readings from Last 3 Encounters:  02/02/17 128 lb (58.1 kg)  10/20/16 130  lb (59 kg)  08/06/16 130 lb 2 oz (59 kg)   Body mass index is 17.36 kg/m.   Physical Exam    Constitutional: Appears well-developed and well-nourished. No distress.  HENT:  Head: Normocephalic and atraumatic.  Neck: Neck supple. No tracheal deviation present. No thyromegaly present.  No cervical lymphadenopathy Cardiovascular: Normal rate, regular rhythm and normal heart sounds.   No murmur heard. No carotid bruit .  No edema Pulmonary/Chest: Effort normal and breath sounds normal. No respiratory distress. No has no wheezes. No rales.  Skin: Skin is warm and dry. Not diaphoretic.  Psychiatric: Normal mood and affect. Behavior is normal.      Assessment & Plan:   Wellness Exam: Immunizations  Up to date, discussed shingrix Colonoscopy Up to date  Eye exam   Up to date  Hearing loss  none Memory concerns/difficulties none above normal for age 32 of ADLs   fully Stressed the importance of regular exercise   Patient received copy of preventative screening tests/immunizations recommended for the next 5-10 years.   See Problem List for Assessment and Plan of chronic medical problems.    FU annually

## 2017-02-01 NOTE — Assessment & Plan Note (Addendum)
BP Readings from Last 3 Encounters:  02/02/17 140/82  10/20/16 128/72  08/06/16 (!) 158/90    BP controlled EKG today - Sinus rhythm at 62 bpm, old anterior infarct, no change compared to EKG from 2014 Current regimen effective and well tolerated Continue current medications at current doses Cmp, tsh, lipid

## 2017-02-02 ENCOUNTER — Encounter: Payer: Self-pay | Admitting: Internal Medicine

## 2017-02-02 ENCOUNTER — Other Ambulatory Visit (INDEPENDENT_AMBULATORY_CARE_PROVIDER_SITE_OTHER): Payer: Medicare Other

## 2017-02-02 ENCOUNTER — Ambulatory Visit (INDEPENDENT_AMBULATORY_CARE_PROVIDER_SITE_OTHER): Payer: Medicare Other | Admitting: Internal Medicine

## 2017-02-02 VITALS — BP 140/82 | HR 71 | Temp 98.1°F | Resp 16 | Ht 72.0 in | Wt 128.0 lb

## 2017-02-02 DIAGNOSIS — Z0000 Encounter for general adult medical examination without abnormal findings: Secondary | ICD-10-CM | POA: Diagnosis not present

## 2017-02-02 DIAGNOSIS — I10 Essential (primary) hypertension: Secondary | ICD-10-CM

## 2017-02-02 DIAGNOSIS — R7303 Prediabetes: Secondary | ICD-10-CM | POA: Diagnosis not present

## 2017-02-02 DIAGNOSIS — J439 Emphysema, unspecified: Secondary | ICD-10-CM

## 2017-02-02 LAB — TSH: TSH: 2.66 u[IU]/mL (ref 0.35–4.50)

## 2017-02-02 LAB — CBC WITH DIFFERENTIAL/PLATELET
BASOS PCT: 1.2 % (ref 0.0–3.0)
Basophils Absolute: 0.1 10*3/uL (ref 0.0–0.1)
EOS PCT: 3.7 % (ref 0.0–5.0)
Eosinophils Absolute: 0.3 10*3/uL (ref 0.0–0.7)
HEMATOCRIT: 44.9 % (ref 39.0–52.0)
HEMOGLOBIN: 15.3 g/dL (ref 13.0–17.0)
LYMPHS PCT: 27.9 % (ref 12.0–46.0)
Lymphs Abs: 2 10*3/uL (ref 0.7–4.0)
MCHC: 34 g/dL (ref 30.0–36.0)
MCV: 95.9 fl (ref 78.0–100.0)
MONOS PCT: 10.1 % (ref 3.0–12.0)
Monocytes Absolute: 0.7 10*3/uL (ref 0.1–1.0)
Neutro Abs: 4.2 10*3/uL (ref 1.4–7.7)
Neutrophils Relative %: 57.1 % (ref 43.0–77.0)
Platelets: 339 10*3/uL (ref 150.0–400.0)
RBC: 4.68 Mil/uL (ref 4.22–5.81)
RDW: 14 % (ref 11.5–15.5)
WBC: 7.3 10*3/uL (ref 4.0–10.5)

## 2017-02-02 LAB — LIPID PANEL
CHOLESTEROL: 174 mg/dL (ref 0–200)
HDL: 78.5 mg/dL (ref >39.00)
LDL Cholesterol: 78 mg/dL (ref 0–99)
NonHDL: 95.46
TRIGLYCERIDES: 88 mg/dL (ref 0.0–149.0)
Total CHOL/HDL Ratio: 2
VLDL: 17.6 mg/dL (ref 0.0–40.0)

## 2017-02-02 LAB — COMPREHENSIVE METABOLIC PANEL
ALBUMIN: 4.4 g/dL (ref 3.5–5.2)
ALT: 22 U/L (ref 0–53)
AST: 23 U/L (ref 0–37)
Alkaline Phosphatase: 55 U/L (ref 39–117)
BUN: 11 mg/dL (ref 6–23)
CALCIUM: 9.8 mg/dL (ref 8.4–10.5)
CO2: 28 mEq/L (ref 19–32)
CREATININE: 0.8 mg/dL (ref 0.40–1.50)
Chloride: 97 mEq/L (ref 96–112)
GFR: 101.13 mL/min (ref >60.00)
Glucose, Bld: 103 mg/dL — ABNORMAL HIGH (ref 70–99)
POTASSIUM: 4.5 meq/L (ref 3.5–5.1)
Sodium: 133 mEq/L — ABNORMAL LOW (ref 135–145)
Total Bilirubin: 1 mg/dL (ref 0.2–1.2)
Total Protein: 7.7 g/dL (ref 6.0–8.3)

## 2017-02-02 LAB — HEMOGLOBIN A1C: HEMOGLOBIN A1C: 5.7 % (ref 4.6–6.5)

## 2017-02-02 MED ORDER — ZOSTER VAC RECOMB ADJUVANTED 50 MCG/0.5ML IM SUSR: 0.5000 mL | Freq: Once | INTRAMUSCULAR | 1 refills | Status: AC

## 2017-02-02 NOTE — Assessment & Plan Note (Addendum)
Asymptomatic Treatment not needed He still smokes a pipe, but is working on quitting Continue regular exercise

## 2017-02-03 ENCOUNTER — Encounter: Payer: Self-pay | Admitting: Internal Medicine

## 2017-03-28 ENCOUNTER — Telehealth: Payer: Self-pay | Admitting: Internal Medicine

## 2017-03-28 DIAGNOSIS — R911 Solitary pulmonary nodule: Secondary | ICD-10-CM

## 2017-03-28 NOTE — Telephone Encounter (Signed)
He is due for a ct scan of his chest to f/u the lung nodule - please let him know this was ordered and someone will call him to schedule it.

## 2017-03-29 NOTE — Telephone Encounter (Signed)
Spoke with pt's wife to inform.  

## 2017-04-04 ENCOUNTER — Other Ambulatory Visit: Payer: Medicare Other

## 2017-04-04 ENCOUNTER — Ambulatory Visit (INDEPENDENT_AMBULATORY_CARE_PROVIDER_SITE_OTHER)
Admission: RE | Admit: 2017-04-04 | Discharge: 2017-04-04 | Disposition: A | Discharge status: Home / Self Care | Payer: Medicare Other | Source: Ambulatory Visit | Attending: Internal Medicine | Admitting: Internal Medicine

## 2017-04-04 DIAGNOSIS — R911 Solitary pulmonary nodule: Secondary | ICD-10-CM

## 2017-04-06 ENCOUNTER — Ambulatory Visit (INDEPENDENT_AMBULATORY_CARE_PROVIDER_SITE_OTHER): Payer: Medicare Other | Admitting: Internal Medicine

## 2017-04-06 ENCOUNTER — Encounter: Payer: Self-pay | Admitting: Internal Medicine

## 2017-04-06 VITALS — BP 140/90 | HR 68 | Ht 69.0 in | Wt 130.2 lb

## 2017-04-06 DIAGNOSIS — K648 Other hemorrhoids: Secondary | ICD-10-CM

## 2017-04-06 NOTE — Progress Notes (Signed)
   Subjective:    Patient ID: Hunter Boone, male    DOB: 07-07-1945, 71 y.o.   MRN: [id number]  HPI Hunter Boone is a 71 year old male with a past medical history of symptomatic internal hemorrhoids who is seen for follow-up. He is here alone today.  He was last seen on 10/20/2016 where he underwent an additional, fourth, hemorrhoidal band placement for symptomatic internal hemorrhoids. His symptoms were predominantly that of intermittent rectal bleeding, perianal irritation and prolapse symptoms. Banding was initially done on 05/20/2016, 07/15/2016 and 08/06/2016 203 hemorrhoidal columns. He had recurrence by May 2018 and had left lateral internal hemorrhoid re-banded.  He reports good symptom control of internal hemorrhoids for about 4 months but over the last month he's had recurrent symptoms. Currently no recent rectal bleeding but he was having rectal bleeding with bowel movement, fecal seepage, perianal irritation and prolapse. He denies constipation. Denies diarrhea. No abdominal pain. No melena.  He last had a colonoscopy on 04/20/2016 by Dr. Fuller Plan. This revealed internal and external hemorrhoids, a 6 mm sigmoid polyp, and to 4-5 mm sigmoid polyps, moderate left-sided diverticulosis. Polyps were tubular adenoma one fragment, hyperplastic 2 fragments.  Review of Systems As per history of present illness, otherwise negative  Current Medications, Allergies, Past Medical History, Past Surgical History, Family History and Social History were reviewed in [employer] record.     Objective:   Physical Exam BP 140/90 (BP Location: Left Arm, Patient Position: Sitting, Cuff Size: Normal)   Pulse 68   Ht 5\' 9"  (1.753 m)   Wt 130 lb 4 oz (59.1 kg)   BMI 19.23 kg/m  Constitutional: Well-developed and well-nourished. No distress. HEENT: Normocephalic and atraumatic.  Psychiatric: Normal mood and affect. Behavior is normal.     Assessment & Plan:  71 year old male with a  past medical history of symptomatic internal hemorrhoids who is seen for follow-up.  1. Symptomatic internal hemorrhoids -- patient has had early recurrence of symptoms despite hemorrhoidal banding now 4. In my opinion he has failed hemorrhoidal banding with recurrent sooner than expected. For this reason I recommended hemorrhoidectomy. We discussed surgical referral and he wishes to proceed. I will refer him to Dr. Johney Maine for evaluation and consideration of hemorrhoidectomy.  He will follow-up with [place], Dr. Fuller Plan, as needed3   15 minutes spent with the patient today. Greater than 50% was spent in counseling and coordination of care with the patient

## 2017-04-06 NOTE — Patient Instructions (Signed)
You have been scheduled for an appointment with Dr Michael Boston at [facility]. Your appointment is on ________________ at __________. Please arrive at _________ for registration. Make certain to bring a list of current medications, including any over the counter medications or vitamins. Also bring your co-pay if you have one as well as your insurance cards. [facility] is located at [address], [address]. Should you need to reschedule your appointment, please contact them at [phone].  If you are age 71 or older, your body mass index should be between 23-30. Your Body mass index is 19.23 kg/m. If this is out of the aforementioned range listed, please consider follow up with your Primary Care Provider.  If you are age 9 or younger, your body mass index should be between 19-25. Your Body mass index is 19.23 kg/m. If this is out of the aformentioned range listed, please consider follow up with your Primary Care Provider.

## 2017-04-12 ENCOUNTER — Telehealth: Payer: Self-pay

## 2017-04-12 NOTE — Telephone Encounter (Signed)
Pt scheduled with [facility] Dr. Johney Maine 04/26/17@10 :15am, pt to arrive there at 9:45am. Pt aware.

## 2017-04-13 DIAGNOSIS — Z23 Encounter for immunization: Secondary | ICD-10-CM | POA: Diagnosis not present

## 2017-04-26 ENCOUNTER — Ambulatory Visit: Payer: Self-pay | Admitting: Surgery

## 2017-04-26 DIAGNOSIS — K642 Third degree hemorrhoids: Secondary | ICD-10-CM | POA: Diagnosis not present

## 2017-04-26 DIAGNOSIS — Z01818 Encounter for other preprocedural examination: Secondary | ICD-10-CM | POA: Diagnosis not present

## 2017-04-26 DIAGNOSIS — K641 Second degree hemorrhoids: Secondary | ICD-10-CM | POA: Diagnosis not present

## 2017-04-26 DIAGNOSIS — K644 Residual hemorrhoidal skin tags: Secondary | ICD-10-CM | POA: Diagnosis not present

## 2017-04-26 DIAGNOSIS — K648 Other hemorrhoids: Secondary | ICD-10-CM | POA: Diagnosis not present

## 2017-04-26 NOTE — H&P (Signed)
Hunter Boone 04/26/2017 10:29 AM Location: [facility] Patient #: [id number] DOB: October 07, 1945 Married / Language: Hunter Boone / Race: White Male  History of Present Illness [name] MD; 04/26/2017 11:13 AM) The patient is a 71 year old male who presents with hemorrhoids. Note for "Hemorrhoids": ` ` ` Patient sent for surgical consultation at the request of Dr. Zenovia Jarred, [facility]  Chief Complaint: Persistent hemorrhoidal problems despite banding 4  The patient is a 71 year old 24 male that has had hemorrhoid issues especially the past few years. Started getting some bleeding. Underwent banding by his gastroenterologist. 3 bandings completed by February 2018. Then had recurrence. Re-banded again this summer. Still with recurrent symptoms. Felt by GI that he had failed banding therapy. Surgical consultation requested. Smokes about 1 or 2 pipes a day. No prior anorectal or abdominal surgery. Main issue is blood with bowel movements and irritation. Occasionally gets some soiling. Occasional itching. He had some partial relief with the banding's but keeps coming back. Usually moves his bowels twice a day. Soft and regular. He focuses on a high-fiber diet. No heart or lung issues  No personal nor family history of GI/colon cancer, inflammatory bowel disease, irritable bowel syndrome, allergy such as Celiac Sprue, dietary/dairy problems, colitis, ulcers nor gastritis. No recent sick contacts/gastroenteritis. No travel outside the country. No changes in diet. No dysphagia to solids or liquids. No significant heartburn or reflux. No hematochezia, hematemesis, coffee ground emesis. No evidence of prior gastric/peptic ulceration.  (Review of systems as stated in this history (HPI) or in the review of systems. Otherwise all other 12 point ROS are negative)   Allergies [name], LPN; 67/89/3810 17:51 AM) Allergies  Reconciled  Medication History [name], LPN; 02/58/5277 82:42 AM) Aspirin (81MG  Tablet, Oral daily) Active. Losartan Potassium (100MG  Tablet, Oral) Active. Lutein (Oral) Specific strength unknown - Active. Multiple Vitamin (Oral daily) Active. Fish Oil + D3 (1200-1000MG -UNIT Capsule, Oral daily) Active. Saw Palmetto (Serenoa repens) (450MG  Capsule, Oral daily) Active. Medications Reconciled    Vitals Claiborne Billings [facility] LPN; 35/36/1443 15:40 AM) 04/26/2017 10:29 AM Weight: 131 lb Height: 72in Body Surface Area: 1.78 m Body Mass Index: 17.77 kg/m  Temp.: 97.15F(Oral)  Pulse: 50 (Regular)  BP: 138/80 (Sitting, Right Arm, Standard)      Physical Exam [name] MD; 04/26/2017 10:52 AM)  General Mental Status-Alert. General Appearance-Not in acute distress, Not Sickly. Orientation-Oriented X3. Hydration-Well hydrated. Voice-Normal.  Integumentary Global Assessment Upon inspection and palpation of skin surfaces of the - Axillae: non-tender, no inflammation or ulceration, no drainage. and Distribution of scalp and body hair is normal. General Characteristics Temperature - normal warmth is noted.  Head and Neck Head-normocephalic, atraumatic with no lesions or palpable masses. Face Global Assessment - atraumatic, no absence of expression. Neck Global Assessment - no abnormal movements, no bruit auscultated on the right, no bruit auscultated on the left, no decreased range of motion, non-tender. Trachea-midline. Thyroid Gland Characteristics - non-tender.  Eye Eyeball - Left-Extraocular movements intact, No Nystagmus. Eyeball - Right-Extraocular movements intact, No Nystagmus. Cornea - Left-No Hazy. Cornea - Right-No Hazy. Sclera/Conjunctiva - Left-No scleral icterus, No Discharge. Sclera/Conjunctiva - Right-No scleral icterus, No Discharge. Pupil - Left-Direct reaction to light normal. Pupil - Right-Direct  reaction to light normal. Note: Wears glasses. Vision corrected  ENMT Ears Pinna - Left - no drainage observed, no generalized tenderness observed. Right - no drainage observed, no generalized tenderness observed. Nose and Sinuses External Inspection of the Nose - no destructive  lesion observed. Inspection of the nares - Left - quiet respiration. Right - quiet respiration. Mouth and Throat Lips - Upper Lip - no fissures observed, no pallor noted. Lower Lip - no fissures observed, no pallor noted. Nasopharynx - no discharge present. Oral Cavity/Oropharynx - Tongue - no dryness observed. Oral Mucosa - no cyanosis observed. Hypopharynx - no evidence of airway distress observed.  Chest and Lung Exam Inspection Movements - Normal and Symmetrical. Accessory muscles - No use of accessory muscles in breathing. Palpation Palpation of the chest reveals - Non-tender. Auscultation Breath sounds - Normal and Clear.  Cardiovascular Auscultation Rhythm - Regular. Murmurs & Other Heart Sounds - Auscultation of the heart reveals - No Murmurs and No Systolic Clicks.  Abdomen Inspection Inspection of the abdomen reveals - No Visible peristalsis and No Abnormal pulsations. Umbilicus - No Bleeding, No Urine drainage. Palpation/Percussion Palpation and Percussion of the abdomen reveal - Soft, Non Tender, No Rebound tenderness, No Rigidity (guarding) and No Cutaneous hyperesthesia. Note: Abdomen soft. Nontender. Not distended. No umbilical or incisional hernias. No guarding.  Male Genitourinary Sexual Maturity Tanner 5 - Adult hair pattern and Adult penile size and shape. Note: No inguinal hernias. Normal external genitalia. Epididymi, testes, and spermatic cords normal without any masses.  Rectal Note: Please refer to anoscopy section. Enlarged hemorrhoids especially right posterior and left lateral with external left lateral hemorrhoid  Peripheral Vascular Upper Extremity Inspection - Left  - No Cyanotic nailbeds, Not Ischemic. Right - No Cyanotic nailbeds, Not Ischemic.  Neurologic Neurologic evaluation reveals -normal attention span and ability to concentrate, able to name objects and repeat phrases. Appropriate fund of knowledge , normal sensation and normal coordination. Mental Status Affect - not angry, not paranoid. Cranial Nerves-Normal Bilaterally. Gait-Normal.  Neuropsychiatric Mental status exam performed with findings of-able to articulate well with normal speech/language, rate, volume and coherence, thought content normal with ability to perform basic computations and apply abstract reasoning and no evidence of hallucinations, delusions, obsessions or homicidal/suicidal ideation.  Musculoskeletal Global Assessment Spine, Ribs and Pelvis - no instability, subluxation or laxity. Right Upper Extremity - no instability, subluxation or laxity.  Lymphatic Head & Neck  General Head & Neck Lymphatics: Bilateral - Description - No Localized lymphadenopathy. Axillary  General Axillary Region: Bilateral - Description - No Localized lymphadenopathy. Femoral & Inguinal  Generalized Femoral & Inguinal Lymphatics: Left - Description - No Localized lymphadenopathy. Right - Description - No Localized lymphadenopathy.   Results [name] MD; 04/26/2017 11:09 AM) Procedures  Name Value Date Hemorrhoids Procedure Anal exam: External Hemorrhoid prolapse Internal exam: Internal Hemorroids ( non-bleeding) Other: External hemorrhoidal tags especially left lateral. Left lateral internal hemorrhoid grade 3 partially prolapsing. Inflamed grade 2 right posterior hemorrhoid. More scarred down right anterior. Normal sphincter tone. Perianal skin clean with good hygiene. No pruritis ani. No pilonidal disease. No fissure. No abscess/fistula. Normal sphincter tone. No condyloma warts. Tolerates digital and anoscopic rectal exam. Prostate mildly  enlarged but smooth. No rectal masses. Definite scarring right greater than left anal canal consistent with prior bandings.  Performed: 04/26/2017 10:53 AM    Assessment & Plan [name] MD; 04/26/2017 11:09 AM)  PROLAPSED INTERNAL HEMORRHOIDS, GRADE 3 (K64.2) Impression: Irritated internal hemorrhoids despite banding 4. Left lateral partially prolapsing.  I agree he's failed nonoperative management. Think he would benefit from operative hemorrhoidal ligation and pexy. Hemorrhoidectomy of remaining abnormal tissues. At least the left lateral pile. Probably the right posterior as well. Outpatient surgery.  The anatomy &  physiology of the anorectal region was discussed. The pathophysiology of hemorrhoids and differential diagnosis was discussed. Natural history progression was discussed. I stressed the importance of a bowel regimen to have daily soft bowel movements to minimize progression of disease. Goal of one BM / day ideal. Use of wet wipes, warm baths, avoiding straining, etc were emphasized.  Educational handouts further explaining the pathology, treatment options, and bowel regimen were given as well. The patient expressed understanding.  Current Plans ANOSCOPY, DIAGNOSTIC (64680) Pt Education - [facility] Hemorrhoids (Dymond Gutt): discussed with patient and provided information. Started Anusol-HC 3.2%, 1 (one) Application four times daily, as needed, 1 Tube, 04/26/2017, Ref. x5. PROLAPSED INTERNAL HEMORRHOIDS, GRADE 2 (K64.1)  INTERNAL BLEEDING HEMORRHOIDS (K64.8)  EXTERNAL HEMORRHOIDS WITH COMPLICATION (Z22.4)  ENCOUNTER FOR PREOPERATIVE EXAMINATION FOR GENERAL SURGICAL PROCEDURE (Z01.818)  Current Plans You are being scheduled for surgery- Our schedulers will call you.  You should hear from our office's scheduling department within 5 working days about the location, date, and time of surgery. We try to make accommodations for patient's preferences in scheduling surgery, but  sometimes the OR schedule or the surgeon's schedule prevents [place] from making those accommodations.  If you have not heard from our office [phone]) in 5 working days, call the office and ask for your surgeon's nurse.  If you have other questions about your diagnosis, plan, or surgery, call the office and ask for your surgeon's nurse.  Pt Education - [facility] Rectal Prep for Anorectal outpatient/office surgery: discussed with patient and provided information. Pt Education - [facility] Rectal Surgery HCI (Daeveon Zweber): discussed with patient and provided information.  [name], M.D., F.A.C.S. Gastrointestinal and Minimally Invasive Surgery [facility] [address], [place], [place] [address] [phone] Main / Paging

## 2017-05-12 ENCOUNTER — Ambulatory Visit (INDEPENDENT_AMBULATORY_CARE_PROVIDER_SITE_OTHER): Payer: Medicare Other | Admitting: Internal Medicine

## 2017-05-12 ENCOUNTER — Encounter: Payer: Self-pay | Admitting: Internal Medicine

## 2017-05-12 VITALS — BP 148/90 | HR 72 | Temp 98.3°F | Ht 69.0 in | Wt 133.0 lb

## 2017-05-12 DIAGNOSIS — K121 Other forms of stomatitis: Secondary | ICD-10-CM | POA: Diagnosis not present

## 2017-05-12 MED ORDER — TRIAMCINOLONE ACETONIDE 0.1 % MT PSTE: 1.0000 "application " | PASTE | Freq: Two times a day (BID) | OROMUCOSAL | 12 refills | Status: DC

## 2017-05-12 NOTE — Progress Notes (Signed)
   Subjective:    Patient ID: Hunter Boone, male    DOB: [birth year], 71 y.o.   MRN: [id number]  HPI  Here to f/u with c/o 4-5 days onset painful left mouth ulceration, etiology not clear, no trauma or dental problem, no fever, swelling or drainage.  Today incidentally feels better. Pt denies chest pain, increased sob or doe, wheezing, orthopnea, PND, increased LE swelling, palpitations, dizziness or syncope.   Pt denies polydipsia, polyuria. No prior hx of apthous ulcer or other recent lesion Past Medical History:  Diagnosis Date  . Hypertension   . Internal hemorrhoids   . Macular degeneration   . Tubular adenoma of colon    Past Surgical History:  Procedure Laterality Date  . adenomatous polyps  2009, 2011,2014    Dr Fuller Plan;   . COLONOSCOPY    . POLYPECTOMY      reports that he has been smoking cigarettes and pipe.  He has been smoking about 0.50 packs per day. he has never used smokeless tobacco. He reports that he drinks about 4.2 oz of alcohol per week. He reports that he does not use drugs. family history includes Diabetes in his father and mother; Heart attack in his father; Liver cancer in his maternal aunt. No Known Allergies Current Outpatient Medications on File Prior to Visit  Medication Sig Dispense Refill  . aspirin 81 MG tablet Take 81 mg by mouth daily.      . hydrocortisone (ANUSOL-HC) 2.5 % rectal cream Place 1 application rectally 2 (two) times daily. Use 7-14 days 30 g 0  . losartan (COZAAR) 100 MG tablet Take 1 tablet (100 mg total) by mouth daily. 90 tablet 3  . LUTEIN PO Take 1 capsule by mouth daily.     . Multiple Vitamins-Minerals (CENTRUM PO) Take 1 tablet by mouth daily.     . Multiple Vitamins-Minerals (PRESERVISION AREDS 2 PO) Take 1 capsule by mouth every morning.     . Omega-3 Fatty Acids (FISH OIL) 1200 MG CAPS Take 1,200 mg by mouth daily.     . Saw Palmetto 450 MG CAPS Take 450 mg by mouth daily.      No current facility-administered medications on  file prior to visit.    Review of Systems All otherwise neg per pt    Objective:   Physical Exam BP (!) 148/90   Pulse 72   Temp 98.3 F (36.8 C) (Oral)   Ht 5\' 9"  (1.753 m)   Wt 133 lb (60.3 kg)   SpO2 97%   BMI 19.64 kg/m  VS noted,  Constitutional: Pt appears in NAD HENT: Head: NCAT.  Right Ear: External ear normal.  Left Ear: External ear normal.  Eyes: . Pupils are equal, round, and reactive to light. Conjunctivae and EOM are normal Nose: without d/c or deformity Mouth:  No visible ulcer though has some tenderness to the area left inner mouth near lower jawline mid aspect Neck: Neck supple. Gross normal ROM Cardiovascular: Normal rate and regular rhythm.   Pulmonary/Chest: Effort normal and breath sounds without rales or wheezing.  Neurological: Pt is alert. At baseline orientation, motor grossly intact Skin: Skin is warm. No rashes, other new lesions, no LE edema Psychiatric: Pt behavior is normal without agitation  No other exam findings    Assessment & Plan:

## 2017-05-12 NOTE — Patient Instructions (Signed)
Please take all new medication as prescribed - the paste   Please continue all other medications as before, and refills have been done if requested.  Please have the pharmacy call with any other refills you may need.  Please keep your appointments with your specialists as you may have planned

## 2017-05-12 NOTE — Assessment & Plan Note (Signed)
Etiology unclear, improved today, for kenalog in orabase prn asd, consider oral surgeon f/u for recurrence

## 2017-05-27 ENCOUNTER — Ambulatory Visit: Admit: 2017-05-27 | Payer: Medicare Other | Admitting: Surgery

## 2017-05-27 SURGERY — EXAM UNDER ANESTHESIA WITH HEMORRHOIDECTOMY
Anesthesia: General

## 2017-05-30 ENCOUNTER — Other Ambulatory Visit: Payer: Self-pay | Admitting: Emergency Medicine

## 2017-05-30 DIAGNOSIS — I10 Essential (primary) hypertension: Secondary | ICD-10-CM

## 2017-05-30 MED ORDER — LOSARTAN POTASSIUM 100 MG PO TABS: 100.0000 mg | ORAL_TABLET | Freq: Every day | ORAL | 2 refills | Status: DC

## 2017-06-20 ENCOUNTER — Telehealth: Payer: Self-pay | Admitting: Internal Medicine

## 2017-06-20 MED ORDER — FLUTICASONE PROPIONATE 50 MCG/ACT NA SUSP
2.0000 | Freq: Every day | NASAL | 3 refills | Status: DC
Start: 2017-06-20 — End: 2018-08-25

## 2017-06-20 NOTE — Telephone Encounter (Signed)
Prescription sent

## 2017-06-20 NOTE — Telephone Encounter (Signed)
Copied from [place]. Topic: Quick Communication - Rx Refill/Question >> Jun 20, 2017  1:12 PM Yvette Rack wrote: Has the patient contacted their pharmacy? No.   (Agent: If no, request that the patient contact the pharmacy for the refill.) fluticasone prop Spray 55mcg   Preferred Pharmacy (with phone number or street name): [facility]  [place] complete ID # [id number]  Agent: Please be advised that RX refills may take up to 3 business days. We ask that you follow-up with your pharmacy.

## 2017-06-20 NOTE — Telephone Encounter (Signed)
Refill for fluticasone pro spray 60mcg. I do not see it on his list of medications. Please advise.

## 2017-06-28 NOTE — Patient Instructions (Signed)
Hunter Boone  06/28/2017   Your procedure is scheduled on: 07/01/17   Report to [facility] Main  Entrance  Report to admitting at   0730 AM   Call this number if you have problems the morning of surgery [phone]   Remember: Do not eat food or drink liquids :After Midnight.  Follow Prep Instructions:  Begin liquids 1-2 days prior to surgery.  MOM- 2 ounces or 8 Tablespoons at 100pm day prior to surgery.  If no results repeat in 2 hours.  Am of surgery- Fleets enema.    Take these medicines the morning of surgery with A SIP OF WATER: Flonase                                 You may not have any metal on your body including hair pins and              piercings  Do not wear jewelry,  lotions, powders or perfumes, deodorant                           Men may shave face and neck.   Do not bring valuables to the hospital. [place].  Contacts, dentures or bridgework may not be worn into surgery.       Patients discharged the day of surgery will not be allowed to drive home.  Name and phone number of your driver:                Please read over the following fact sheets you were given: _____________________________________________________________________             [facility] - Preparing for Surgery Before surgery, you can play an important role.  Because skin is not sterile, your skin needs to be as free of germs as possible.  You can reduce the number of germs on your skin by washing with CHG (chlorahexidine gluconate) soap before surgery.  CHG is an antiseptic cleaner which kills germs and bonds with the skin to continue killing germs even after washing. Please DO NOT use if you have an allergy to CHG or antibacterial soaps.  If your skin becomes reddened/irritated stop using the CHG and inform your nurse when you arrive at Short Stay. Do not shave (including legs and underarms) for at least 48 hours  prior to the first CHG shower.  You may shave your face/neck. Please follow these instructions carefully:  1.  Shower with CHG Soap the night before surgery and the  morning of Surgery.  2.  If you choose to wash your hair, wash your hair first as usual with your  normal  shampoo.  3.  After you shampoo, rinse your hair and body thoroughly to remove the  shampoo.                           4.  Use CHG as you would any other liquid soap.  You can apply chg directly  to the skin and wash                       Gently with  a scrungie or clean washcloth.  5.  Apply the CHG Soap to your body ONLY FROM THE NECK DOWN.   Do not use on face/ open                           Wound or open sores. Avoid contact with eyes, ears mouth and genitals (private parts).                       Wash face,  Genitals (private parts) with your normal soap.             6.  Wash thoroughly, paying special attention to the area where your surgery  will be performed.  7.  Thoroughly rinse your body with warm water from the neck down.  8.  DO NOT shower/wash with your normal soap after using and rinsing off  the CHG Soap.                9.  Pat yourself dry with a clean towel.            10.  Wear clean pajamas.            11.  Place clean sheets on your bed the night of your first shower and do not  sleep with pets. Day of Surgery : Do not apply any lotions/deodorants the morning of surgery.  Please wear clean clothes to the hospital/surgery center.  FAILURE TO FOLLOW THESE INSTRUCTIONS MAY RESULT IN THE CANCELLATION OF YOUR SURGERY PATIENT SIGNATURE_________________________________  NURSE SIGNATURE__________________________________  ________________________________________________________________________ NO SOLID FOOD AFTER MIDNIGHT THE NIGHT PRIOR TO SRRGERY. NOTHING BY MOUTH EXCEPT CLEAR LIQUIDS UNTIL 3 HOURS PRIOR TO [place] SURGERY. PLEASE FINISH ENSURE DRINK PER SURGEON ORDER 3 HOURS PRIOR TO SCHEDULED SURGERY TIME  WHICH NEEDS TO BE COMPLETED AT _____0630_______.

## 2017-06-29 ENCOUNTER — Encounter (HOSPITAL_COMMUNITY)
Admission: RE | Admit: 2017-06-29 | Discharge: 2017-06-29 | Disposition: A | Discharge status: Home / Self Care | Payer: Medicare Other | Source: Ambulatory Visit | Attending: Surgery | Admitting: Surgery

## 2017-06-29 ENCOUNTER — Encounter (HOSPITAL_COMMUNITY): Payer: Self-pay

## 2017-06-29 ENCOUNTER — Other Ambulatory Visit: Payer: Self-pay

## 2017-06-29 DIAGNOSIS — F17290 Nicotine dependence, other tobacco product, uncomplicated: Secondary | ICD-10-CM | POA: Diagnosis not present

## 2017-06-29 DIAGNOSIS — I10 Essential (primary) hypertension: Secondary | ICD-10-CM | POA: Diagnosis not present

## 2017-06-29 DIAGNOSIS — K642 Third degree hemorrhoids: Secondary | ICD-10-CM | POA: Diagnosis present

## 2017-06-29 DIAGNOSIS — K643 Fourth degree hemorrhoids: Secondary | ICD-10-CM | POA: Diagnosis not present

## 2017-06-29 DIAGNOSIS — Z7982 Long term (current) use of aspirin: Secondary | ICD-10-CM | POA: Diagnosis not present

## 2017-06-29 DIAGNOSIS — Z79899 Other long term (current) drug therapy: Secondary | ICD-10-CM | POA: Diagnosis not present

## 2017-06-29 DIAGNOSIS — K644 Residual hemorrhoidal skin tags: Secondary | ICD-10-CM | POA: Diagnosis not present

## 2017-06-29 DIAGNOSIS — K641 Second degree hemorrhoids: Secondary | ICD-10-CM | POA: Diagnosis not present

## 2017-06-29 LAB — BASIC METABOLIC PANEL
ANION GAP: 10 (ref 5–15)
BUN: 13 mg/dL (ref 6–20)
CALCIUM: 9 mg/dL (ref 8.9–10.3)
CO2: 26 mmol/L (ref 22–32)
Chloride: 96 mmol/L — ABNORMAL LOW (ref 101–111)
Creatinine, Ser: 0.62 mg/dL (ref 0.61–1.24)
GLUCOSE: 100 mg/dL — AB (ref 65–99)
Potassium: 4.7 mmol/L (ref 3.5–5.1)
SODIUM: 132 mmol/L — AB (ref 135–145)

## 2017-06-29 LAB — CBC
HCT: 41.1 % (ref 39.0–52.0)
HEMOGLOBIN: 14 g/dL (ref 13.0–17.0)
MCH: 32.2 pg (ref 26.0–34.0)
MCHC: 34.1 g/dL (ref 30.0–36.0)
MCV: 94.5 fL (ref 78.0–100.0)
Platelets: 317 10*3/uL (ref 150–400)
RBC: 4.35 MIL/uL (ref 4.22–5.81)
RDW: 13.6 % (ref 11.5–15.5)
WBC: 9.2 10*3/uL (ref 4.0–10.5)

## 2017-06-29 NOTE — Progress Notes (Addendum)
EKG-02/02/17-epic  Ct Chest- epic -04/04/17-epic

## 2017-06-29 NOTE — Progress Notes (Signed)
CT of Chest done 03/2017 shown to DR Ola Spurr ( anesthesia).  No new orders given.

## 2017-06-29 NOTE — Progress Notes (Signed)
Patient at preop appointment for surgery on 06/29/17.  Surgery scheduled for 07/01/17.  Initial blood pressure in right arm at 1000am was 179/104.  At 1030am blood pressure reading in right arm was 166/84.  Patient stated he had taken blood pressure medication this am.  Patient voiced no complaints. FYI.  CBC and BMP done at time of preop appt on 06/29/17.

## 2017-06-29 NOTE — Progress Notes (Signed)
Reviewed preop bowel prep as per order in epic with patient and wife at time of preop appointment.  Written instructions also given to include instructions regarding Ensure preop drink.  Patient given one Ensure preop drink along with copy of written instructions.  Patient and wife voiced understanding.

## 2017-06-30 NOTE — Anesthesia Preprocedure Evaluation (Addendum)
Anesthesia Evaluation  Patient identified by MRN, date of birth, ID band Patient awake    Reviewed: Allergy & Precautions, NPO status , Patient's Chart, lab work & pertinent test results  Airway Mallampati: II  TM Distance: >3 FB Neck ROM: Full    Dental no notable dental hx.    Pulmonary neg pulmonary ROS, COPD, Current Smoker,    Pulmonary exam normal breath sounds clear to auscultation       Cardiovascular hypertension, negative cardio ROS Normal cardiovascular exam Rhythm:Regular Rate:Normal     Neuro/Psych negative neurological ROS  negative psych ROS   GI/Hepatic negative GI ROS, Neg liver ROS,   Endo/Other  negative endocrine ROS  Renal/GU negative Renal ROS  negative genitourinary   Musculoskeletal negative musculoskeletal ROS (+)   Abdominal   Peds negative pediatric ROS (+)  Hematology negative hematology ROS (+)   Anesthesia Other Findings   Reproductive/Obstetrics negative OB ROS                             Anesthesia Physical Anesthesia Plan  ASA: III  Anesthesia Plan: General   Post-op Pain Management:    Induction: Intravenous  PONV Risk Score and Plan: 1 and Treatment may vary due to age or medical condition, Ondansetron and Midazolam  Airway Management Planned: Oral ETT and LMA  Additional Equipment:   Intra-op Plan:   Post-operative Plan: Extubation in OR  Informed Consent: I have reviewed the patients History and Physical, chart, labs and discussed the procedure including the risks, benefits and alternatives for the proposed anesthesia with the patient or authorized representative who has indicated his/her understanding and acceptance.   Dental advisory given  Plan Discussed with: CRNA, Anesthesiologist and Surgeon  Anesthesia Plan Comments: (  )      Anesthesia Quick Evaluation

## 2017-07-01 ENCOUNTER — Ambulatory Visit (HOSPITAL_COMMUNITY): Payer: Medicare Other | Admitting: Anesthesiology

## 2017-07-01 ENCOUNTER — Other Ambulatory Visit: Payer: Self-pay

## 2017-07-01 ENCOUNTER — Ambulatory Visit (HOSPITAL_COMMUNITY)
Admission: RE | Admit: 2017-07-01 | Discharge: 2017-07-01 | Disposition: A | Discharge status: Home / Self Care | Payer: Medicare Other | Source: Ambulatory Visit | Attending: Surgery | Admitting: Surgery

## 2017-07-01 ENCOUNTER — Encounter (HOSPITAL_COMMUNITY)
Admission: RE | Disposition: A | Discharge status: Home / Self Care | Payer: Self-pay | Source: Ambulatory Visit | Attending: Surgery

## 2017-07-01 ENCOUNTER — Encounter (HOSPITAL_COMMUNITY): Payer: Self-pay | Admitting: Emergency Medicine

## 2017-07-01 DIAGNOSIS — Z7982 Long term (current) use of aspirin: Secondary | ICD-10-CM | POA: Diagnosis not present

## 2017-07-01 DIAGNOSIS — K641 Second degree hemorrhoids: Secondary | ICD-10-CM | POA: Insufficient documentation

## 2017-07-01 DIAGNOSIS — K6289 Other specified diseases of anus and rectum: Secondary | ICD-10-CM | POA: Diagnosis not present

## 2017-07-01 DIAGNOSIS — K643 Fourth degree hemorrhoids: Secondary | ICD-10-CM | POA: Diagnosis not present

## 2017-07-01 DIAGNOSIS — K644 Residual hemorrhoidal skin tags: Secondary | ICD-10-CM | POA: Insufficient documentation

## 2017-07-01 DIAGNOSIS — I10 Essential (primary) hypertension: Secondary | ICD-10-CM | POA: Diagnosis not present

## 2017-07-01 DIAGNOSIS — Z79899 Other long term (current) drug therapy: Secondary | ICD-10-CM | POA: Diagnosis not present

## 2017-07-01 DIAGNOSIS — K642 Third degree hemorrhoids: Secondary | ICD-10-CM

## 2017-07-01 DIAGNOSIS — F17290 Nicotine dependence, other tobacco product, uncomplicated: Secondary | ICD-10-CM | POA: Insufficient documentation

## 2017-07-01 DIAGNOSIS — K649 Unspecified hemorrhoids: Secondary | ICD-10-CM | POA: Diagnosis not present

## 2017-07-01 DIAGNOSIS — R911 Solitary pulmonary nodule: Secondary | ICD-10-CM | POA: Diagnosis not present

## 2017-07-01 HISTORY — PX: EVALUATION UNDER ANESTHESIA WITH HEMORRHOIDECTOMY: SHX5624

## 2017-07-01 SURGERY — EXAM UNDER ANESTHESIA WITH HEMORRHOIDECTOMY
Anesthesia: General

## 2017-07-01 MED ORDER — 0.9 % SODIUM CHLORIDE (POUR BTL) OPTIME
TOPICAL | Status: DC | PRN
  Administered 2017-07-01: 1000 mL

## 2017-07-01 MED ORDER — MEPERIDINE HCL 50 MG/ML IJ SOLN: 6.2500 mg | INTRAMUSCULAR | Status: DC | PRN

## 2017-07-01 MED ORDER — BUPIVACAINE-EPINEPHRINE (PF) 0.5% -1:200000 IJ SOLN
INTRAMUSCULAR | Status: AC
  Filled 2017-07-01: qty 30

## 2017-07-01 MED ORDER — EPHEDRINE SULFATE 50 MG/ML IJ SOLN
INTRAMUSCULAR | Status: DC | PRN
  Administered 2017-07-01 (×2): 10 mg via INTRAVENOUS

## 2017-07-01 MED ORDER — BUPIVACAINE-EPINEPHRINE (PF) 0.5% -1:200000 IJ SOLN
INTRAMUSCULAR | Status: DC | PRN
  Administered 2017-07-01: 30 mL

## 2017-07-01 MED ORDER — ONDANSETRON HCL 4 MG/2ML IJ SOLN
INTRAMUSCULAR | Status: DC | PRN
Start: 2017-07-01 — End: 2017-07-01
  Administered 2017-07-01: 4 mg via INTRAVENOUS

## 2017-07-01 MED ORDER — ACETAMINOPHEN 500 MG PO TABS
1000.0000 mg | ORAL_TABLET | ORAL | Status: AC
  Administered 2017-07-01: 1000 mg via ORAL
  Filled 2017-07-01: qty 2

## 2017-07-01 MED ORDER — LIDOCAINE 2% (20 MG/ML) 5 ML SYRINGE
INTRAMUSCULAR | Status: AC
  Filled 2017-07-01: qty 5

## 2017-07-01 MED ORDER — FENTANYL CITRATE (PF) 100 MCG/2ML IJ SOLN
INTRAMUSCULAR | Status: AC
  Filled 2017-07-01: qty 2

## 2017-07-01 MED ORDER — PROPOFOL 10 MG/ML IV BOLUS
INTRAVENOUS | Status: DC | PRN
  Administered 2017-07-01: 150 mg via INTRAVENOUS

## 2017-07-01 MED ORDER — DIBUCAINE 1 % RE OINT
TOPICAL_OINTMENT | RECTAL | Status: AC
  Filled 2017-07-01: qty 28

## 2017-07-01 MED ORDER — BUPIVACAINE LIPOSOME 1.3 % IJ SUSP
20.0000 mL | INTRAMUSCULAR | Status: DC
  Filled 2017-07-01: qty 20

## 2017-07-01 MED ORDER — CHLORHEXIDINE GLUCONATE CLOTH 2 % EX PADS: 6.0000 | MEDICATED_PAD | Freq: Once | CUTANEOUS | Status: DC

## 2017-07-01 MED ORDER — METRONIDAZOLE IN NACL 5-0.79 MG/ML-% IV SOLN
500.0000 mg | INTRAVENOUS | Status: AC
  Administered 2017-07-01: 500 mg via INTRAVENOUS
  Filled 2017-07-01 (×2): qty 100

## 2017-07-01 MED ORDER — SUGAMMADEX SODIUM 200 MG/2ML IV SOLN
INTRAVENOUS | Status: AC
  Filled 2017-07-01: qty 2

## 2017-07-01 MED ORDER — DEXAMETHASONE SODIUM PHOSPHATE 10 MG/ML IJ SOLN
INTRAMUSCULAR | Status: AC
  Filled 2017-07-01: qty 1

## 2017-07-01 MED ORDER — LACTATED RINGERS IV SOLN
INTRAVENOUS | Status: DC
  Administered 2017-07-01 (×2): via INTRAVENOUS

## 2017-07-01 MED ORDER — DEXAMETHASONE SODIUM PHOSPHATE 10 MG/ML IJ SOLN
INTRAMUSCULAR | Status: DC | PRN
  Administered 2017-07-01: 10 mg via INTRAVENOUS

## 2017-07-01 MED ORDER — OXYCODONE HCL 5 MG PO TABS: 5.0000 mg | ORAL_TABLET | ORAL | 0 refills | Status: DC | PRN

## 2017-07-01 MED ORDER — ONDANSETRON HCL 4 MG/2ML IJ SOLN
INTRAMUSCULAR | Status: AC
  Filled 2017-07-01: qty 2

## 2017-07-01 MED ORDER — ROCURONIUM BROMIDE 50 MG/5ML IV SOSY
PREFILLED_SYRINGE | INTRAVENOUS | Status: DC | PRN
  Administered 2017-07-01: 40 mg via INTRAVENOUS

## 2017-07-01 MED ORDER — SUCCINYLCHOLINE CHLORIDE 200 MG/10ML IV SOSY
PREFILLED_SYRINGE | INTRAVENOUS | Status: AC
  Filled 2017-07-01: qty 10

## 2017-07-01 MED ORDER — SUGAMMADEX SODIUM 200 MG/2ML IV SOLN
INTRAVENOUS | Status: DC | PRN
Start: 2017-07-01 — End: 2017-07-01
  Administered 2017-07-01: 200 mg via INTRAVENOUS

## 2017-07-01 MED ORDER — GABAPENTIN 300 MG PO CAPS
300.0000 mg | ORAL_CAPSULE | ORAL | Status: AC
  Administered 2017-07-01: 300 mg via ORAL
  Filled 2017-07-01: qty 1

## 2017-07-01 MED ORDER — ROCURONIUM BROMIDE 50 MG/5ML IV SOSY
PREFILLED_SYRINGE | INTRAVENOUS | Status: AC
  Filled 2017-07-01: qty 5

## 2017-07-01 MED ORDER — CEFAZOLIN SODIUM-DEXTROSE 2-4 GM/100ML-% IV SOLN
2.0000 g | INTRAVENOUS | Status: AC
  Administered 2017-07-01: 2 g via INTRAVENOUS
  Filled 2017-07-01: qty 100

## 2017-07-01 MED ORDER — HYDROCORTISONE ACE-PRAMOXINE 2.5-1 % RE CREA: 1.0000 "application " | TOPICAL_CREAM | Freq: Four times a day (QID) | RECTAL | 2 refills | Status: DC

## 2017-07-01 MED ORDER — FENTANYL CITRATE (PF) 100 MCG/2ML IJ SOLN: 25.0000 ug | INTRAMUSCULAR | Status: DC | PRN

## 2017-07-01 MED ORDER — LIDOCAINE 2% (20 MG/ML) 5 ML SYRINGE
INTRAMUSCULAR | Status: DC | PRN
Start: 2017-07-01 — End: 2017-07-01
  Administered 2017-07-01: 100 mg via INTRAVENOUS

## 2017-07-01 MED ORDER — EPHEDRINE 5 MG/ML INJ
INTRAVENOUS | Status: AC
  Filled 2017-07-01: qty 10

## 2017-07-01 MED ORDER — BUPIVACAINE LIPOSOME 1.3 % IJ SUSP
INTRAMUSCULAR | Status: DC | PRN
  Administered 2017-07-01: 20 mL

## 2017-07-01 MED ORDER — DIBUCAINE 1 % RE OINT
TOPICAL_OINTMENT | RECTAL | Status: DC | PRN
  Administered 2017-07-01: 1 via RECTAL

## 2017-07-01 MED ORDER — FENTANYL CITRATE (PF) 100 MCG/2ML IJ SOLN
INTRAMUSCULAR | Status: DC | PRN
  Administered 2017-07-01 (×2): 50 ug via INTRAVENOUS

## 2017-07-01 SURGICAL SUPPLY — 35 items
APL SKNCLS STERI-STRIP NONHPOA (GAUZE/BANDAGES/DRESSINGS) ×1
BENZOIN TINCTURE PRP APPL 2/3 (GAUZE/BANDAGES/DRESSINGS) ×2 IMPLANT
BLADE SURG 15 STRL LF DISP TIS (BLADE) ×1 IMPLANT
BLADE SURG 15 STRL SS (BLADE) ×2
BNDG GAUZE ELAST 4 BULKY (GAUZE/BANDAGES/DRESSINGS) ×1 IMPLANT
BRIEF STRETCH FOR OB PAD LRG (UNDERPADS AND DIAPERS) ×2 IMPLANT
CONT SPEC 4OZ CLIKSEAL STRL BL (MISCELLANEOUS) ×2 IMPLANT
COVER SURGICAL LIGHT HANDLE (MISCELLANEOUS) ×2 IMPLANT
DECANTER SPIKE VIAL GLASS SM (MISCELLANEOUS) ×2 IMPLANT
DRAPE LAPAROTOMY T 102X78X121 (DRAPES) ×2 IMPLANT
DRSG PAD ABDOMINAL 8X10 ST (GAUZE/BANDAGES/DRESSINGS) IMPLANT
ELECT PENCIL ROCKER SW 15FT (MISCELLANEOUS) ×2 IMPLANT
ELECT REM PT RETURN 15FT ADLT (MISCELLANEOUS) ×2 IMPLANT
GAUZE SPONGE 4X4 12PLY STRL (GAUZE/BANDAGES/DRESSINGS) IMPLANT
GAUZE SPONGE 4X4 16PLY XRAY LF (GAUZE/BANDAGES/DRESSINGS) ×2 IMPLANT
GLOVE ECLIPSE 8.0 STRL XLNG CF (GLOVE) ×2 IMPLANT
GLOVE INDICATOR 8.0 STRL GRN (GLOVE) ×2 IMPLANT
GOWN STRL REUS W/TWL XL LVL3 (GOWN DISPOSABLE) ×4 IMPLANT
KIT BASIN OR (CUSTOM PROCEDURE TRAY) ×2 IMPLANT
LOOP VESSEL MAXI BLUE (MISCELLANEOUS) IMPLANT
LUBRICANT JELLY K Y 4OZ (MISCELLANEOUS) ×2 IMPLANT
NEEDLE HYPO 22GX1.5 SAFETY (NEEDLE) ×2 IMPLANT
PACK BASIC VI WITH GOWN DISP (CUSTOM PROCEDURE TRAY) ×2 IMPLANT
SCRUB TECHNI CARE 4 OZ NO DYE (MISCELLANEOUS) ×2 IMPLANT
SHEARS HARMONIC 9CM CVD (BLADE) IMPLANT
SUT CHROMIC 2 0 SH (SUTURE) ×1 IMPLANT
SUT CHROMIC 3 0 SH 27 (SUTURE) IMPLANT
SUT VIC AB 2-0 SH 27 (SUTURE)
SUT VIC AB 2-0 SH 27X BRD (SUTURE) IMPLANT
SUT VIC AB 2-0 UR6 27 (SUTURE) ×6 IMPLANT
SYR 20CC LL (SYRINGE) ×2 IMPLANT
SYR 3ML LL SCALE MARK (SYRINGE) ×2 IMPLANT
TOWEL OR 17X26 10 PK STRL BLUE (TOWEL DISPOSABLE) ×2 IMPLANT
TOWEL OR NON WOVEN STRL DISP B (DISPOSABLE) ×2 IMPLANT
YANKAUER SUCT BULB TIP 10FT TU (MISCELLANEOUS) ×2 IMPLANT

## 2017-07-01 NOTE — Transfer of Care (Signed)
Immediate Anesthesia Transfer of Care Note  Patient: Hunter Boone  Procedure(s) Performed: ANORECTAL EXAMINATION UNDER ANESTHESIA, HEMORRHOIDECTOMY, HEMORRHOIDAL LIGATION/PEXY (N/A )  Patient Location: PACU  Anesthesia Type:General  Level of Consciousness: awake and patient cooperative  Airway & Oxygen Therapy: Patient Spontanous Breathing and Patient connected to face mask oxygen  Post-op Assessment: Report given to RN and Post -op Vital signs reviewed and stable  Post vital signs: Reviewed and stable  Last Vitals:  Vitals:   07/01/17 0755  BP: (!) 155/83  Pulse: 68  Resp: 16  Temp: 36.6 C  SpO2: 98%    Last Pain:  Vitals:   07/01/17 0755  TempSrc: Oral      Patients Stated Pain Goal: 4 (98/92/11 9417)  Complications: No apparent anesthesia complications

## 2017-07-01 NOTE — Discharge Instructions (Signed)
ANORECTAL SURGERY:  POST OPERATIVE INSTRUCTIONS  ######################################################################  EAT Start with a pureed / full liquid diet After 24 hours, gradually transition to a high fiber diet.    CONTROL PAIN Control pain so you can tolerate bowel movements,  walk, sleep, tolerate sneezing/coughing, and go up/down stairs.   HAVE A BOWEL MOVEMENT DAILY Keep your bowels regular to avoid problems.   Taking a fiber supplement every day to keep bowels soft.   Try a laxative to override constipation. Use an antidairrheal to slow down diarrhea.   Call if not better after 2 tries  WALK Walk an hour a day.  Control your pain to do that.   CALL IF YOU HAVE PROBLEMS/CONCERNS Call if you are still struggling despite following these instructions. Call if you have concerns not answered by these instructions  ######################################################################    1. Take your usually prescribed home medications unless otherwise directed. 2. DIET: Follow a light bland diet the first 24 hours after arrival home, such as soup, liquids, crackers, etc.  Be sure to include lots of fluids daily.  Avoid fast food or heavy meals as your are more likely to get nauseated.  Eat a low fat the next few days after surgery.   3. PAIN CONTROL: a. Pain is best controlled by a usual combination of three different methods TOGETHER: i. Ice/Heat ii. Over the counter pain medication iii. Prescription pain medication b. Expect swelling and discomfort in the anus/rectal area.  Warm water baths (30-60 minutes up to 6 times a day, especially after bowel meovements) will help. Use ice for the first few days to help decrease swelling and bruising, then switch to heat such as warm towels, sitz baths, warm baths, etc to help relax tight/sore spots and speed recovery.  Some people prefer to use ice alone, heat alone, alternating between ice & heat.  Experiment to what works  for you.   c. It is helpful to take an over-the-counter pain medication regularly for the first few weeks.  Choose one of the following that works best for you: i. Naproxen (Aleve, etc)  Two 220mg  tabs twice a day ii. Ibuprofen (Advil, etc) Three 200mg  tabs four times a day (every meal & bedtime) iii. Acetaminophen (Tylenol, etc) 500-650mg  four times a day (every meal & bedtime) d. A  prescription for pain medication (such as oxycodone, hydrocodone, etc) should be given to you upon discharge.  Take your pain medication as prescribed.  i. If you are having problems/concerns with the prescription medicine (does not control pain, nausea, vomiting, rash, itching, etc), please call us [phone] to see if we need to switch you to a different pain medicine that will work better for you and/or control your side effect better. ii. If you need a refill on your pain medication, please contact your pharmacy.  They will contact our office to request authorization. Prescriptions will not be filled after 5 pm or on week-ends.  Use a Sitz Bath 4-8 times a day for relief   [employer] A sitz bath is a warm water bath taken in the sitting position that covers only the hips and buttocks. It may be used for either healing or hygiene purposes. Sitz baths are also used to relieve pain, itching, or muscle spasms. The water may contain medicine. Moist heat will help you heal and relax.  HOME CARE INSTRUCTIONS  Take 3 to 4 sitz baths a day. 1. Fill the bathtub half full with warm water. 2. Sit in the  water and open the drain a little. 3. Turn on the warm water to keep the tub half full. Keep the water running constantly. 4. Soak in the water for 15 to 20 minutes. 5. After the sitz bath, pat the affected area dry first.   4. KEEP YOUR BOWELS REGULAR a. The goal is one bowel movement a day b. Avoid getting constipated.  Between the surgery and the pain medications, it is common to experience some constipation.   Increasing fluid intake and taking a fiber supplement (such as Metamucil, Citrucel, FiberCon, MiraLax, etc) 2-3 times a day regularly will usually help prevent this problem from occurring.  A mild laxative (prune juice, Milk of Magnesia, MiraLax, etc) should be taken according to package directions if there are no bowel movements after 48 hours. c. Watch out for diarrhea.  If you have many loose bowel movements, simplify your diet to bland foods & liquids for a few days.  Stop any stool softeners and decrease your fiber supplement.  Switching to mild anti-diarrheal medications (Kayopectate, Pepto Bismol) can help.  If this worsens or does not improve, please call us.  5. Wound Care  a. Remove your bandages with your first bowel movement, usually the day after surgery.  You may have packing if you had an abscess.  Let any packing or gauze fall come out.   b. Wear an absorbent pad or soft cotton balls in your underwear as needed to catch any drainage and help keep the area  c. Keep the area clean and dry.  Bathe / shower every day.  Keep the area clean by showering / bathing over the incision / wound.   It is okay to soak an open wound to help wash it.  Consider using a squeeze bottle filled with warm water to gently wash the anal area.  Wet wipes or showers / gentle washing after bowel movements is often less traumatic than regular toilet paper. d. Dennis Bast will often notice bleeding with bowel movements.  This should slow down by the end of the first week of surgery.  Sitting on an ice pack can help. e. Expect some drainage.  This should slow down by the end of the first week of surgery, but you will have occasional bleeding or drainage up to a few months after surgery.  Wear an absorbent pad or soft cotton gauze in your underwear until the drainage stops.  6. ACTIVITIES as tolerated:   a. You may resume regular (light) daily activities beginning the next day--such as daily self-care, walking, climbing  stairs--gradually increasing activities as tolerated.  If you can walk 30 minutes without difficulty, it is safe to try more intense activity such as jogging, treadmill, bicycling, low-impact aerobics, swimming, etc. b. Save the most intensive and strenuous activity for last such as sit-ups, heavy lifting, contact sports, etc  Refrain from any heavy lifting or straining until you are off narcotics for pain control.   c. DO NOT PUSH THROUGH PAIN.  Let pain be your guide: If it hurts to do something, don't do it.  Pain is your body warning you to avoid that activity for another week until the pain goes down. d. You may drive when you are no longer taking prescription pain medication, you can comfortably sit for long periods of time, and you can safely maneuver your car and apply brakes. e. Dennis Bast may have sexual intercourse when it is comfortable.  7. FOLLOW UP in our office a. Please call [facility] at [phone] to set up an appointment to see your surgeon in the office for a follow-up appointment approximately 2-3 weeks after your surgery. b. Make sure that you call for this appointment the day you arrive home to ensure a convenient appointment time.  8. IF YOU HAVE DISABILITY OR FAMILY LEAVE FORMS, BRING THEM TO THE OFFICE FOR PROCESSING.  DO NOT GIVE THEM TO YOUR DOCTOR.        WHEN TO CALL us [phone]: 1. Poor pain control 2. Reactions / problems with new medications (rash/itching, nausea, etc)  3. Fever over 101.5 F (38.5 C) 4. Inability to urinate 5. Nausea and/or vomiting 6. Worsening swelling or bruising 7. Continued bleeding from incision. 8. Increased pain, redness, or drainage from the incision  The clinic staff is available to answer your questions during regular business hours (8:30am-5pm).  Please dont hesitate to call and ask to speak to one of our nurses for clinical concerns.   A surgeon from [facility] is always on call at the hospitals   If you have a  medical emergency, go to the nearest emergency room or call 911.    [facility], [place], [place], [place], [place]  [address] ? MAIN: [phone] ? TOLL FREE: [phone] ? FAX [phone] [email or web address]   HEMORRHOIDS  The rectum is the last foot of your colon, and it naturally stretches to hold stool.  Hemorrhoidal piles are natural clusters of blood vessels that help the rectum and anal canal stretch to hold stool and allow bowel movements to eliminate feces.   Hemorrhoids are abnormally swollen blood vessels in the rectum.  Too much pressure in the rectum causes hemorrhoids by forcing blood to stretch and bulge the walls of the veins, sometimes even rupturing them.  Hemorrhoids can become like varicose veins you might see on a person's legs.  Most people will develop a flare of hemorrhoids in their lifetime.  When bulging hemorrhoidal veins are irritated, they can swell, burn, itch, cause pain, and bleed.  Most flares will calm down gradually own within a few weeks.  However, once hemorrhoids are created, they are difficult to get rid of completely and tend to flare more easily than the first flare.   Fortunately, good habits and simple medical treatment usually control hemorrhoids well, and surgery is needed only in severe cases. Types of Hemorrhoids:  Internal hemorrhoids usually don't initially hurt or itch; they are deep inside the rectum and usually have no sensation. If they begin to push out (prolapse), pain and burning can occur.  However, internal hemorrhoids can bleed.  Anal bleeding should not be ignored since bleeding could come from a dangerous source like colorectal cancer, so persistent rectal bleeding should be investigated by a doctor, sometimes with a colonoscopy.  External hemorrhoids cause most of the symptoms - pain, burning, and itching. Nonirritated hemorrhoids can look like small skin tags coming out of the anus.     Thrombosed hemorrhoids can form when a hemorrhoid blood vessel bursts and causes the hemorrhoid to suddenly swell.  A purple blood clot can form in it and become an excruciatingly painful lump at the anus. Because of these unpleasant symptoms, immediate incision and drainage by a surgeon at an office visit can provide much relief of the pain.    PREVENTION Avoiding the most frequent causes listed below will prevent most cases of hemorrhoids: Constipation Hard stools Diarrhea  Constant sitting  Straining with bowel movements Sitting  on the toilet for a long time  Severe coughing  episodes Pregnancy / Childbirth  Heavy Lifting  Sometimes avoiding the above triggers is difficult:  How can you avoid sitting all day if you have a seated job? Also, we try to avoid coughing and diarrhea, but sometimes its beyond your control.  Still, there are some practical hints to help: Keep the anal and genital area clean.  Moistened tissues such as flushable wet wipes are less irritating than toilet paper.  Using irrigating showers or bottle irrigation washing gently cleans this sensitive area.   Avoid dry toilet paper when cleaning after bowel movements.  [name] Keep the anal and genital area dry.  Lightly pat the rectal area dry.  Avoid rubbing.  Talcum or baby powders can help GET YOUR STOOLS SOFT.   This is the most important way to prevent irritated hemorrhoids.  Hard stools are like sandpaper to the anorectal canal and will cause more problems.  The goal: ONE SOFT BOWEL MOVEMENT A DAY!  BMs from every other day to 3 times a day is a tolerable range Treat coughing, diarrhea and constipation early since irritated hemorrhoids may soon follow.  If your main job activity is seated, always stand or walk during your breaks. Make it a point to stand and walk at least 5 minutes every hour and try to shift frequently in your chair to avoid direct rectal pressure.  Always exhale as you strain or lift. Don't hold your  breath.  Do not delay or try to prevent a bowel movement when the urge is present. Exercise regularly (walking or jogging 60 minutes a day) to stimulate the bowels to move. No reading or other activity while on the toilet. If bowel movements take longer than 5 minutes, you are too constipated. AVOID CONSTIPATION Drink plenty of liquids (1 1/2 to 2 quarts of water and other fluids a day unless fluid restricted for another medical condition). Liquids that contain caffeine (coffee a, tea, soft drinks) can be dehydrating and should be avoided until constipation is controlled. Consider minimizing milk, as dairy products may be constipating. Eat plenty of fiber (30g a day ideal, more if needed).  Fiber is the undigested part of plant food that passes into the colon, acting as natures broom to encourage bowel motility and movement.  Fiber can absorb and hold large amounts of water. This results in a larger, bulkier stool, which is soft and easier to pass.  Eating foods high in fiber - 12 servings - such as  Vegetables: Root (potatoes, carrots, turnips), Leafy green (lettuce, salad greens, celery, spinach), High residue (cabbage, broccoli, etc.) Fruit: Fresh, Dried (prunes, apricots, cherries), Stewed (applesauce)  Whole grain breads, pasta, whole wheat Bran cereals, muffins, etc. Consider adding supplemental bulking fiber which retains large volumes of water: Psyllium ground seeds (native plant from [place])--available as Metamucil, Konsyl, Effersyllium, Per Diem Fiber, or the less expensive generic forms.  Citrucel  (methylcellulose wood fiber) . FiberCon (Polycarbophil) Polyethylene Glycol - and artificial fiber commonly called Miralax or Glycolax.  It is helpful for people with gassy or bloated feelings with regular fiber Flax Seed - a less gassy natural fiber  Laxatives can be useful for a short period if constipation is severe Osmotics (Milk of Magnesia, Fleets Phospho-Soda, Magnesium  Citrate)  Stimulants (Senokot,   Castor Oil,  Dulcolax, Ex-Lax)    Laxatives are not a good long-term solution as it can stress the bowels and cause too much mineral loss and dehydration.  Avoid taking laxatives for more than 7 days in a row.  AVOID DIARRHEA Switch to liquids and simpler foods for a few days to avoid stressing your intestines further. Avoid dairy products (especially milk & ice cream) for a short time.  The intestines often can lose the ability to digest lactose when stressed. Avoid foods that cause gassiness or bloating.  Typical foods include beans and other legumes, cabbage, broccoli, and dairy foods.  Every person has some sensitivity to other foods, so listen to your body and avoid those foods that trigger problems for you. Adding fiber (Citrucel, Metamucil, FiberCon, Flax seed, Miralax) gradually can help thicken stools by absorbing excess fluid and retrain the intestines to act more normally.  Slowly increase the dose over a few weeks.  Too much fiber too soon can backfire and cause cramping & bloating. Probiotics (such as active yogurt, Align, etc) may help repopulate the intestines and colon with normal bacteria and calm down a sensitive digestive tract.  Most studies show it to be of mild help, though, and such products can be costly. Medicines: Bismuth subsalicylate (ex. Kayopectate, Pepto Bismol) every 30 minutes for up to 6 doses can help control diarrhea.  Avoid if pregnant. Loperamide (Immodium) can slow down diarrhea.  Start with two tablets (4mg  total) first and then try one tablet every 6 hours.  Avoid if you are having fevers or severe pain.  If you are not better or start feeling worse, stop all medicines and call your doctor for advice Call your doctor if you are getting worse or not better.  Sometimes further testing (cultures, endoscopy, X-ray studies, bloodwork, etc) may be needed to help diagnose and treat the cause of the diarrhea.  TROUBLESHOOTING IRREGULAR  BOWELS 1) Avoid extremes of bowel movements (no bad constipation/diarrhea) 2) Miralax 17gm mixed in 8oz. water or juice-daily. May use BID as needed.  3) Gas-x,Phazyme, etc. as needed for gas & bloating.  4) Soft,bland diet. No spicy,greasy,fried foods.  5) Prilosec over-the-counter as needed  6) May hold gluten/wheat products from diet to see if symptoms improve.  7)  May try probiotics (Align, Activa, etc) to help calm the bowels down 7) If symptoms become worse call back immediately.   TREATMENT OF HEMORRHOID FLARE If these preventive measures fail, you must take action right away! Hemorrhoids are one condition that can be mild in the morning and become intolerable by nightfall. Most hemorrhoidal flares take several weeks to calm down.  These suggestions can help: Warm soaks.  This helps more than any topical medication.  Use up to 8 times a day.  Usually sitz baths or sitting in a warm bathtub helps.  Sitting on moist warm towels are helpful.  Switching to ice packs/cool compresses can be helpful  Use a Sitz Bath 4-8 times a day for relief A sitz bath is a warm water bath taken in the sitting position that covers only the hips and buttocks. It may be used for either healing or hygiene purposes. Sitz baths are also used to relieve pain, itching, or muscle spasms. The water may contain medicine. Moist heat will help you heal and relax.  HOME CARE INSTRUCTIONS  Take 3 to 4 sitz baths a day. 6. Fill the bathtub half full with warm water. 7. Sit in the water and open the drain a little. 8. Turn on the warm water to keep the tub half full. Keep the water running constantly. 9. Soak in the water for 15 to 20 minutes.  10. After the sitz bath, pat the affected area dry first. SEEK MEDICAL CARE IF:  You get worse instead of better. Stop the sitz baths if you get worse.  Normalize your bowels.  Extremes of diarrhea or constipation will make hemorrhoids worse.  One soft bowel movement a day is  the goal.  Fiber can help get your bowels regular Wet wipes instead of toilet paper Pain control with a NSAID such as ibuprofen (Advil) or naproxen (Aleve) or acetaminophen (Tylenol) around the clock.  Narcotics are constipating and should be minimized if possible Topical creams contain steroids (bydrocortisone) or local anesthetic (xylocaine) can help make pain and itching more tolerable.   EVALUATION If hemorrhoids are still causing problems, you could benefit by an evaluation by a surgeon.  The surgeon will obtain a history and examine you.  If hemorrhoids are diagnosed, some therapies can be offered in the office, usually with an anoscope into the less sensitive area of the rectum: -injection of hemorrhoids (sclerotherapy) can scar the blood vessels of the swollen/enlarged hemorrhoids to help shrink them down to a more normal size -rubber banding of the enlarged hemorrhoids to help shrink them down to a more normal size -drainage of the blood clot causing a thrombosed hemorrhoid,  to relieve the severe pain   While 90% of the time such problems from hemorrhoids can be managed without preceding to surgery, sometimes the hemorrhoids require a operation to control the problem (uncontrolled bleeding, prolapse, pain, etc.).   This involves being placed under general anesthesia where the surgeon can confirm the diagnosis and remove, suture, or staple the hemorrhoid(s).  Your surgeon can help you treat the problem appropriately.

## 2017-07-01 NOTE — Op Note (Signed)
07/01/2017  11:31 AM  PATIENT:  Hunter Boone  72 y.o. male  Patient Care Team: Binnie Rail, MD as PCP - General (Internal Medicine) Michael Boston, MD as Consulting Physician (General Surgery) Pyrtle, Lajuan Lines, MD as Consulting Physician (Gastroenterology)  PRE-OPERATIVE DIAGNOSIS:  HEMORRHOIDS PROLAPSING GRADE 3 WITH BLEEDING WITH PAIN  POST-OPERATIVE DIAGNOSIS:  HEMORRHOIDS PROLAPSING GRADE 3 WITH BLEEDING WITH PAIN  PROCEDURE:     Internal and external hemorrhoidectomy  x2 Internal hemorrhoidal ligation and pexy Anorectal examination under anesthesia  SURGEON:  [name], MD  ANESTHESIA:   General Anorectal & Local field block  0.25% bupivacaine with epinephrine at the beginning of the case. Liposomal bupivacaine (Experel) at the end of the case.  EBL:  Total I/O In: -  Out: 50 [Blood:50].  See operative record  Delay start of Pharmacological VTE agent (>24hrs) due to surgical blood loss or risk of bleeding:  NO  DRAINS: NONE  SPECIMEN:   Internal & external hemorrhoidx2  DISPOSITION OF SPECIMEN:  PATHOLOGY  COUNTS:  YES  PLAN OF CARE: Discharge home after PACU  PATIENT DISPOSITION:  PACU - hemodynamically stable.  INDICATION: Pleasant patient with struggles with hemorrhoids.  Not able to be managed in the office despite an improved bowel regimen.  I recommended examination under anesthesia and surgical treatment:  The anatomy & physiology of the anorectal region was discussed.  The pathophysiology of hemorrhoids and differential diagnosis was discussed.  Natural history risks without surgery was discussed.   I stressed the importance of a bowel regimen to have daily soft bowel movements to minimize progression of disease.  Interventions such as sclerotherapy & banding were discussed.  The patient's symptoms are not adequately controlled by medicines and other non-operative treatments.  I feel the risks & problems of no surgery outweigh the operative  risks; therefore, I recommended surgery to treat the hemorrhoids by ligation, pexy, and possible resection.  Risks such as bleeding, infection, need for further treatment, heart attack, death, and other risks were discussed.   I noted a good likelihood this will help address the problem.  Goals of post-operative recovery were discussed as well.  Possibility that this will not correct all symptoms was explained.  Post-operative pain, bleeding, constipation, urinary difficulties, and other problems after surgery were discussed.  We will work to minimize complications.   Educational handouts further explaining the pathology, treatment options, and bowel regimen were given as well.  Questions were answered.  The patient expresses understanding & wishes to proceed with surgery.  OR FINDINGS: Large left lateral grade 4 hemorrhoid with external component.  Significant right posterior external hemorrhoid as well.  Grade 2 right posterior and right anterior hemorrhoids.  No fissure.  No fistula no abscess no plan all disease.  No full rectal prolapse.  DESCRIPTION:   Informed consent was confirmed. Patient underwent general anesthesia without difficulty. Patient was placed into prone positioning.  The perianal region was prepped and draped in sterile fashion. Surgical time-out confirmed our plan.  I did digital rectal examination and then transitioned over to anoscopy to get a sense of the anatomy.  Findings noted above.   I proceeded to do hemorrhoidal ligation and pexy.  I used a 2-0 Vicryl suture on a UR-6 needle in a figure-of-eight fashion 6 cm proximal to the anal verge.  I started at the largest hemorrhoid pile prolapsing on the left lateral aspect.  Because of redundant hemorrhoidal tissue too bulky to merely ligate or pexy, I excised  the excess internal hemorrhoid piles longitudinally in a fusiform biconcave fashion, at the  left lateral location, sparing the anal canal to avoid narrowing.  I then ran  that stitch longitudinally more distally to close the hemorrhoidectomy wound to the anal verge over a Parks self retaining retractor & occasionally a large Hill-Furgeson retractor to avoid narrowing of the anal canal.  I then tied that stitch down to cause a hemorrhoidopexy.   I then did hemorrhoidal ligation and pexy at the other 5 columns.  At the completion of this, all 6 anorectal columns were ligated and pexied in the classic hexagonal fashion (right anterior/lateral/posterior, left anterior/lateral/posterior).  I closed the external part of the hemorrhoidectomy wounds with interrupted horizontal mattress 2-0 chromic suture, leaving the last 5 mm open to allow natural drainage.    I redid anoscopy & examination.  At completion of this, all hemorrhoids had been removed or reduced into the rectum.  There is no more prolapse.  Internal was more more normal.  However, he still had some redundant external hemorrhoidal tissue primarily right posterior aspect.  I excised longitudinally the right posterior hemorrhoidal pile.  Closed that with horizontal mattress interrupted sutures of 2-0 Vicryl and then 2-0 Prolene more externally.  Again did this over a large Hill-Ferguson retractor to avoid narrowing.  I did reinspection.  Hemostasis was good.  Fluffed gauze was on-laid over the perianal region.  No packing done.  Patient is being extubated go to go to the recovery room.  I had discussed postop care in detail with the patient in the preop holding area.  Instructions for post-operative recovery and prescriptions are written. I discussed operative findings, updated the patient's status, discussed probable steps to recovery, and gave postoperative recommendations to the patient's spouse.  Recommendations were made.  Questions were answered.  She expressed understanding & appreciation.  [name], M.D., F.A.C.S. Gastrointestinal and Minimally Invasive Surgery [facility] [address] [place], [place] [address] [phone] Main / Paging

## 2017-07-01 NOTE — Interval H&P Note (Signed)
History and Physical Interval Note:  07/01/2017 9:46 AM  Hunter Boone  has presented today for surgery, with the diagnosis of HEMORRHOIDS PROLAPSING GRADE 3 WITH BLEEDING WITH PAIN  The various methods of treatment have been discussed with the patient and family. After consideration of risks, benefits and other options for treatment, the patient has consented to  Procedure(s) with comments: ANORECTAL EXAMINATION UNDER ANESTHESIA, HEMORRHOIDECTOMY, HEMORRHOIDAL LIGATION/PEXY (N/A) - GENERAL AND LOCAL as a surgical intervention .  The patient's history has been reviewed, patient examined, no change in status, stable for surgery.  I have reviewed the patient's chart and labs.  Questions were answered to the patient's satisfaction.     [name]

## 2017-07-01 NOTE — Anesthesia Postprocedure Evaluation (Signed)
Anesthesia Post Note  Patient: Hunter Boone  Procedure(s) Performed: ANORECTAL EXAMINATION UNDER ANESTHESIA, HEMORRHOIDECTOMY, HEMORRHOIDAL LIGATION/PEXY (N/A )     Patient location during evaluation: PACU Anesthesia Type: General Level of consciousness: awake Pain management: pain level controlled Vital Signs Assessment: post-procedure vital signs reviewed and stable Respiratory status: spontaneous breathing Cardiovascular status: stable Anesthetic complications: no    Last Vitals:  Vitals:   07/01/17 0755 07/01/17 1145  BP: (!) 155/83 (!) 160/77  Pulse: 68 94  Resp: 16   Temp: 36.6 C 36.6 C  SpO2: 98% 100%    Last Pain:  Vitals:   07/01/17 1145  TempSrc:   PainSc: 0-No pain                 Annasofia Pohl

## 2017-07-01 NOTE — Anesthesia Procedure Notes (Signed)
Procedure Name: Intubation Date/Time: 07/01/2017 10:28 AM Performed by: Dione Booze, CRNA Pre-anesthesia Checklist: Suction available, Patient being monitored, Emergency Drugs available and Patient identified Patient Re-evaluated:Patient Re-evaluated prior to induction Oxygen Delivery Method: Circle system utilized Preoxygenation: Pre-oxygenation with 100% oxygen Induction Type: IV induction Ventilation: Mask ventilation without difficulty Laryngoscope Size: Mac and 4 Grade View: Grade I Tube type: Oral Number of attempts: 1 Airway Equipment and Method: Stylet Placement Confirmation: ETT inserted through vocal cords under direct vision,  positive ETCO2 and breath sounds checked- equal and bilateral Secured at: 23 cm Tube secured with: Tape Dental Injury: Teeth and Oropharynx as per pre-operative assessment

## 2017-07-01 NOTE — H&P (Signed)
Hunter Boone 07/01/2017  DOB: [birth year] Married / Language: [name] / Race: White Male  Patient Care Team: Binnie Rail, MD as PCP - General (Internal Medicine)   Patient sent for surgical consultation at the request of Dr. Zenovia Jarred, [facility]  Chief Complaint: Persistent hemorrhoidal problems despite banding 4  The patient is a 72 year old 55 male that has had hemorrhoid issues especially the past few years. Started getting some bleeding. Underwent banding by his gastroenterologist. 3 bandings completed by February 2018. Then had recurrence. Re-banded again this summer. Still with recurrent symptoms. Felt by GI that he had failed banding therapy. Surgical consultation requested. Smokes about 1 or 2 pipes a day. No prior anorectal or abdominal surgery. Main issue is blood with bowel movements and irritation. Occasionally gets some soiling. Occasional itching. He had some partial relief with the banding's but keeps coming back. Usually moves his bowels twice a day. Soft and regular. He focuses on a high-fiber diet. No heart or lung issues  No personal nor family history of GI/colon cancer, inflammatory bowel disease, irritable bowel syndrome, allergy such as Celiac Sprue, dietary/dairy problems, colitis, ulcers nor gastritis. No recent sick contacts/gastroenteritis. No travel outside the country. No changes in diet. No dysphagia to solids or liquids. No significant heartburn or reflux. No hematochezia, hematemesis, coffee ground emesis. No evidence of prior gastric/peptic ulceration.  Ready for surgery.  No new events  (Review of systems as stated in this history (HPI) or in the review of systems. Otherwise all other 12 point ROS are negative)   Allergies [name], LPN; 30/16/0109 32:35 AM) Allergies Reconciled  Medication History [name], LPN; 57/32/2025 42:70 AM) Aspirin (81MG  Tablet, Oral daily)  Active. Losartan Potassium (100MG  Tablet, Oral) Active. Lutein (Oral) Specific strength unknown - Active. Multiple Vitamin (Oral daily) Active. Fish Oil + D3 (1200-1000MG -UNIT Capsule, Oral daily) Active. Saw Palmetto (Serenoa repens) (450MG  Capsule, Oral daily) Active. Medications Reconciled    Vitals Claiborne Billings [facility] LPN; 62/37/6283 15:17 AM) 04/26/2017 10:29 AM Weight: 131 lb Height: 72in Body Surface Area: 1.78 m Body Mass Index: 17.77 kg/m  Temp.: 97.34F(Oral)  Pulse: 50 (Regular)  BP: 138/80 (Sitting, Right Arm, Standard)  BP (!) 155/83   Pulse 68   Temp 97.8 F (36.6 C) (Oral)   Resp 16   Ht 6' (1.829 m)   Wt 58.1 kg (128 lb)   SpO2 98%   BMI 17.36 kg/m      Physical Exam [name] MD; 04/26/2017 10:52 AM)  General Mental Status-Alert. General Appearance-Not in acute distress, Not Sickly. Orientation-Oriented X3. Hydration-Well hydrated. Voice-Normal.  Integumentary Global Assessment Upon inspection and palpation of skin surfaces of the - Axillae: non-tender, no inflammation or ulceration, no drainage. and Distribution of scalp and body hair is normal. General Characteristics Temperature - normal warmth is noted.  Head and Neck Head-normocephalic, atraumatic with no lesions or palpable masses. Face Global Assessment - atraumatic, no absence of expression. Neck Global Assessment - no abnormal movements, no bruit auscultated on the right, no bruit auscultated on the left, no decreased range of motion, non-tender. Trachea-midline. Thyroid Gland Characteristics - non-tender.  Eye Eyeball - Left-Extraocular movements intact, No Nystagmus. Eyeball - Right-Extraocular movements intact, No Nystagmus. Cornea - Left-No Hazy. Cornea - Right-No Hazy. Sclera/Conjunctiva - Left-No scleral icterus, No Discharge. Sclera/Conjunctiva - Right-No scleral icterus, No Discharge. Pupil - Left-Direct  reaction to light normal. Pupil - Right-Direct reaction to light normal. Note: Wears glasses. Vision corrected  ENMT Ears Pinna - Left -  no drainage observed, no generalized tenderness observed. Right - no drainage observed, no generalized tenderness observed. Nose and Sinuses External Inspection of the Nose - no destructive lesion observed. Inspection of the nares - Left - quiet respiration. Right - quiet respiration. Mouth and Throat Lips - Upper Lip - no fissures observed, no pallor noted. Lower Lip - no fissures observed, no pallor noted. Nasopharynx - no discharge present. Oral Cavity/Oropharynx - Tongue - no dryness observed. Oral Mucosa - no cyanosis observed. Hypopharynx - no evidence of airway distress observed.  Chest and Lung Exam Inspection Movements - Normal and Symmetrical. Accessory muscles - No use of accessory muscles in breathing. Palpation Palpation of the chest reveals - Non-tender. Auscultation Breath sounds - Normal and Clear.  Cardiovascular Auscultation Rhythm - Regular. Murmurs & Other Heart Sounds - Auscultation of the heart reveals - No Murmurs and No Systolic Clicks.  Abdomen Inspection Inspection of the abdomen reveals - No Visible peristalsis and No Abnormal pulsations. Umbilicus - No Bleeding, No Urine drainage. Palpation/Percussion Palpation and Percussion of the abdomen reveal - Soft, Non Tender, No Rebound tenderness, No Rigidity (guarding) and No Cutaneous hyperesthesia. Note: Abdomen soft. Nontender. Not distended. No umbilical or incisional hernias. No guarding.  Male Genitourinary Sexual Maturity Tanner 5 - Adult hair pattern and Adult penile size and shape. Note: No inguinal hernias. Normal external genitalia. Epididymi, testes, and spermatic cords normal without any masses.  Rectal Note: Please refer to anoscopy section. Enlarged hemorrhoids especially right posterior and left lateral with external left lateral  hemorrhoid  Peripheral Vascular Upper Extremity Inspection - Left - No Cyanotic nailbeds, Not Ischemic. Right - No Cyanotic nailbeds, Not Ischemic.  Neurologic Neurologic evaluation reveals -normal attention span and ability to concentrate, able to name objects and repeat phrases. Appropriate fund of knowledge , normal sensation and normal coordination. Mental Status Affect - not angry, not paranoid. Cranial Nerves-Normal Bilaterally. Gait-Normal.  Neuropsychiatric Mental status exam performed with findings of-able to articulate well with normal speech/language, rate, volume and coherence, thought content normal with ability to perform basic computations and apply abstract reasoning and no evidence of hallucinations, delusions, obsessions or homicidal/suicidal ideation.  Musculoskeletal Global Assessment Spine, Ribs and Pelvis - no instability, subluxation or laxity. Right Upper Extremity - no instability, subluxation or laxity.  Lymphatic Head & Neck  General Head & Neck Lymphatics: Bilateral - Description - No Localized lymphadenopathy. Axillary  General Axillary Region: Bilateral - Description - No Localized lymphadenopathy. Femoral & Inguinal  Generalized Femoral & Inguinal Lymphatics: Left - Description - No Localized lymphadenopathy. Right - Description - No Localized lymphadenopathy.   Results [name] MD; 04/26/2017 11:09 AM) Procedures  Name Value Date Hemorrhoids Procedure Anal exam: External Hemorrhoid prolapse Internal exam: Internal Hemorroids ( non-bleeding) Other: External hemorrhoidal tags especially left lateral. Left lateral internal hemorrhoid grade 3 partially prolapsing. Inflamed grade 2 right posterior hemorrhoid. More scarred down right anterior. Normal sphincter tone. Perianal skin clean with good hygiene. No pruritis ani. No pilonidal disease. No fissure. No abscess/fistula. Normal sphincter tone. No  condyloma warts. Tolerates digital and anoscopic rectal exam. Prostate mildly enlarged but smooth. No rectal masses. Definite scarring right greater than left anal canal consistent with prior bandings.  Performed: 04/26/2017 10:53 AM    Assessment & Plan   PROLAPSED INTERNAL HEMORRHOIDS, GRADE 3 (K64.2) Impression: Irritated internal hemorrhoids despite banding 4. Left lateral partially prolapsing.  I agree he's failed nonoperative management. Think he would benefit from operative hemorrhoidal ligation and pexy. Hemorrhoidectomy of  remaining abnormal tissues. At least the left lateral pile. Probably the right posterior as well. Outpatient surgery.  The anatomy & physiology of the anorectal region was discussed. The pathophysiology of hemorrhoids and differential diagnosis was discussed. Natural history progression was discussed. I stressed the importance of a bowel regimen to have daily soft bowel movements to minimize progression of disease. Goal of one BM / day ideal. Use of wet wipes, warm baths, avoiding straining, etc were emphasized.  Educational handouts further explaining the pathology, treatment options, and bowel regimen were given as well. The patient expressed understanding.   [name], M.D., F.A.C.S. Gastrointestinal and Minimally Invasive Surgery [facility] [address], [place], [place] [address] [phone] Main / Paging

## 2017-07-01 NOTE — Progress Notes (Addendum)
Pt is "forgetful" per wife. Pt was interviewed for pre-op by himself.  Pt initially stated that he had coffee, water, and eras shake this morning at 0630.  Wife clarified that he did not have coffee. She clarified that he had the eras shake and pt reiterated that he had water this morning "minimal amount."   Pt last solid food was yesterday at 1300 and switched over to clear liquids at that time. Pt wife states that bowel prep was performed (milk of magnesia, fleet enema and clear liquids). Dr. Nyoka Cowden notified that pt had water this morning.

## 2017-07-28 NOTE — Progress Notes (Signed)
Subjective:    Patient ID: Hunter Boone, male    DOB: 02/17/1946, 72 y.o.   MRN: [id number]  HPI The patient is here for an acute visit.  He is here with his wife.  Hypertension: He is taking his medication daily as prescribed.  His blood pressure is usually well controlled at home and he did take it recently and it was 130/72.  07/01/17 he had an anorectal examination under anesthesia, hemorrhoidectomy, hemorrhoidal ligation.  He has had a long history of hemorrhoids that failed banding x4 in the past he was experiencing intermittent bleeding.  Swelling in ankles: He started having bilateral ankle and lower leg swelling about 1 month ago; after the above procedure.  He denies a previous history of swelling in the ankles.  He denies any chest pain, shortness of breath and palpitations.  He has not had any abdominal discomfort.  He has had some urinary symptoms including frequency, incontinence only at nighttime and possibly urinating smaller amounts when he does go.  He denies changes in his medications or diet-denies any increased sodium intake and generally eats a very low sodium diet.  Urinary incontinence:  The urinary incontinence started after the hemorroidectomy.  He denies any changes in his medications or diet.  In addition to the nighttime incontinence he has had urinary frequency.  Looking back over the past several months he does admit that he seems like he is going more often, but there is less urine.  He does have a history of BPH and has never been on medication.  He does take saw palmetto.      Medications and allergies reviewed with patient and updated if appropriate.  Patient Active Problem List   Diagnosis Date Noted  . Mouth ulcer 05/12/2017  . Prediabetes 02/01/2017  . COPD (chronic obstructive pulmonary disease) with emphysema ([place]) 06/13/2016  . Fall 05/25/2016  . Fracture of multiple ribs of left side 05/25/2016  . Lung nodule 05/25/2016  . Traumatic  pneumothorax 05/24/2016  . Pityriasis rosea 08/22/2015  . Nonspecific abnormal electrocardiogram (ECG) (EKG) 10/25/2012  . 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 Prolapsed internal hemorrhoids, grade 3, s/p hemorrhoidal ligation/pexy/excision 07/01/2017 04/10/2009  . External hemorrhoids status post external hemorrhoidectomy 07/01/2017  04/10/2009  . DIVERTICULOSIS-COLON 04/10/2009  . PERSONAL HX COLONIC POLYPS 04/10/2009  . PIPE SMOKER 01/26/2008  . Essential hypertension 01/26/2008    Current Outpatient Medications on File Prior to Visit  Medication Sig Dispense Refill  . aspirin 81 MG tablet Take 81 mg by mouth daily.      . fluticasone (FLONASE) 50 MCG/ACT nasal spray Place 2 sprays into both nostrils daily. 48 g 3  . hydrocortisone (ANUSOL-HC) 2.5 % rectal cream Place 1 application rectally 2 (two) times daily. Use 7-14 days (Patient taking differently: Place 1 application rectally 2 (two) times daily as needed for hemorrhoids or anal itching. ) 30 g 0  . hydrocortisone-pramoxine (ANALPRAM-HC) 2.5-1 % rectal cream Place 1 application rectally 4 (four) times daily. For irritated and painful hemorrhoids 15 g 2  . losartan (COZAAR) 100 MG tablet Take 1 tablet (100 mg total) by mouth daily. 90 tablet 2  . LUTEIN PO Take 1 capsule by mouth daily.     . Multiple Vitamins-Minerals (CENTRUM PO) Take 1 tablet by mouth daily.     . Multiple Vitamins-Minerals (PRESERVISION AREDS 2 PO) Take 1 capsule by mouth every morning.     . Omega-3 Fatty Acids (FISH  OIL) 1200 MG CAPS Take 1,200 mg by mouth daily.     [name] oxyCODONE (OXY IR/ROXICODONE) 5 MG immediate release tablet Take 1-2 tablets (5-10 mg total) by mouth every 4 (four) hours as needed for moderate pain, severe pain or breakthrough pain. 40 tablet 0  . Saw Palmetto 450 MG CAPS Take 450 mg by mouth daily.     [name] triamcinolone (KENALOG) 0.1 % paste Use as directed 1 application in the mouth or throat 2 (two) times daily. 5 g 12   . vitamin C (ASCORBIC ACID) 500 MG tablet Take 500 mg by mouth daily.     No current facility-administered medications on file prior to visit.     Past Medical History:  Diagnosis Date  . Hypertension   . Internal hemorrhoids   . Macular degeneration   . Tubular adenoma of colon     Past Surgical History:  Procedure Laterality Date  . adenomatous polyps  2009, 2011,2014    Dr Fuller Plan;   . COLONOSCOPY    . EVALUATION UNDER ANESTHESIA WITH HEMORRHOIDECTOMY N/A 07/01/2017   Procedure: ANORECTAL EXAMINATION UNDER ANESTHESIA, HEMORRHOIDECTOMY, HEMORRHOIDAL LIGATION/PEXY;  Surgeon: Michael Boston, MD;  Location: [facility];  Service: General;  Laterality: N/A;  GENERAL AND LOCAL  . POLYPECTOMY      Social History   Socioeconomic History  . Marital status: Married    Spouse name: None  . Number of children: None  . Years of education: None  . Highest education level: None  Social Needs  . Financial resource strain: None  . Food insecurity - worry: None  . Food insecurity - inability: None  . Transportation needs - medical: None  . Transportation needs - non-medical: None  Occupational History  . Occupation: retired  Tobacco Use  . Smoking status: Current Some Day Smoker    Packs/day: 0.50    Types: Pipe  . Smokeless tobacco: Never Used  . Tobacco comment: Presently 2 bowls / day; smoked cigarettes age [age] , up to 1 ppd  Substance and Sexual Activity  . Alcohol use: Yes    Alcohol/week: 4.2 oz    Types: 7 Shots of liquor per week    Comment: drink daily   . Drug use: No  . Sexual activity: None  Other Topics Concern  . None  Social History Narrative  . None    Family History  Problem Relation Age of Onset  . Heart attack Father        early 30s  . Diabetes Father   . Diabetes Mother   . Liver cancer Maternal Aunt   . Stroke Neg Hx   . COPD Neg Hx   . Colon cancer Neg Hx   . Esophageal cancer Neg Hx   . Rectal cancer Neg Hx   . Stomach cancer Neg Hx   .  Asthma Neg Hx     Review of Systems  Constitutional: Negative for chills and fever.  Respiratory: Negative for cough, shortness of breath and wheezing.   Cardiovascular: Positive for leg swelling. Negative for chest pain and palpitations.  Gastrointestinal: Negative for abdominal pain, blood in stool, constipation, diarrhea and nausea.  Genitourinary: Positive for frequency. Negative for difficulty urinating, dysuria and hematuria.       Incontinence at night only  Neurological: Negative for light-headedness and headaches.       Objective:   Vitals:   07/29/17 1422  BP: (!) 152/86  Pulse: 71  Resp: 16  Temp: 98.2 F (36.8 C)  SpO2: 95%   BP Readings from Last 3 Encounters:  07/29/17 (!) 152/86  07/01/17 (!) 158/79  06/29/17 (!) 166/84     Wt Readings from Last 3 Encounters:  07/29/17 135 lb (61.2 kg)  07/01/17 128 lb (58.1 kg)  06/29/17 128 lb (58.1 kg)   Body mass index is 18.31 kg/m.   Physical Exam  Constitutional: He is oriented to person, place, and time. He appears well-developed and well-nourished. No distress.  HENT:  Head: Normocephalic and atraumatic.  Eyes: Conjunctivae are normal.  Neck: Neck supple. No tracheal deviation present. No thyromegaly present.  Cardiovascular: Normal rate, regular rhythm and normal heart sounds.  No murmur heard. Pulmonary/Chest: Effort normal and breath sounds normal. No respiratory distress. He has no wheezes. He has no rales.  Abdominal: Soft. He exhibits no distension and no mass (He does seem to be a firmness in his lower abdomen-?  Bladder.  He denies any discomfort with palpation). There is no tenderness.  Genitourinary:  Genitourinary Comments: Deferred prostate exam given recent hemorrhoidectomy  Musculoskeletal: He exhibits edema (2+ bilateral lower extremity edema, pitting).  Lymphadenopathy:    He has no cervical adenopathy.  Neurological: He is alert and oriented to person, place, and time.  Skin: Skin is  warm and dry. He is not diaphoretic. No erythema.  Psychiatric: He has a normal mood and affect. His behavior is normal.           Assessment & Plan:    See Problem List for Assessment and Plan of chronic medical problems.

## 2017-07-29 ENCOUNTER — Encounter: Payer: Self-pay | Admitting: Internal Medicine

## 2017-07-29 ENCOUNTER — Ambulatory Visit (INDEPENDENT_AMBULATORY_CARE_PROVIDER_SITE_OTHER): Payer: Medicare Other | Admitting: Internal Medicine

## 2017-07-29 ENCOUNTER — Other Ambulatory Visit (INDEPENDENT_AMBULATORY_CARE_PROVIDER_SITE_OTHER): Payer: Medicare Other

## 2017-07-29 VITALS — BP 152/86 | HR 71 | Temp 98.2°F | Resp 16 | Wt 135.0 lb

## 2017-07-29 DIAGNOSIS — R350 Frequency of micturition: Secondary | ICD-10-CM

## 2017-07-29 DIAGNOSIS — N3944 Nocturnal enuresis: Secondary | ICD-10-CM | POA: Insufficient documentation

## 2017-07-29 DIAGNOSIS — R600 Localized edema: Secondary | ICD-10-CM

## 2017-07-29 LAB — CBC WITH DIFFERENTIAL/PLATELET
BASOS ABS: 0.1 10*3/uL (ref 0.0–0.1)
Basophils Relative: 1.1 % (ref 0.0–3.0)
EOS ABS: 0.2 10*3/uL (ref 0.0–0.7)
Eosinophils Relative: 1.6 % (ref 0.0–5.0)
HEMATOCRIT: 34.6 % — AB (ref 39.0–52.0)
Hemoglobin: 11.8 g/dL — ABNORMAL LOW (ref 13.0–17.0)
LYMPHS PCT: 17.7 % (ref 12.0–46.0)
Lymphs Abs: 1.7 10*3/uL (ref 0.7–4.0)
MCHC: 34.1 g/dL (ref 30.0–36.0)
MCV: 94.3 fl (ref 78.0–100.0)
MONO ABS: 1 10*3/uL (ref 0.1–1.0)
Monocytes Relative: 10.5 % (ref 3.0–12.0)
Neutro Abs: 6.6 10*3/uL (ref 1.4–7.7)
Neutrophils Relative %: 69.1 % (ref 43.0–77.0)
PLATELETS: 435 10*3/uL — AB (ref 150.0–400.0)
RBC: 3.67 Mil/uL — ABNORMAL LOW (ref 4.22–5.81)
RDW: 13.4 % (ref 11.5–15.5)
WBC: 9.6 10*3/uL (ref 4.0–10.5)

## 2017-07-29 LAB — TSH: TSH: 4.34 u[IU]/mL (ref 0.35–4.50)

## 2017-07-29 LAB — COMPREHENSIVE METABOLIC PANEL
ALT: 17 U/L (ref 0–53)
AST: 21 U/L (ref 0–37)
Albumin: 3.5 g/dL (ref 3.5–5.2)
Alkaline Phosphatase: 70 U/L (ref 39–117)
BILIRUBIN TOTAL: 0.4 mg/dL (ref 0.2–1.2)
BUN: 30 mg/dL — AB (ref 6–23)
CALCIUM: 9.1 mg/dL (ref 8.4–10.5)
CO2: 27 mEq/L (ref 19–32)
CREATININE: 1.44 mg/dL (ref 0.40–1.50)
Chloride: 98 mEq/L (ref 96–112)
GFR: 51.25 mL/min — ABNORMAL LOW (ref >60.00)
GLUCOSE: 96 mg/dL (ref 70–99)
Potassium: 4.1 mEq/L (ref 3.5–5.1)
SODIUM: 132 meq/L — AB (ref 135–145)
Total Protein: 7.3 g/dL (ref 6.0–8.3)

## 2017-07-29 LAB — URINALYSIS, ROUTINE W REFLEX MICROSCOPIC
BILIRUBIN URINE: NEGATIVE
Ketones, ur: NEGATIVE
Nitrite: NEGATIVE
Specific Gravity, Urine: 1.015 (ref 1.000–1.030)
Total Protein, Urine: NEGATIVE
UROBILINOGEN UA: 0.2 (ref 0.0–1.0)
Urine Glucose: NEGATIVE
pH: 5.5 (ref 5.0–8.0)

## 2017-07-29 LAB — PSA, MEDICARE: PSA: 10.16 ng/mL — AB (ref 0.10–4.00)

## 2017-07-29 MED ORDER — FUROSEMIDE 20 MG PO TABS: 20.0000 mg | ORAL_TABLET | Freq: Every day | ORAL | 3 refills | Status: DC

## 2017-07-29 MED ORDER — TAMSULOSIN HCL 0.4 MG PO CAPS: 0.4000 mg | ORAL_CAPSULE | Freq: Every day | ORAL | 3 refills | Status: DC

## 2017-07-29 NOTE — Assessment & Plan Note (Signed)
Possibly related to BPH and overflow incontinence Start Flomax We will check PSA, urinalysis, urine culture Deferred prostate exam given recent hemorrhoidectomy May need to see urology We will check routine blood work including CBC, CMP as well He will update me as to his symptoms in a few days we can adjust treatment

## 2017-07-29 NOTE — Patient Instructions (Addendum)
  Test(s) ordered today. Your results will be released to [facility] (or called to you) after review, usually within 72hours after test completion. If any changes need to be made, you will be notified at that same time.   Medications reviewed and updated.  Changes include starting furosemide 20 mg daily (water pill) in the morning and flomax daily ( for the prostate).    Your prescription(s) have been submitted to your pharmacy. Please take as directed and contact our office if you believe you are having problem(s) with the medication(s).

## 2017-07-29 NOTE — Assessment & Plan Note (Signed)
New over the past month No evidence of heart failure.  No history of kidney disease or liver disease with previous blood work-last blood work 1 month ago No change in medications or diet and no history of venous insufficiency Symptoms started after minor surgery-hemorrhoidectomy with general anesthesia Concern for possible acute kidney injury related to urinary retention from anesthesia/BPH Check blood work including CBC, CMP, TSH Start Lasix 20 mg daily-we will titrate after results and see how he does after a few days Elevate legs when sitting

## 2017-07-29 NOTE — Assessment & Plan Note (Signed)
Expensing frequent urination, smaller amounts of urine which is acute on chronic Also experiencing nighttime incontinence-this only occurs at nighttime Urinalysis, urine culture to rule out UTI Possibly side effect of general anesthesia in addition to his BPH May be experiencing some overflow incontinence His bladder seems larger and full on exam Flomax daily, Lasix 20 mg daily We will adjust treatment depending on how he does over the next few days If he experiences worsening of urinary retention he may need to go to the emergency room may need catheterization

## 2017-07-30 ENCOUNTER — Other Ambulatory Visit: Payer: Self-pay | Admitting: Internal Medicine

## 2017-07-30 ENCOUNTER — Encounter: Payer: Self-pay | Admitting: Internal Medicine

## 2017-07-30 LAB — URINE CULTURE
MICRO NUMBER:: 90205375
SPECIMEN QUALITY:: ADEQUATE

## 2017-07-30 MED ORDER — CIPROFLOXACIN HCL 500 MG PO TABS
500.0000 mg | ORAL_TABLET | Freq: Two times a day (BID) | ORAL | 0 refills | Status: DC
Start: 2017-07-30 — End: 2017-08-16

## 2017-08-01 ENCOUNTER — Telehealth: Payer: Self-pay | Admitting: Internal Medicine

## 2017-08-01 NOTE — Telephone Encounter (Signed)
Spoke with pts wife to give lab results. Advised as discussed, results were sent via MyChart over the weekend along with antibiotic. She is to call back in a few days if symptoms are not any better.

## 2017-08-01 NOTE — Telephone Encounter (Signed)
Copied from [place] [id number]. Topic: General - Other >> Aug 01, 2017 11:51 AM Yvette Rack wrote: Reason for CRM: pt wife calling for lab results of urine and blood test

## 2017-08-02 ENCOUNTER — Telehealth: Payer: Self-pay | Admitting: Internal Medicine

## 2017-08-02 ENCOUNTER — Other Ambulatory Visit: Payer: Self-pay | Admitting: *Deleted

## 2017-08-02 DIAGNOSIS — I10 Essential (primary) hypertension: Secondary | ICD-10-CM

## 2017-08-02 MED ORDER — LOSARTAN POTASSIUM 100 MG PO TABS: 100.0000 mg | ORAL_TABLET | Freq: Every day | ORAL | 2 refills | Status: DC

## 2017-08-02 NOTE — Telephone Encounter (Signed)
Rx routed to new pharmacy per patient request.

## 2017-08-02 NOTE — Telephone Encounter (Signed)
Copied from [place]. Topic: Quick Communication - Rx Refill/Question >> Aug 02, 2017 11:14 AM Arletha Grippe wrote: Medication:  losartan (COZAAR) 100 MG tablet  Has the patient contacted their pharmacy? Yes.     (Agent: If no, request that the patient contact the pharmacy for the refill.)   Preferred Pharmacy (with phone number or street name): [facility]  - pt can get $0 copay for 90 days supply, insurance changed for this year    Agent: Please be advised that RX refills may take up to 3 business days. We ask that you follow-up with your pharmacy.

## 2017-08-13 ENCOUNTER — Telehealth: Payer: Self-pay | Admitting: Internal Medicine

## 2017-08-13 NOTE — Telephone Encounter (Signed)
I would like him to follow up with me in the next couple of week so  - we can repeat his blood work at that time.

## 2017-08-15 NOTE — Telephone Encounter (Signed)
Appt made

## 2017-08-15 NOTE — Telephone Encounter (Signed)
LVM for pt to call back and schedule appt. 

## 2017-08-15 NOTE — Progress Notes (Signed)
Subjective:    Patient ID: Hunter Boone, male    DOB: 04-20-1946, 72 y.o.   MRN: [id number]  HPI The patient is here for follow up.   Labs, refer to urolo  Hypertension: He is taking his medication daily. He is compliant with a low sodium diet.     Leg edema:  Three weeks ago I started him on lasix 20 mg daily.  He is taking medication daily as prescribed.  The feet and ankle swelling is worse.  He denies any shortness of breath.  He continues to have urinary symptoms.  Urinary incontinence, urinary frequency, h/o BPH, elevated PSA:  He started flomax, cipro 3 weeks ago.  He has completed the antibiotic.  He has frequent uriantion.  He does still experience some incontinence.  After urinating he will experience dripping for a minute or so.  He does have lower abdominal pain at times.  He does have pain or discomfort in the penis sometimes when he urinates.      Medications and allergies reviewed with patient and updated if appropriate.  Patient Active Problem List   Diagnosis Date Noted  . Bilateral leg edema 07/29/2017  . Frequent urination 07/29/2017  . Nocturnal enuresis 07/29/2017  . Mouth ulcer 05/12/2017  . Prediabetes 02/01/2017  . COPD (chronic obstructive pulmonary disease) with emphysema ([place]) 06/13/2016  . Fall 05/25/2016  . Fracture of multiple ribs of left side 05/25/2016  . Lung nodule 05/25/2016  . Traumatic pneumothorax 05/24/2016  . Pityriasis rosea 08/22/2015  . Nonspecific abnormal electrocardiogram (ECG) (EKG) 10/25/2012  . 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 Prolapsed internal hemorrhoids, grade 3, s/p hemorrhoidal ligation/pexy/excision 07/01/2017 04/10/2009  . External hemorrhoids status post external hemorrhoidectomy 07/01/2017  04/10/2009  . DIVERTICULOSIS-COLON 04/10/2009  . PERSONAL HX COLONIC POLYPS 04/10/2009  . PIPE SMOKER 01/26/2008  . Essential hypertension 01/26/2008    Current Outpatient Medications on File Prior  to Visit  Medication Sig Dispense Refill  . aspirin 81 MG tablet Take 81 mg by mouth daily.      . fluticasone (FLONASE) 50 MCG/ACT nasal spray Place 2 sprays into both nostrils daily. 48 g 3  . furosemide (LASIX) 20 MG tablet Take 1 tablet (20 mg total) by mouth daily. 30 tablet 3  . hydrocortisone (ANUSOL-HC) 2.5 % rectal cream Place 1 application rectally 2 (two) times daily. Use 7-14 days (Patient taking differently: Place 1 application rectally 2 (two) times daily as needed for hemorrhoids or anal itching. ) 30 g 0  . hydrocortisone-pramoxine (ANALPRAM-HC) 2.5-1 % rectal cream Place 1 application rectally 4 (four) times daily. For irritated and painful hemorrhoids 15 g 2  . losartan (COZAAR) 100 MG tablet Take 1 tablet (100 mg total) by mouth daily. 90 tablet 2  . LUTEIN PO Take 1 capsule by mouth daily.     . Multiple Vitamins-Minerals (CENTRUM PO) Take 1 tablet by mouth daily.     . Multiple Vitamins-Minerals (PRESERVISION AREDS 2 PO) Take 1 capsule by mouth every morning.     . Omega-3 Fatty Acids (FISH OIL) 1200 MG CAPS Take 1,200 mg by mouth daily.     [name] oxyCODONE (OXY IR/ROXICODONE) 5 MG immediate release tablet Take 1-2 tablets (5-10 mg total) by mouth every 4 (four) hours as needed for moderate pain, severe pain or breakthrough pain. 40 tablet 0  . Saw Palmetto 450 MG CAPS Take 450 mg by mouth daily.     . tamsulosin (FLOMAX)  0.4 MG CAPS capsule Take 1 capsule (0.4 mg total) by mouth daily. 30 capsule 3  . triamcinolone (KENALOG) 0.1 % paste Use as directed 1 application in the mouth or throat 2 (two) times daily. 5 g 12  . vitamin C (ASCORBIC ACID) 500 MG tablet Take 500 mg by mouth daily.     No current facility-administered medications on file prior to visit.     Past Medical History:  Diagnosis Date  . Hypertension   . Internal hemorrhoids   . Macular degeneration   . Tubular adenoma of colon     Past Surgical History:  Procedure Laterality Date  . adenomatous polyps   2009, 2011,2014    Dr Fuller Plan;   . COLONOSCOPY    . EVALUATION UNDER ANESTHESIA WITH HEMORRHOIDECTOMY N/A 07/01/2017   Procedure: ANORECTAL EXAMINATION UNDER ANESTHESIA, HEMORRHOIDECTOMY, HEMORRHOIDAL LIGATION/PEXY;  Surgeon: Michael Boston, MD;  Location: [facility];  Service: General;  Laterality: N/A;  GENERAL AND LOCAL  . POLYPECTOMY      Social History   Socioeconomic History  . Marital status: Married    Spouse name: None  . Number of children: None  . Years of education: None  . Highest education level: None  Social Needs  . Financial resource strain: None  . Food insecurity - worry: None  . Food insecurity - inability: None  . Transportation needs - medical: None  . Transportation needs - non-medical: None  Occupational History  . Occupation: retired  Tobacco Use  . Smoking status: Current Some Day Smoker    Packs/day: 0.50    Types: Pipe  . Smokeless tobacco: Never Used  . Tobacco comment: Presently 2 bowls / day; smoked cigarettes age [age] , up to 1 ppd  Substance and Sexual Activity  . Alcohol use: Yes    Alcohol/week: 4.2 oz    Types: 7 Shots of liquor per week    Comment: drink daily   . Drug use: No  . Sexual activity: None  Other Topics Concern  . None  Social History Narrative  . None    Family History  Problem Relation Age of Onset  . Heart attack Father        early 31s  . Diabetes Father   . Diabetes Mother   . Liver cancer Maternal Aunt   . Stroke Neg Hx   . COPD Neg Hx   . Colon cancer Neg Hx   . Esophageal cancer Neg Hx   . Rectal cancer Neg Hx   . Stomach cancer Neg Hx   . Asthma Neg Hx     Review of Systems  Constitutional: Negative for chills and fever.  Respiratory: Negative for shortness of breath.   Cardiovascular: Positive for leg swelling. Negative for chest pain and palpitations.  Gastrointestinal: Positive for abdominal pain (sometimes lower abdominal discomfort).  Genitourinary: Positive for difficulty urinating and frequency.  Negative for hematuria.       Objective:   Vitals:   08/16/17 1554  BP: 138/82  Pulse: 72  Resp: 16  Temp: 98.1 F (36.7 C)  SpO2: 91%   Wt Readings from Last 3 Encounters:  08/16/17 129 lb (58.5 kg)  07/29/17 135 lb (61.2 kg)  07/01/17 128 lb (58.1 kg)   Body mass index is 17.5 kg/m.   Physical Exam    Constitutional: Appears well-developed and well-nourished. No distress.  HENT:  Head: Normocephalic and atraumatic.  Cardiovascular: Normal rate, regular rhythm and normal heart sounds.   No murmur heard.  2+ pitting edema Pulmonary/Chest: Effort normal and breath sounds normal. No respiratory distress. No has no wheezes. No rales.  Abdomen: Palpable bladder with minimal tenderness, remainder abdomen soft and nontender GU: Prostate exam deferred-we will urgently refer to urology Skin: Skin is warm and dry. Not diaphoretic.      Assessment & Plan:    See Problem List for Assessment and Plan of chronic medical problems.

## 2017-08-16 ENCOUNTER — Ambulatory Visit (INDEPENDENT_AMBULATORY_CARE_PROVIDER_SITE_OTHER): Payer: Medicare Other | Admitting: Internal Medicine

## 2017-08-16 ENCOUNTER — Other Ambulatory Visit (INDEPENDENT_AMBULATORY_CARE_PROVIDER_SITE_OTHER): Payer: Medicare Other

## 2017-08-16 ENCOUNTER — Encounter: Payer: Self-pay | Admitting: Internal Medicine

## 2017-08-16 VITALS — BP 138/82 | HR 72 | Temp 98.1°F | Resp 16 | Wt 129.0 lb

## 2017-08-16 DIAGNOSIS — R972 Elevated prostate specific antigen [PSA]: Secondary | ICD-10-CM | POA: Diagnosis not present

## 2017-08-16 DIAGNOSIS — R339 Retention of urine, unspecified: Secondary | ICD-10-CM | POA: Insufficient documentation

## 2017-08-16 DIAGNOSIS — R600 Localized edema: Secondary | ICD-10-CM | POA: Diagnosis not present

## 2017-08-16 DIAGNOSIS — I10 Essential (primary) hypertension: Secondary | ICD-10-CM

## 2017-08-16 LAB — CBC WITH DIFFERENTIAL/PLATELET
BASOS PCT: 1.2 % (ref 0.0–3.0)
Basophils Absolute: 0.1 10*3/uL (ref 0.0–0.1)
EOS PCT: 5.1 % — AB (ref 0.0–5.0)
Eosinophils Absolute: 0.5 10*3/uL (ref 0.0–0.7)
HCT: 36.3 % — ABNORMAL LOW (ref 39.0–52.0)
Hemoglobin: 12.4 g/dL — ABNORMAL LOW (ref 13.0–17.0)
LYMPHS ABS: 2.6 10*3/uL (ref 0.7–4.0)
Lymphocytes Relative: 28.7 % (ref 12.0–46.0)
MCHC: 34.1 g/dL (ref 30.0–36.0)
MCV: 93.5 fl (ref 78.0–100.0)
MONO ABS: 0.9 10*3/uL (ref 0.1–1.0)
Monocytes Relative: 9.4 % (ref 3.0–12.0)
Neutro Abs: 5.1 10*3/uL (ref 1.4–7.7)
Neutrophils Relative %: 55.6 % (ref 43.0–77.0)
Platelets: 418 10*3/uL — ABNORMAL HIGH (ref 150.0–400.0)
RBC: 3.88 Mil/uL — ABNORMAL LOW (ref 4.22–5.81)
RDW: 13.9 % (ref 11.5–15.5)
WBC: 9.2 10*3/uL (ref 4.0–10.5)

## 2017-08-16 LAB — COMPREHENSIVE METABOLIC PANEL
ALBUMIN: 3.9 g/dL (ref 3.5–5.2)
ALK PHOS: 69 U/L (ref 39–117)
ALT: 17 U/L (ref 0–53)
AST: 24 U/L (ref 0–37)
BILIRUBIN TOTAL: 0.4 mg/dL (ref 0.2–1.2)
BUN: 19 mg/dL (ref 6–23)
CALCIUM: 9.7 mg/dL (ref 8.4–10.5)
CO2: 29 mEq/L (ref 19–32)
CREATININE: 0.93 mg/dL (ref 0.40–1.50)
Chloride: 104 mEq/L (ref 96–112)
GFR: 84.88 mL/min (ref >60.00)
Glucose, Bld: 84 mg/dL (ref 70–99)
Potassium: 4.2 mEq/L (ref 3.5–5.1)
Sodium: 140 mEq/L (ref 135–145)
TOTAL PROTEIN: 7.6 g/dL (ref 6.0–8.3)

## 2017-08-16 LAB — PSA, MEDICARE: PSA: 9.52 ng/mL — AB (ref 0.10–4.00)

## 2017-08-16 NOTE — Assessment & Plan Note (Signed)
Likely from decreased kidney function and bladder retention Continue Lasix 20 mg daily CMP

## 2017-08-16 NOTE — Assessment & Plan Note (Signed)
BP well controlled Current regimen effective and well tolerated Continue current medications at current doses  

## 2017-08-16 NOTE — Assessment & Plan Note (Signed)
Bladder retention likely related to BPH, possible prostatitis or prostate cancer Taking Flomax nightly and only minimal improvement Bladder distended and possible obstruction Urgent referral to urology  CMP, CBC, PSA

## 2017-08-16 NOTE — Assessment & Plan Note (Signed)
Took Cipro We will recheck PSA today Elevation possibly related to BPH, obstruction, prostatitis or possibly prostate cancer Urgent referral to urology

## 2017-08-16 NOTE — Patient Instructions (Addendum)
  Test(s) ordered today. Your results will be released to [place] (or called to you) after review, usually within 72hours after test completion. If any changes need to be made, you will be notified at that same time.   Medications reviewed and updated.  No changes recommended at this time.  A referral was ordered urology.

## 2017-08-19 ENCOUNTER — Encounter: Payer: Self-pay | Admitting: Emergency Medicine

## 2017-08-23 DIAGNOSIS — R350 Frequency of micturition: Secondary | ICD-10-CM | POA: Diagnosis not present

## 2017-08-23 DIAGNOSIS — R972 Elevated prostate specific antigen [PSA]: Secondary | ICD-10-CM | POA: Diagnosis not present

## 2017-08-23 DIAGNOSIS — N401 Enlarged prostate with lower urinary tract symptoms: Secondary | ICD-10-CM | POA: Diagnosis not present

## 2017-09-15 DIAGNOSIS — D075 Carcinoma in situ of prostate: Secondary | ICD-10-CM | POA: Diagnosis not present

## 2017-09-15 DIAGNOSIS — R972 Elevated prostate specific antigen [PSA]: Secondary | ICD-10-CM | POA: Diagnosis not present

## 2017-09-21 DIAGNOSIS — N401 Enlarged prostate with lower urinary tract symptoms: Secondary | ICD-10-CM | POA: Diagnosis not present

## 2017-09-21 DIAGNOSIS — R972 Elevated prostate specific antigen [PSA]: Secondary | ICD-10-CM | POA: Diagnosis not present

## 2017-10-19 IMAGING — CT CT CHEST W/O CM
2 of 3 series · 15 of 36 positions shown, 18 images · non-contrast
Comparison: Chest radiograph, [DATE]

ADDENDUM:
Critical Value/emergent results were called by telephone at the time
of interpretation on 05/24/2016 at [DATE] to Dr. PAOLITO MULLISACA , who
verbally acknowledged these results.
CLINICAL DATA: Fell [REDACTED]. Landed on lt side. Sudden lt post ib
cage pain. Crepitus on exam. Eval for pneumothorax.

EXAM:
CT CHEST WITHOUT CONTRAST
TECHNIQUE: Multidetector CT imaging of the chest was performed following the
standard protocol without IV contrast.

[Series 2: thorax · axial · 0.74mm/px · z∈[-323,-17]mm · 12 of 181 slices shown, 15 images]
[im 14/181  mediastinal]
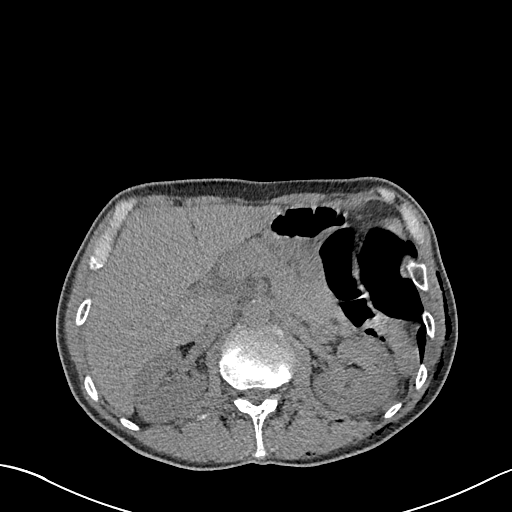
[im 14/181  lung]
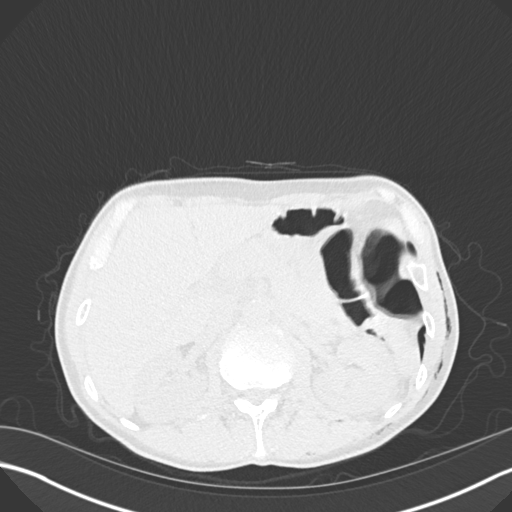
[im 27/181  lung]
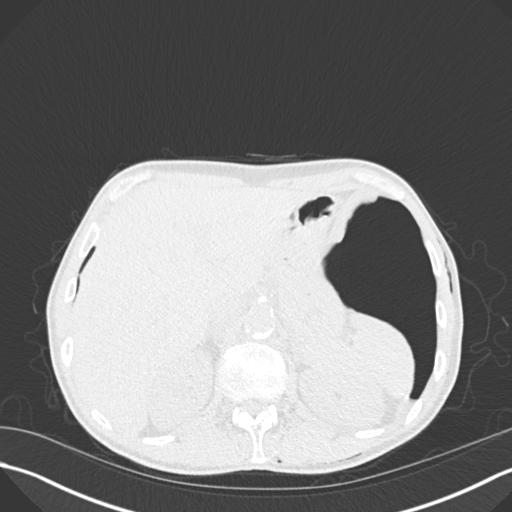
[im 41/181  lung]
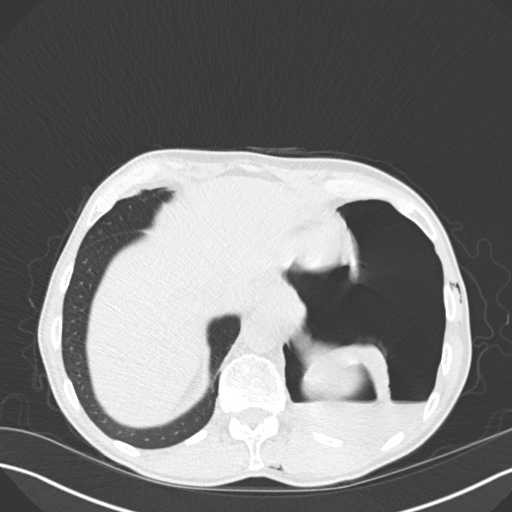
[im 54/181  lung]
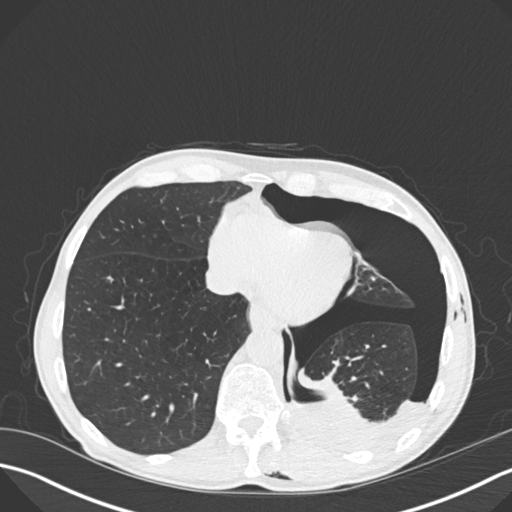
[im 67/181  mediastinal]
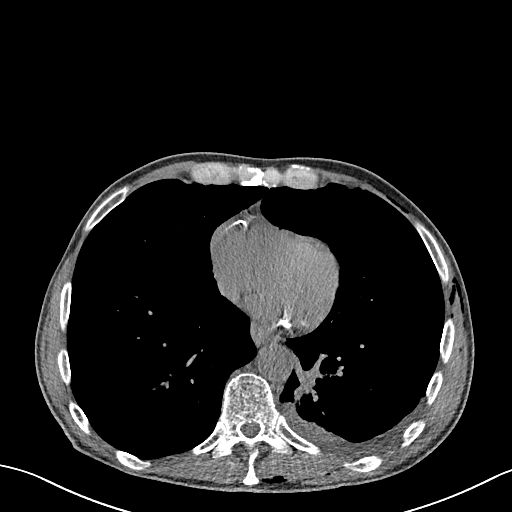
[im 67/181  lung]
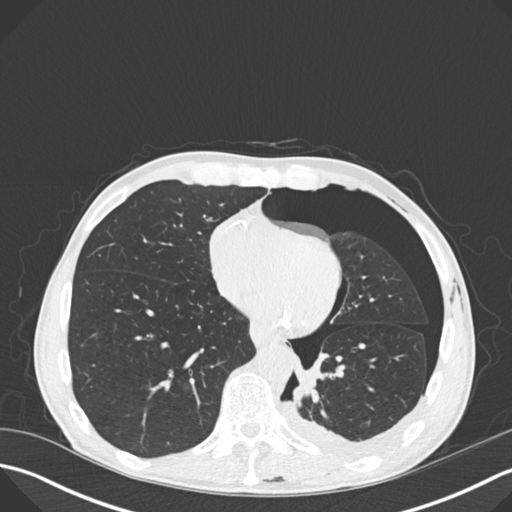
[im 81/181  lung]
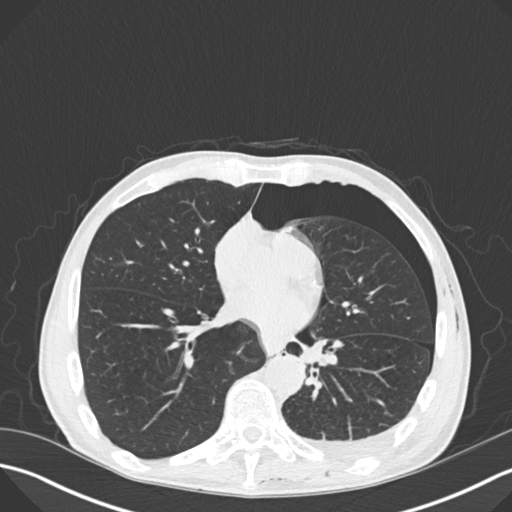
[im 101/181  lung]
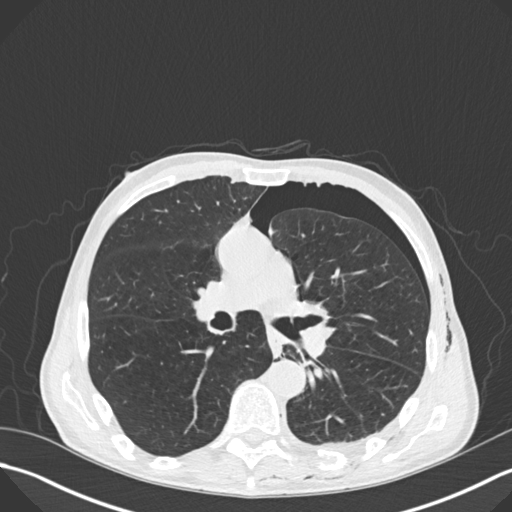
[im 114/181  lung]
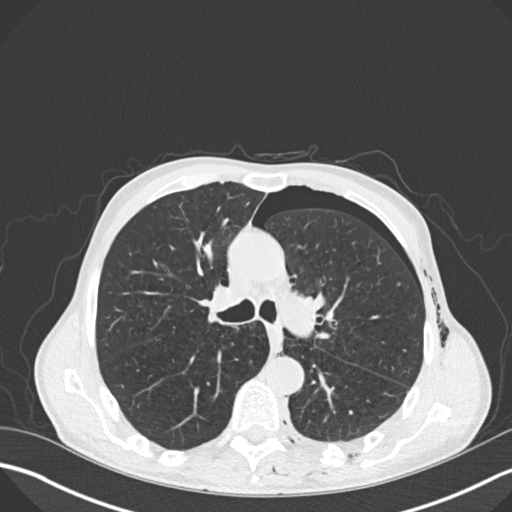
[im 127/181  mediastinal]
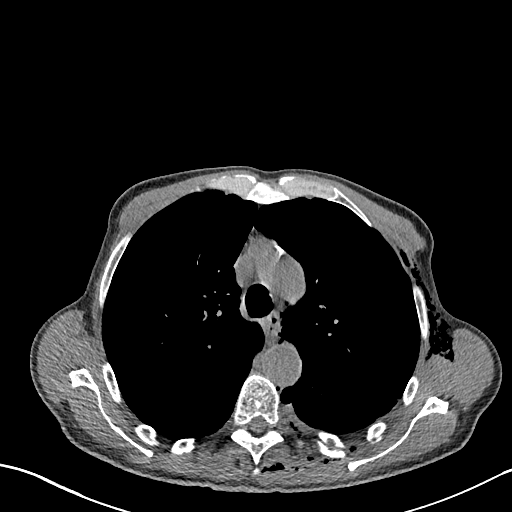
[im 127/181  lung]
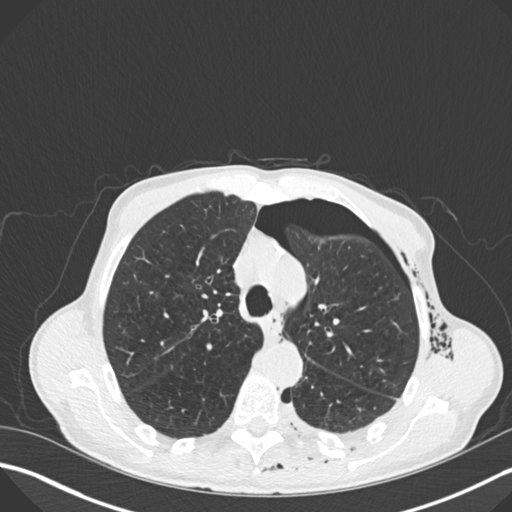
[im 141/181  lung]
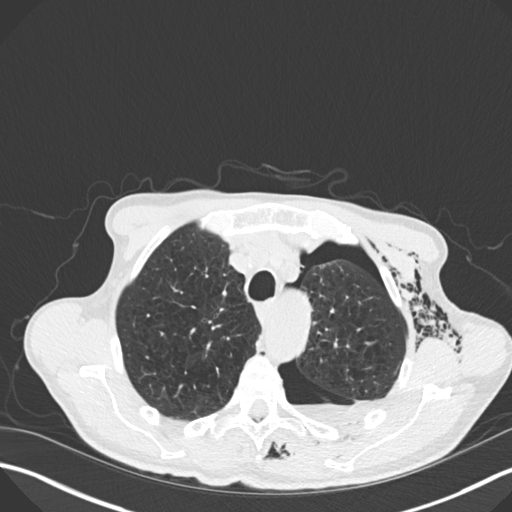
[im 154/181  lung]
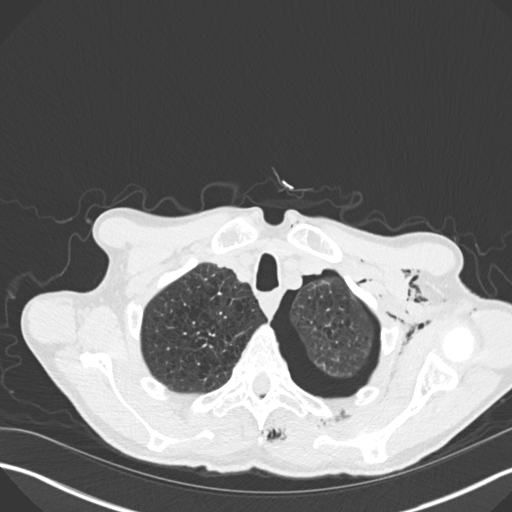
[im 167/181  lung]
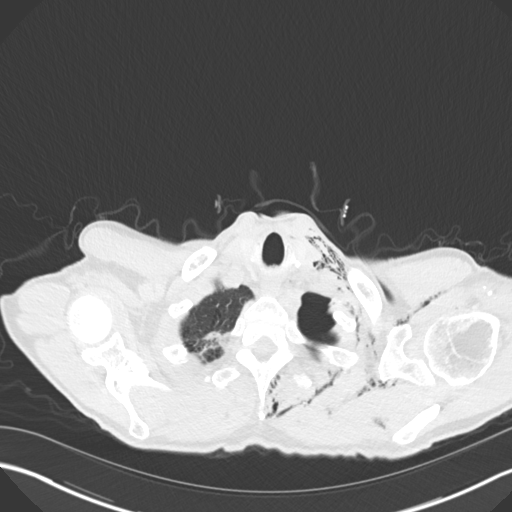

[Series 5: coronal · coronal · 0.61mm/px · 3 of 130 slices shown]
[im 26/130  lung]
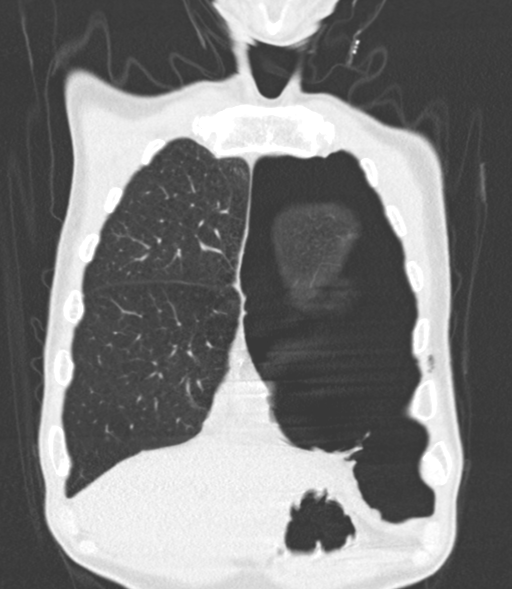
[im 52/130  lung]
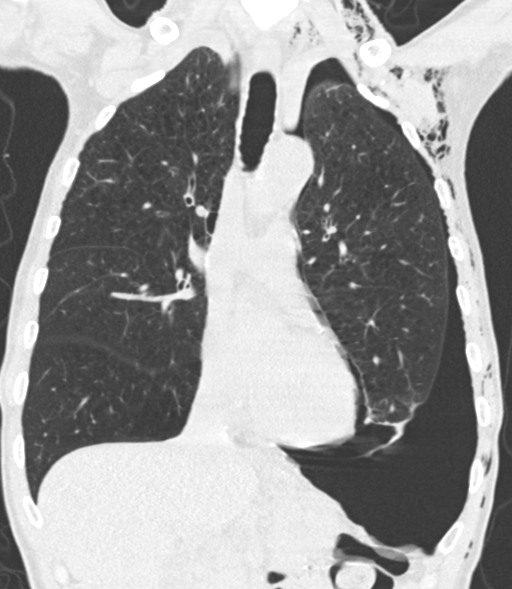
[im 78/130  lung]
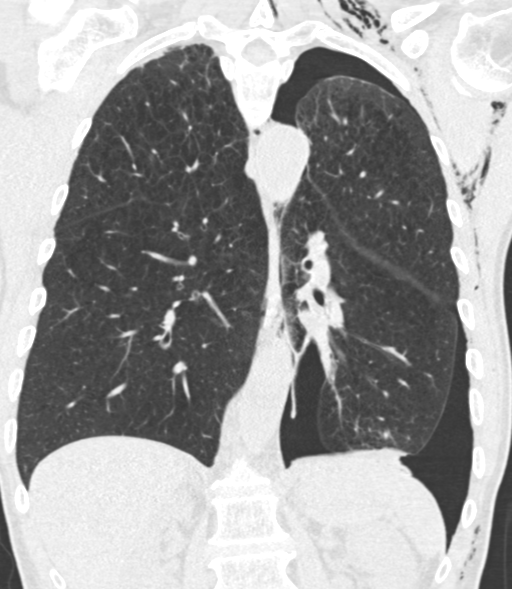

[15 of 36 positions shown; findings below may reference images not displayed]

FINDINGS: Cardiovascular: Heart is normal in size and configuration. There are
coronary artery calcifications. Ascending aorta is prominent
measuring 3.8 cm. Mild calcified atherosclerotic plaque noted along
the aortic arch and descending thoracic aorta.

Mediastinum/Nodes: No mediastinal or hilar masses. No pathologically
enlarged lymph nodes.

Lungs/Pleura: Patient has a moderate-sized left hydropneumothorax.
No right pleural effusion or pneumothorax. There are moderate
changes of emphysema. An area of focal opacity is noted in the right
upper lobe measuring 9 mm in size consistent with a ground-glass
nodule or, more likely, focus of scarring. No evidence of pneumonia
or pulmonary edema. There is some atelectasis in the partly
collapsed left lower lobe.LLungs are clear. No pleural effusion or
pneumothorax..

Upper Abdomen: No acute abnormality.

Musculoskeletal: There are multiple posterior left rib fractures.
Fractures extend from the posterior fifth rib two knee posterior
ninth rib. Fractures of the sixth through eighth ribs are comminuted
and minimally displaced. There is surrounding subcutaneous and
deeper soft tissue air that also extends into the epidural spinal
canal. There extends along the left anterolateral chest wall to the
left neck base. There is also trace of pneumomediastinum.
IMPRESSION: 1. Multiple left-sided posterior rib fractures with an associated
moderate-sized hydropneumothorax and significant subcutaneous and
deeper soft tissue emphysema, a small amount of air within the
epidural spinal canal and a trace amount of pneumomediastinum.
2. No other acute findings.
3. Moderate emphysema.
4. 9 mm irregular sub solid nodule in the right upper lobe centered
on image 36, series 3. Initial follow-up with CT at 6-12 months is
recommended to confirm persistence. If persistent, repeat CT is
recommended every 2 years until 5 years of stability has been
established. This recommendation follows the consensus statement:
Guidelines for Management of Incidental Pulmonary Nodules Detected

## 2017-12-19 ENCOUNTER — Other Ambulatory Visit: Payer: Self-pay | Admitting: Internal Medicine

## 2018-03-17 ENCOUNTER — Other Ambulatory Visit: Payer: Self-pay | Admitting: Internal Medicine

## 2018-03-29 ENCOUNTER — Other Ambulatory Visit: Payer: Self-pay | Admitting: Internal Medicine

## 2018-03-29 DIAGNOSIS — I10 Essential (primary) hypertension: Secondary | ICD-10-CM

## 2018-04-19 ENCOUNTER — Other Ambulatory Visit: Payer: Self-pay | Admitting: Internal Medicine

## 2018-04-19 NOTE — Progress Notes (Signed)
Subjective:    Patient ID: Hunter Boone, male    DOB: [birth year], 72 y.o.   MRN: [id number]  HPI The patient is here for follow up.  Hypertension: He is taking his medication daily. He is compliant with a low sodium diet.  He denies chest pain, palpitations, edema, shortness of breath and regular headaches. He is exercising regularly.      Prediabetes:  He is compliant with a low sugar/carbohydrate diet.  He is exercising regularly - walking, weights.  Leg edema:  He is taking lasix 20 mg daily.  He has been taking it every day and is unsure if he needs it anymore.  He denies any leg edema.  BPH w/ LUTS, elevated PSA:   He is following with urology.  He had a prostate biopsy that did not show cancer, but he had an abnormal biopsy and is at increased risk of cancer.  He is taking flomax daily.    Lung nodule:  Ct scan from 10/18 shows RUL nodule and annual ct scan recommended.  He has a cough sometimes, but is actively smoking.  Medications and allergies reviewed with patient and updated if appropriate.  Patient Active Problem List   Diagnosis Date Noted  . Bladder retention 08/16/2017  . Elevated PSA 08/16/2017  . Bilateral leg edema 07/29/2017  . Frequent urination 07/29/2017  . Nocturnal enuresis 07/29/2017  . Mouth ulcer 05/12/2017  . Prediabetes 02/01/2017  . COPD (chronic obstructive pulmonary disease) with emphysema ([place]) 06/13/2016  . Fall 05/25/2016  . Fracture of multiple ribs of left side 05/25/2016  . Lung nodule 05/25/2016  . Traumatic pneumothorax 05/24/2016  . Pityriasis rosea 08/22/2015  . Nonspecific abnormal electrocardiogram (ECG) (EKG) 10/25/2012  . 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 Prolapsed internal hemorrhoids, grade 3, s/p hemorrhoidal ligation/pexy/excision 07/01/2017 04/10/2009  . External hemorrhoids status post external hemorrhoidectomy 07/01/2017  04/10/2009  . DIVERTICULOSIS-COLON 04/10/2009  . PERSONAL HX COLONIC POLYPS 04/10/2009  .  PIPE SMOKER 01/26/2008  . Essential hypertension 01/26/2008    Current Outpatient Medications on File Prior to Visit  Medication Sig Dispense Refill  . aspirin 81 MG tablet Take 81 mg by mouth daily.      . fluticasone (FLONASE) 50 MCG/ACT nasal spray Place 2 sprays into both nostrils daily. 48 g 3  . furosemide (LASIX) 20 MG tablet TAKE 1 TABLET EACH DAY. 30 tablet 0  . hydrocortisone-pramoxine (ANALPRAM-HC) 2.5-1 % rectal cream Place 1 application rectally 4 (four) times daily. For irritated and painful hemorrhoids 15 g 2  . LUTEIN PO Take 1 capsule by mouth daily.     . Multiple Vitamins-Minerals (CENTRUM PO) Take 1 tablet by mouth daily.     . Multiple Vitamins-Minerals (PRESERVISION AREDS 2 PO) Take 1 capsule by mouth every morning.     . Omega-3 Fatty Acids (FISH OIL) 1200 MG CAPS Take 1,200 mg by mouth daily.     . Saw Palmetto 450 MG CAPS Take 450 mg by mouth daily.     . tamsulosin (FLOMAX) 0.4 MG CAPS capsule Take 1 capsule (0.4 mg total) by mouth daily. 30 capsule 3  . triamcinolone (KENALOG) 0.1 % paste Use as directed 1 application in the mouth or throat 2 (two) times daily. 5 g 12  . vitamin C (ASCORBIC ACID) 500 MG tablet Take 500 mg by mouth daily.     No current facility-administered medications on file prior to visit.     Past Medical History:  Diagnosis Date  .  Hypertension   . Internal hemorrhoids   . Macular degeneration   . Tubular adenoma of colon     Past Surgical History:  Procedure Laterality Date  . adenomatous polyps  2009, 2011,2014    Dr Fuller Plan;   . COLONOSCOPY    . EVALUATION UNDER ANESTHESIA WITH HEMORRHOIDECTOMY N/A 07/01/2017   Procedure: ANORECTAL EXAMINATION UNDER ANESTHESIA, HEMORRHOIDECTOMY, HEMORRHOIDAL LIGATION/PEXY;  Surgeon: Michael Boston, MD;  Location: [facility];  Service: General;  Laterality: N/A;  GENERAL AND LOCAL  . POLYPECTOMY      Social History   Socioeconomic History  . Marital status: Married    Spouse name: Not on file  .  Number of children: Not on file  . Years of education: Not on file  . Highest education level: Not on file  Occupational History  . Occupation: retired  Scientific laboratory technician  . Financial resource strain: Not on file  . Food insecurity:    Worry: Not on file    Inability: Not on file  . Transportation needs:    Medical: Not on file    Non-medical: Not on file  Tobacco Use  . Smoking status: Current Some Day Smoker    Packs/day: 0.50    Types: Pipe  . Smokeless tobacco: Never Used  . Tobacco comment: Presently 2 bowls / day; smoked cigarettes age [age] , up to 1 ppd  Substance and Sexual Activity  . Alcohol use: Yes    Alcohol/week: 7.0 standard drinks    Types: 7 Shots of liquor per week    Comment: drink daily   . Drug use: No  . Sexual activity: Not on file  Lifestyle  . Physical activity:    Days per week: Not on file    Minutes per session: Not on file  . Stress: Not on file  Relationships  . Social connections:    Talks on phone: Not on file    Gets together: Not on file    Attends religious service: Not on file    Active member of club or organization: Not on file    Attends meetings of clubs or organizations: Not on file    Relationship status: Not on file  Other Topics Concern  . Not on file  Social History Narrative  . Not on file    Family History  Problem Relation Age of Onset  . Heart attack Father        early 43s  . Diabetes Father   . Diabetes Mother   . Liver cancer Maternal Aunt   . Stroke Neg Hx   . COPD Neg Hx   . Colon cancer Neg Hx   . Esophageal cancer Neg Hx   . Rectal cancer Neg Hx   . Stomach cancer Neg Hx   . Asthma Neg Hx     Review of Systems  Constitutional: Negative for chills and fever.  Respiratory: Positive for cough (sometimes and phelgm). Negative for shortness of breath and wheezing.   Cardiovascular: Positive for leg swelling (controlled). Negative for chest pain and palpitations.  Neurological: Negative for light-headedness  and headaches.       Objective:   Vitals:   04/20/18 1032  BP: 138/80  Pulse: 74  Resp: 16  Temp: 98.4 F (36.9 C)  SpO2: 98%   BP Readings from Last 3 Encounters:  04/20/18 138/80  08/16/17 138/82  07/29/17 (!) 152/86   Wt Readings from Last 3 Encounters:  04/20/18 131 lb (59.4 kg)  08/16/17 129 lb (  58.5 kg)  07/29/17 135 lb (61.2 kg)   Body mass index is 17.77 kg/m.   Physical Exam    Constitutional: Appears well-developed and well-nourished. No distress.  HENT:  Head: Normocephalic and atraumatic.  Neck: Neck supple. No tracheal deviation present. No thyromegaly present.  No cervical lymphadenopathy Cardiovascular: Normal rate, regular rhythm and normal heart sounds.   No murmur heard. No carotid bruit .  No edema Pulmonary/Chest: Effort normal and breath sounds normal. No respiratory distress. No has no wheezes. No rales.  Skin: Skin is warm and dry. Not diaphoretic.  Psychiatric: Normal mood and affect. Behavior is normal.      Assessment & Plan:    See Problem List for Assessment and Plan of chronic medical problems.

## 2018-04-19 NOTE — Patient Instructions (Addendum)
A Ct scan was ordered to follow up your lung nodule.  Someone will call you to schedule.    Tests ordered today. Your results will be released to [place] (or called to you) after review, usually within 72hours after test completion. If any changes need to be made, you will be notified at that same time.  Medications reviewed and updated.  Changes include :   none  Your prescription(s) have been submitted to your pharmacy. Please take as directed and contact our office if you believe you are having problem(s) with the medication(s).   Please followup in 6 months

## 2018-04-20 ENCOUNTER — Encounter: Payer: Self-pay | Admitting: Internal Medicine

## 2018-04-20 ENCOUNTER — Other Ambulatory Visit (INDEPENDENT_AMBULATORY_CARE_PROVIDER_SITE_OTHER): Payer: Medicare Other

## 2018-04-20 ENCOUNTER — Ambulatory Visit (INDEPENDENT_AMBULATORY_CARE_PROVIDER_SITE_OTHER): Payer: Medicare Other | Admitting: Internal Medicine

## 2018-04-20 VITALS — BP 138/80 | HR 74 | Temp 98.4°F | Resp 16 | Ht 72.0 in | Wt 131.0 lb

## 2018-04-20 DIAGNOSIS — N138 Other obstructive and reflux uropathy: Secondary | ICD-10-CM

## 2018-04-20 DIAGNOSIS — R600 Localized edema: Secondary | ICD-10-CM

## 2018-04-20 DIAGNOSIS — F17290 Nicotine dependence, other tobacco product, uncomplicated: Secondary | ICD-10-CM | POA: Diagnosis not present

## 2018-04-20 DIAGNOSIS — Z720 Tobacco use: Secondary | ICD-10-CM | POA: Diagnosis not present

## 2018-04-20 DIAGNOSIS — R911 Solitary pulmonary nodule: Secondary | ICD-10-CM | POA: Diagnosis not present

## 2018-04-20 DIAGNOSIS — I10 Essential (primary) hypertension: Secondary | ICD-10-CM

## 2018-04-20 DIAGNOSIS — N401 Enlarged prostate with lower urinary tract symptoms: Secondary | ICD-10-CM

## 2018-04-20 DIAGNOSIS — R7303 Prediabetes: Secondary | ICD-10-CM

## 2018-04-20 LAB — CBC WITH DIFFERENTIAL/PLATELET
Basophils Absolute: 0.1 10*3/uL (ref 0.0–0.1)
Basophils Relative: 1 % (ref 0.0–3.0)
EOS ABS: 0.3 10*3/uL (ref 0.0–0.7)
Eosinophils Relative: 3 % (ref 0.0–5.0)
HCT: 42.7 % (ref 39.0–52.0)
Hemoglobin: 14.4 g/dL (ref 13.0–17.0)
Lymphocytes Relative: 25.6 % (ref 12.0–46.0)
Lymphs Abs: 2.6 10*3/uL (ref 0.7–4.0)
MCHC: 33.8 g/dL (ref 30.0–36.0)
MCV: 95.9 fl (ref 78.0–100.0)
Monocytes Absolute: 0.9 10*3/uL (ref 0.1–1.0)
Monocytes Relative: 9.3 % (ref 3.0–12.0)
NEUTROS ABS: 6.1 10*3/uL (ref 1.4–7.7)
Neutrophils Relative %: 61.1 % (ref 43.0–77.0)
PLATELETS: 276 10*3/uL (ref 150.0–400.0)
RBC: 4.45 Mil/uL (ref 4.22–5.81)
RDW: 13.7 % (ref 11.5–15.5)
WBC: 10 10*3/uL (ref 4.0–10.5)

## 2018-04-20 LAB — LIPID PANEL
Cholesterol: 187 mg/dL (ref 0–200)
HDL: 86.7 mg/dL (ref >39.00)
LDL CALC: 78 mg/dL (ref 0–99)
NONHDL: 100
TRIGLYCERIDES: 111 mg/dL (ref 0.0–149.0)
Total CHOL/HDL Ratio: 2
VLDL: 22.2 mg/dL (ref 0.0–40.0)

## 2018-04-20 LAB — COMPREHENSIVE METABOLIC PANEL
ALBUMIN: 4.4 g/dL (ref 3.5–5.2)
ALK PHOS: 50 U/L (ref 39–117)
ALT: 19 U/L (ref 0–53)
AST: 23 U/L (ref 0–37)
BILIRUBIN TOTAL: 0.8 mg/dL (ref 0.2–1.2)
BUN: 17 mg/dL (ref 6–23)
CO2: 27 mEq/L (ref 19–32)
Calcium: 9.6 mg/dL (ref 8.4–10.5)
Chloride: 101 mEq/L (ref 96–112)
Creatinine, Ser: 0.91 mg/dL (ref 0.40–1.50)
GFR: 86.87 mL/min (ref >60.00)
GLUCOSE: 101 mg/dL — AB (ref 70–99)
Potassium: 4 mEq/L (ref 3.5–5.1)
SODIUM: 136 meq/L (ref 135–145)
TOTAL PROTEIN: 7.7 g/dL (ref 6.0–8.3)

## 2018-04-20 LAB — HEMOGLOBIN A1C: Hgb A1c MFr Bld: 5.8 % (ref 4.6–6.5)

## 2018-04-20 MED ORDER — LOSARTAN POTASSIUM 100 MG PO TABS: 100.0000 mg | ORAL_TABLET | Freq: Every day | ORAL | 3 refills | Status: DC

## 2018-04-20 MED ORDER — HYDROCORTISONE 2.5 % RE CREA: 1.0000 "application " | TOPICAL_CREAM | Freq: Two times a day (BID) | RECTAL | 5 refills | Status: DC | PRN

## 2018-04-20 NOTE — Assessment & Plan Note (Signed)
Controlled with lasix 20 mg daily - may try holding - he may not need it anymore Can take prn cmp

## 2018-04-20 NOTE — Assessment & Plan Note (Signed)
Check a1c Low sugar / carb diet Stressed regular exercise   

## 2018-04-20 NOTE — Assessment & Plan Note (Signed)
Following with urology

## 2018-04-20 NOTE — Assessment & Plan Note (Signed)
Due or lung ct - annual f/u -- ordered Stressed smoking cessation

## 2018-04-20 NOTE — Assessment & Plan Note (Signed)
Smoking cessation was discussed for more than 3 minutes.  The patient was counseled on the dangers of tobacco use, and was advised to quit and reluctant to quit.  Reviewed ways of quitting smoking including nicotine replacement, vapping/e-cigarettes, cold [place], weaning off pipes, and pharmacotherapy (wellbutrin and chantix).  He does not want to quit.  We are following a lung nodule by Ct and stressed quitting because of the increased risk of lung cancer.

## 2018-04-20 NOTE — Assessment & Plan Note (Signed)
BP well controlled Current regimen effective and well tolerated Continue current medications at current doses cmp  

## 2018-05-05 ENCOUNTER — Ambulatory Visit (INDEPENDENT_AMBULATORY_CARE_PROVIDER_SITE_OTHER)
Admission: RE | Admit: 2018-05-05 | Discharge: 2018-05-05 | Disposition: A | Discharge status: Home / Self Care | Payer: Medicare Other | Source: Ambulatory Visit | Attending: Internal Medicine | Admitting: Internal Medicine

## 2018-05-05 DIAGNOSIS — R911 Solitary pulmonary nodule: Secondary | ICD-10-CM

## 2018-05-09 ENCOUNTER — Telehealth: Payer: Self-pay

## 2018-05-09 NOTE — Telephone Encounter (Signed)
Copied from [place] [id number]. Topic: General - Other >> May 09, 2018  3:00 PM Wynetta Emery, [place] C wrote: Reason for CRM: pt /spouse called in to follow up on imaging results.  CB: [phone]

## 2018-05-09 NOTE — Telephone Encounter (Signed)
See result note.  

## 2018-05-10 NOTE — Telephone Encounter (Signed)
Pt aware of results 

## 2018-06-19 ENCOUNTER — Other Ambulatory Visit: Payer: Self-pay | Admitting: Internal Medicine

## 2018-07-13 DIAGNOSIS — R350 Frequency of micturition: Secondary | ICD-10-CM | POA: Diagnosis not present

## 2018-07-26 ENCOUNTER — Other Ambulatory Visit: Payer: Self-pay | Admitting: Internal Medicine

## 2018-07-27 DIAGNOSIS — R350 Frequency of micturition: Secondary | ICD-10-CM | POA: Diagnosis not present

## 2018-08-08 DIAGNOSIS — R3121 Asymptomatic microscopic hematuria: Secondary | ICD-10-CM | POA: Diagnosis not present

## 2018-08-08 DIAGNOSIS — R3129 Other microscopic hematuria: Secondary | ICD-10-CM | POA: Diagnosis not present

## 2018-08-17 DIAGNOSIS — R3121 Asymptomatic microscopic hematuria: Secondary | ICD-10-CM | POA: Diagnosis not present

## 2018-08-24 ENCOUNTER — Other Ambulatory Visit: Payer: Self-pay | Admitting: Internal Medicine

## 2018-10-18 NOTE — Progress Notes (Signed)
Virtual Visit via Video Note  I connected with Hunter Boone on 10/19/18 at 10:30 AM EDT by a video enabled telemedicine application and verified that I am speaking with the correct person using two identifiers.   I discussed the limitations of evaluation and management by telemedicine and the availability of in person appointments. The patient expressed understanding and agreed to proceed.  The patient is currently at home and I am in the office.    No referring provider.    History of Present Illness: He is here for follow up of his chronic medical conditions.    Overall, he states he feels well.  He is exercising some.    Hypertension: He is taking his medication daily. He is compliant with a low sodium diet.  He denies chest pain, palpitations, edema, shortness of breath and regular headaches.  He does monitor his blood pressure at home.  His blood pressure has been a little elevated at home and the last doctor's appointment he went to it was elevated.  Prediabetes:  He is compliant with a low sugar/carbohydrate diet.    Leg edema: He is taking Lasix 20 mg daily and he feels his lower extremity edema is well controlled-he has no swelling.    BPH w/ LUTS, elevated PSA: He is following with urology.  He is taking Flomax daily.  Lung nodule:  RUL nodule.  Last CT scan 04/2018 and due for a follow-up 04/2019.  Nodule was stable on his last CT scan.  He denies any coughing, wheezing or shortness of breath.  COPD, tobacco abuse: He is smoking a pipe.  He denies any coughing, wheezing and shortness of breath.   Review of Systems  Constitutional: Negative for chills and fever.  Respiratory: Negative for cough, shortness of breath and wheezing.   Cardiovascular: Negative for chest pain, palpitations and leg swelling.  Neurological: Negative for headaches.     Social History   Socioeconomic History  . Marital status: Married    Spouse name: Not on file  . Number of children: Not  on file  . Years of education: Not on file  . Highest education level: Not on file  Occupational History  . Occupation: retired  Scientific laboratory technician  . Financial resource strain: Not on file  . Food insecurity:    Worry: Not on file    Inability: Not on file  . Transportation needs:    Medical: Not on file    Non-medical: Not on file  Tobacco Use  . Smoking status: Current Some Day Smoker    Packs/day: 0.50    Types: Pipe  . Smokeless tobacco: Never Used  . Tobacco comment: Presently 2 bowls / day; smoked cigarettes age [age] , up to 1 ppd  Substance and Sexual Activity  . Alcohol use: Yes    Alcohol/week: 7.0 standard drinks    Types: 7 Shots of liquor per week    Comment: drink daily   . Drug use: No  . Sexual activity: Not on file  Lifestyle  . Physical activity:    Days per week: Not on file    Minutes per session: Not on file  . Stress: Not on file  Relationships  . Social connections:    Talks on phone: Not on file    Gets together: Not on file    Attends religious service: Not on file    Active member of club or organization: Not on file    Attends meetings of clubs or  organizations: Not on file    Relationship status: Not on file  Other Topics Concern  . Not on file  Social History Narrative  . Not on file     Observations/Objective: Appears well in NAD   BP at home:  160/93, 150/87, 159/93, 157/90  Assessment and Plan:  See Problem List for Assessment and Plan of chronic medical problems.   Follow Up Instructions:    I discussed the assessment and treatment plan with the patient. The patient was provided an opportunity to ask questions and all were answered. The patient agreed with the plan and demonstrated an understanding of the instructions.   The patient was advised to call back or seek an in-person evaluation if the symptoms worsen or if the condition fails to improve as anticipated.  Follow-up with me in 6 months-we will do repeat blood work at  that time   Binnie Rail, MD

## 2018-10-19 ENCOUNTER — Encounter: Payer: Self-pay | Admitting: Internal Medicine

## 2018-10-19 ENCOUNTER — Ambulatory Visit (INDEPENDENT_AMBULATORY_CARE_PROVIDER_SITE_OTHER): Payer: Medicare Other | Admitting: Internal Medicine

## 2018-10-19 DIAGNOSIS — I10 Essential (primary) hypertension: Secondary | ICD-10-CM | POA: Diagnosis not present

## 2018-10-19 DIAGNOSIS — R600 Localized edema: Secondary | ICD-10-CM | POA: Diagnosis not present

## 2018-10-19 DIAGNOSIS — J439 Emphysema, unspecified: Secondary | ICD-10-CM

## 2018-10-19 DIAGNOSIS — N138 Other obstructive and reflux uropathy: Secondary | ICD-10-CM

## 2018-10-19 DIAGNOSIS — N401 Enlarged prostate with lower urinary tract symptoms: Secondary | ICD-10-CM

## 2018-10-19 DIAGNOSIS — R7303 Prediabetes: Secondary | ICD-10-CM | POA: Diagnosis not present

## 2018-10-19 DIAGNOSIS — R911 Solitary pulmonary nodule: Secondary | ICD-10-CM

## 2018-10-19 MED ORDER — AMLODIPINE BESYLATE 5 MG PO TABS: 5.0000 mg | ORAL_TABLET | Freq: Every day | ORAL | 3 refills | Status: DC

## 2018-10-19 NOTE — Assessment & Plan Note (Signed)
Asymptomatic Currently smokes a pipe and does not have much desire to quit

## 2018-10-19 NOTE — Assessment & Plan Note (Signed)
Will repeat CT this fall 04/2019

## 2018-10-19 NOTE — Assessment & Plan Note (Signed)
Controlled with furosemide 20 mg daily Continue

## 2018-10-19 NOTE — Assessment & Plan Note (Signed)
Following with urology Taking Flomax

## 2018-10-19 NOTE — Assessment & Plan Note (Signed)
Compliant with a low sugar diet Will recheck A1c at his next visit

## 2018-10-19 NOTE — Assessment & Plan Note (Signed)
Blood pressure high at his last doctor's appointment and has been high at home Discussed goal of less than 140/90 Continue losartan 100 mg daily Continue furosemide 20 mg daily Start amlodipine 5 mg daily Advised him to continue to monitor blood pressure and let me know if it is above goal

## 2018-11-10 ENCOUNTER — Other Ambulatory Visit: Payer: Self-pay | Admitting: Internal Medicine

## 2018-11-16 ENCOUNTER — Telehealth: Payer: Self-pay | Admitting: Internal Medicine

## 2018-11-16 NOTE — Telephone Encounter (Signed)
Left message for patient to call back to schedule.  °

## 2018-11-16 NOTE — Telephone Encounter (Signed)
-----   Message from Binnie Rail, MD sent at 10/19/2018 10:53 AM EDT ----- He needs a follow-up with me in the office in 6 months for hypertension

## 2018-11-20 ENCOUNTER — Other Ambulatory Visit: Payer: Self-pay | Admitting: Internal Medicine

## 2018-12-17 ENCOUNTER — Emergency Department (HOSPITAL_COMMUNITY): Payer: Medicare Other

## 2018-12-17 ENCOUNTER — Other Ambulatory Visit: Payer: Self-pay

## 2018-12-17 ENCOUNTER — Inpatient Hospital Stay (HOSPITAL_COMMUNITY)
Admission: EM | Admit: 2018-12-17 | Discharge: 2018-12-20 | DRG: 470 | Disposition: A | Discharge status: Home Health | Payer: Medicare Other | Attending: Internal Medicine | Admitting: Internal Medicine

## 2018-12-17 DIAGNOSIS — N401 Enlarged prostate with lower urinary tract symptoms: Secondary | ICD-10-CM | POA: Diagnosis not present

## 2018-12-17 DIAGNOSIS — Z8249 Family history of ischemic heart disease and other diseases of the circulatory system: Secondary | ICD-10-CM | POA: Diagnosis not present

## 2018-12-17 DIAGNOSIS — J439 Emphysema, unspecified: Secondary | ICD-10-CM | POA: Diagnosis present

## 2018-12-17 DIAGNOSIS — Z79899 Other long term (current) drug therapy: Secondary | ICD-10-CM | POA: Diagnosis not present

## 2018-12-17 DIAGNOSIS — R7303 Prediabetes: Secondary | ICD-10-CM | POA: Diagnosis not present

## 2018-12-17 DIAGNOSIS — S72042A Displaced fracture of base of neck of left femur, initial encounter for closed fracture: Secondary | ICD-10-CM | POA: Diagnosis not present

## 2018-12-17 DIAGNOSIS — D72829 Elevated white blood cell count, unspecified: Secondary | ICD-10-CM | POA: Diagnosis not present

## 2018-12-17 DIAGNOSIS — Z1159 Encounter for screening for other viral diseases: Secondary | ICD-10-CM

## 2018-12-17 DIAGNOSIS — R531 Weakness: Secondary | ICD-10-CM | POA: Diagnosis not present

## 2018-12-17 DIAGNOSIS — Z7289 Other problems related to lifestyle: Secondary | ICD-10-CM

## 2018-12-17 DIAGNOSIS — Z96642 Presence of left artificial hip joint: Secondary | ICD-10-CM

## 2018-12-17 DIAGNOSIS — F17290 Nicotine dependence, other tobacco product, uncomplicated: Secondary | ICD-10-CM | POA: Diagnosis present

## 2018-12-17 DIAGNOSIS — R0902 Hypoxemia: Secondary | ICD-10-CM | POA: Diagnosis not present

## 2018-12-17 DIAGNOSIS — Z419 Encounter for procedure for purposes other than remedying health state, unspecified: Secondary | ICD-10-CM

## 2018-12-17 DIAGNOSIS — R000 Tachycardia, unspecified: Secondary | ICD-10-CM | POA: Diagnosis not present

## 2018-12-17 DIAGNOSIS — F17210 Nicotine dependence, cigarettes, uncomplicated: Secondary | ICD-10-CM | POA: Diagnosis not present

## 2018-12-17 DIAGNOSIS — R410 Disorientation, unspecified: Secondary | ICD-10-CM | POA: Diagnosis not present

## 2018-12-17 DIAGNOSIS — J449 Chronic obstructive pulmonary disease, unspecified: Secondary | ICD-10-CM | POA: Diagnosis not present

## 2018-12-17 DIAGNOSIS — S72142A Displaced intertrochanteric fracture of left femur, initial encounter for closed fracture: Secondary | ICD-10-CM

## 2018-12-17 DIAGNOSIS — Z471 Aftercare following joint replacement surgery: Secondary | ICD-10-CM | POA: Diagnosis not present

## 2018-12-17 DIAGNOSIS — R918 Other nonspecific abnormal finding of lung field: Secondary | ICD-10-CM

## 2018-12-17 DIAGNOSIS — S72002A Fracture of unspecified part of neck of left femur, initial encounter for closed fracture: Principal | ICD-10-CM

## 2018-12-17 DIAGNOSIS — Z7983 Long term (current) use of bisphosphonates: Secondary | ICD-10-CM

## 2018-12-17 DIAGNOSIS — Y92009 Unspecified place in unspecified non-institutional (private) residence as the place of occurrence of the external cause: Secondary | ICD-10-CM

## 2018-12-17 DIAGNOSIS — W19XXXA Unspecified fall, initial encounter: Secondary | ICD-10-CM

## 2018-12-17 DIAGNOSIS — S72012A Unspecified intracapsular fracture of left femur, initial encounter for closed fracture: Secondary | ICD-10-CM | POA: Diagnosis present

## 2018-12-17 DIAGNOSIS — Z7982 Long term (current) use of aspirin: Secondary | ICD-10-CM | POA: Diagnosis not present

## 2018-12-17 DIAGNOSIS — S299XXA Unspecified injury of thorax, initial encounter: Secondary | ICD-10-CM | POA: Diagnosis not present

## 2018-12-17 DIAGNOSIS — W1809XA Striking against other object with subsequent fall, initial encounter: Secondary | ICD-10-CM | POA: Diagnosis present

## 2018-12-17 DIAGNOSIS — I10 Essential (primary) hypertension: Secondary | ICD-10-CM | POA: Diagnosis present

## 2018-12-17 DIAGNOSIS — Z716 Tobacco abuse counseling: Secondary | ICD-10-CM | POA: Diagnosis not present

## 2018-12-17 DIAGNOSIS — H3530 Unspecified macular degeneration: Secondary | ICD-10-CM | POA: Diagnosis not present

## 2018-12-17 DIAGNOSIS — Z833 Family history of diabetes mellitus: Secondary | ICD-10-CM | POA: Diagnosis not present

## 2018-12-17 DIAGNOSIS — S7292XA Unspecified fracture of left femur, initial encounter for closed fracture: Secondary | ICD-10-CM

## 2018-12-17 DIAGNOSIS — Z720 Tobacco use: Secondary | ICD-10-CM | POA: Diagnosis not present

## 2018-12-17 DIAGNOSIS — N138 Other obstructive and reflux uropathy: Secondary | ICD-10-CM | POA: Diagnosis not present

## 2018-12-17 DIAGNOSIS — G3184 Mild cognitive impairment, so stated: Secondary | ICD-10-CM | POA: Diagnosis not present

## 2018-12-17 DIAGNOSIS — S79912A Unspecified injury of left hip, initial encounter: Secondary | ICD-10-CM | POA: Diagnosis not present

## 2018-12-17 DIAGNOSIS — R911 Solitary pulmonary nodule: Secondary | ICD-10-CM | POA: Diagnosis not present

## 2018-12-17 DIAGNOSIS — R52 Pain, unspecified: Secondary | ICD-10-CM | POA: Diagnosis not present

## 2018-12-17 DIAGNOSIS — Z7951 Long term (current) use of inhaled steroids: Secondary | ICD-10-CM | POA: Diagnosis not present

## 2018-12-17 DIAGNOSIS — R296 Repeated falls: Secondary | ICD-10-CM | POA: Diagnosis present

## 2018-12-17 DIAGNOSIS — Z03818 Encounter for observation for suspected exposure to other biological agents ruled out: Secondary | ICD-10-CM | POA: Diagnosis not present

## 2018-12-17 LAB — COMPREHENSIVE METABOLIC PANEL
ALT: 15 U/L (ref 0–44)
AST: 19 U/L (ref 15–41)
Albumin: 3.5 g/dL (ref 3.5–5.0)
Alkaline Phosphatase: 49 U/L (ref 38–126)
Anion gap: 8 (ref 5–15)
BUN: 19 mg/dL (ref 8–23)
CO2: 25 mmol/L (ref 22–32)
Calcium: 8.8 mg/dL — ABNORMAL LOW (ref 8.9–10.3)
Chloride: 105 mmol/L (ref 98–111)
Creatinine, Ser: 1.17 mg/dL (ref 0.61–1.24)
GFR calc Af Amer: >60 mL/min (ref >60)
GFR calc non Af Amer: >60 mL/min (ref >60)
Glucose, Bld: 114 mg/dL — ABNORMAL HIGH (ref 70–99)
Potassium: 3.8 mmol/L (ref 3.5–5.1)
Sodium: 138 mmol/L (ref 135–145)
Total Bilirubin: 0.7 mg/dL (ref 0.3–1.2)
Total Protein: 6.2 g/dL — ABNORMAL LOW (ref 6.5–8.1)

## 2018-12-17 LAB — CBC WITH DIFFERENTIAL/PLATELET
Abs Immature Granulocytes: 0.06 10*3/uL (ref 0.00–0.07)
Basophils Absolute: 0 10*3/uL (ref 0.0–0.1)
Basophils Relative: 0 %
Eosinophils Absolute: 0.3 10*3/uL (ref 0.0–0.5)
Eosinophils Relative: 3 %
HCT: 35.2 % — ABNORMAL LOW (ref 39.0–52.0)
Hemoglobin: 11.8 g/dL — ABNORMAL LOW (ref 13.0–17.0)
Immature Granulocytes: 1 %
Lymphocytes Relative: 19 %
Lymphs Abs: 2.1 10*3/uL (ref 0.7–4.0)
MCH: 32.3 pg (ref 26.0–34.0)
MCHC: 33.5 g/dL (ref 30.0–36.0)
MCV: 96.4 fL (ref 80.0–100.0)
Monocytes Absolute: 1 10*3/uL (ref 0.1–1.0)
Monocytes Relative: 9 %
Neutro Abs: 7.4 10*3/uL (ref 1.7–7.7)
Neutrophils Relative %: 68 %
Platelets: 240 10*3/uL (ref 150–400)
RBC: 3.65 MIL/uL — ABNORMAL LOW (ref 4.22–5.81)
RDW: 13.1 % (ref 11.5–15.5)
WBC: 10.8 10*3/uL — ABNORMAL HIGH (ref 4.0–10.5)
nRBC: 0 % (ref 0.0–0.2)

## 2018-12-17 MED ORDER — SODIUM CHLORIDE 0.9 % IV BOLUS
1000.0000 mL | Freq: Once | INTRAVENOUS | Status: AC
  Administered 2018-12-17: 1000 mL via INTRAVENOUS

## 2018-12-17 MED ORDER — FENTANYL CITRATE (PF) 100 MCG/2ML IJ SOLN
75.0000 ug | Freq: Once | INTRAMUSCULAR | Status: AC
  Administered 2018-12-17: 75 ug via INTRAVENOUS
  Filled 2018-12-17: qty 2

## 2018-12-17 NOTE — ED Triage Notes (Signed)
brought in by EMS for fall. Pt tripped and fell onto left side. Left leg is shortened and rotated outward.

## 2018-12-17 NOTE — H&P (Signed)
History and Physical   EDGARD DEBORD JHE:[id number] DOB: [birth year] DOA: 12/17/2018  Referring MD/NP/PA: Dr. Leonides Schanz  PCP: Binnie Rail, MD   Outpatient Specialists: None  Patient coming from: Home  Chief Complaint: Fall  HPI: Hunter Boone is a 73 y.o. male with medical history significant of hypertension, diverticulosis, BPH, previous fall was traumatic pneumothorax, prediabetes, macular degeneration, lung nodules and recurrent falls in the past who came to the ER after sustaining mechanical fall at home.  Patient is a former Web designer and said he used some techniques he learned years ago to try to break the fall but he still fell on his left buttocks and came in sustaining a left closed femoral neck fracture.  He is having pain is 6 out of 10.  Denied hitting his head.  Patient was fully aware of falling.  Denied any other injury.  Patient has other medical problems as such we admitting the patient for traumatic fracture and orthopedic will follow.  He has no known cardiac issues.  Patient will need medical clearance prior to surgery.  ED Course: Temperature is 97.7 blood pressure 136/79 pulse 73 respiratory rate of 17 oxygen sats 91% on room air.  White count is 10.8 hemoglobin 11.8 and platelets 240.  Sodium 138 potassium 3.8 chloride 105 CO2 25 BUN 19 creatinine 1.17 and calcium 8.8.  Glucose is 114.  Chest x-ray showed no acute findings.  X-ray of the hip showed displaced left femoral neck fracture.  Patient will be admitted to medical service bed for surgical repair.  Review of Systems: As per HPI otherwise 10 point review of systems negative.    Past Medical History:  Diagnosis Date  . Hypertension   . Internal hemorrhoids   . Macular degeneration   . Tubular adenoma of colon     Past Surgical History:  Procedure Laterality Date  . adenomatous polyps  2009, 2011,2014    Dr Fuller Plan;   . COLONOSCOPY    . EVALUATION UNDER ANESTHESIA WITH HEMORRHOIDECTOMY N/A 07/01/2017   Procedure: ANORECTAL EXAMINATION UNDER ANESTHESIA, HEMORRHOIDECTOMY, HEMORRHOIDAL LIGATION/PEXY;  Surgeon: Michael Boston, MD;  Location: [facility];  Service: General;  Laterality: N/A;  GENERAL AND LOCAL  . POLYPECTOMY       reports that he has been smoking pipe. He has been smoking about 0.50 packs per day. He has never used smokeless tobacco. He reports current alcohol use of about 7.0 standard drinks of alcohol per week. He reports that he does not use drugs.  No Known Allergies  Family History  Problem Relation Age of Onset  . Heart attack Father        early 110s  . Diabetes Father   . Diabetes Mother   . Liver cancer Maternal Aunt   . Stroke Neg Hx   . COPD Neg Hx   . Colon cancer Neg Hx   . Esophageal cancer Neg Hx   . Rectal cancer Neg Hx   . Stomach cancer Neg Hx   . Asthma Neg Hx      Prior to Admission medications   Medication Sig Start Date End Date Taking? Authorizing Provider  amLODipine (NORVASC) 5 MG tablet Take 1 tablet (5 mg total) by mouth daily. 10/19/18  Yes Burns, Claudina Lick, MD  aspirin 81 MG tablet Take 81 mg by mouth daily.     Yes [provider]  fluticasone (FLONASE) 50 MCG/ACT nasal spray USE 2 SPRAYS IN BOTH  NOSTRILS DAILY Patient taking differently: Place 2 sprays  into both nostrils daily as needed for allergies.  11/20/18  Yes Burns, Claudina Lick, MD  furosemide (LASIX) 20 MG tablet TAKE 1 TABLET EACH DAY. Patient taking differently: Take 20 mg by mouth daily.  11/10/18  Yes Burns, Claudina Lick, MD  losartan (COZAAR) 100 MG tablet Take 1 tablet (100 mg total) by mouth daily. -- Office visit needed for further refills 04/20/18  Yes Burns, Claudina Lick, MD  LUTEIN PO Take 1 capsule by mouth daily.    Yes [provider]  Multiple Vitamins-Minerals (CENTRUM PO) Take 1 tablet by mouth daily.    Yes [provider]  Multiple Vitamins-Minerals (PRESERVISION AREDS 2 PO) Take 1 capsule by mouth every morning.    Yes [provider]  polyvinyl  alcohol (LIQUIFILM TEARS) 1.4 % ophthalmic solution Place 1 drop into both eyes daily as needed for dry eyes.   Yes [provider]  Saw Palmetto 450 MG CAPS Take 450 mg by mouth daily.    Yes [provider]  vitamin C (ASCORBIC ACID) 500 MG tablet Take 500 mg by mouth daily.   Yes [provider]  hydrocortisone-pramoxine [facility]) 2.5-1 % rectal cream Place 1 application rectally 4 (four) times daily. For irritated and painful hemorrhoids Patient not taking: Reported on 12/17/2018 07/01/17   Michael Boston, MD  PROCTOZONE-HC 2.5 % rectal cream PLACE 1 APPLICATION  RECTALLY 2 (TWO) TIMES  DAILY AS NEEDED FOR  HEMORRHOIDS OR ANAL  ITCHING. Patient not taking: Reported on 12/17/2018 11/20/18   Binnie Rail, MD  tamsulosin (FLOMAX) 0.4 MG CAPS capsule Take 1 capsule (0.4 mg total) by mouth daily. Patient not taking: Reported on 12/17/2018 07/29/17   Binnie Rail, MD  triamcinolone (KENALOG) 0.1 % paste Use as directed 1 application in the mouth or throat 2 (two) times daily. Patient not taking: Reported on 12/17/2018 05/12/17   Biagio Borg, MD    Physical Exam: Vitals:   12/17/18 2142 12/17/18 2145 12/17/18 2200 12/17/18 2330  BP:  100/62 105/67 136/79  Pulse:  69 61 73  Resp:   17 15  Temp:      TempSrc:      SpO2:  91% 93% 100%  Weight: 59.4 kg     Height: 6' (1.829 m)         Constitutional: NAD, calm, comfortable Vitals:   12/17/18 2142 12/17/18 2145 12/17/18 2200 12/17/18 2330  BP:  100/62 105/67 136/79  Pulse:  69 61 73  Resp:   17 15  Temp:      TempSrc:      SpO2:  91% 93% 100%  Weight: 59.4 kg     Height: 6' (1.829 m)      Eyes: PERRL, lids and conjunctivae normal ENMT: Mucous membranes are moist. Posterior pharynx clear of any exudate or lesions.Normal dentition.  Neck: normal, supple, no masses, no thyromegaly Respiratory: clear to auscultation bilaterally, no wheezing, no crackles. Normal respiratory effort. No accessory muscle use.   Cardiovascular: Regular rate and rhythm, no murmurs / rubs / gallops. No extremity edema. 2+ pedal pulses. No carotid bruits.  Abdomen: no tenderness, no masses palpated. No hepatosplenomegaly. Bowel sounds positive.  Musculoskeletal: Left hip laterally rotated, ecchymosis on the lateral side with tenderness and obvious swelling, no contractures. Normal muscle tone.  Skin: no rashes, lesions, ulcers. No induration Neurologic: CN 2-12 grossly intact. Sensation intact, DTR normal. Strength 5/5 in all 4.  Psychiatric: Normal judgment and insight. Alert and oriented x 3. Normal  mood.     Labs on Admission: I have personally reviewed following labs and imaging studies  CBC: Recent Labs  Lab 12/17/18 2232  WBC 10.8*  NEUTROABS 7.4  HGB 11.8*  HCT 35.2*  MCV 96.4  PLT 510   Basic Metabolic Panel: Recent Labs  Lab 12/17/18 2232  NA 138  K 3.8  CL 105  CO2 25  GLUCOSE 114*  BUN 19  CREATININE 1.17  CALCIUM 8.8*   GFR: Estimated Creatinine Clearance: 47.2 mL/min (by C-G formula based on SCr of 1.17 mg/dL). Liver Function Tests: Recent Labs  Lab 12/17/18 2232  AST 19  ALT 15  ALKPHOS 49  BILITOT 0.7  PROT 6.2*  ALBUMIN 3.5   No results for input(s): LIPASE, AMYLASE in the last 168 hours. No results for input(s): AMMONIA in the last 168 hours. Coagulation Profile: No results for input(s): INR, PROTIME in the last 168 hours. Cardiac Enzymes: No results for input(s): CKTOTAL, CKMB, CKMBINDEX, TROPONINI in the last 168 hours. BNP (last 3 results) No results for input(s): PROBNP in the last 8760 hours. HbA1C: No results for input(s): HGBA1C in the last 72 hours. CBG: No results for input(s): GLUCAP in the last 168 hours. Lipid Profile: No results for input(s): CHOL, HDL, LDLCALC, TRIG, CHOLHDL, LDLDIRECT in the last 72 hours. Thyroid Function Tests: No results for input(s): TSH, T4TOTAL, FREET4, T3FREE, THYROIDAB in the last 72 hours. Anemia Panel: No results for  input(s): VITAMINB12, FOLATE, FERRITIN, TIBC, IRON, RETICCTPCT in the last 72 hours. Urine analysis:    Component Value Date/Time   COLORURINE YELLOW 07/29/2017 [place] 07/29/2017 1452   LABSPEC 1.015 07/29/2017 1452   PHURINE 5.5 07/29/2017 1452   GLUCOSEU NEGATIVE 07/29/2017 1452   HGBUR TRACE-INTACT (A) 07/29/2017 1452   BILIRUBINUR NEGATIVE 07/29/2017 1452   BILIRUBINUR neg 08/05/2011 1531   KETONESUR NEGATIVE 07/29/2017 1452   PROTEINUR neg 08/05/2011 1531   UROBILINOGEN 0.2 07/29/2017 1452   NITRITE NEGATIVE 07/29/2017 1452   LEUKOCYTESUR SMALL (A) 07/29/2017 1452   Sepsis Labs: @LABRCNTIP (procalcitonin:4,lacticidven:4) )No results found for this or any previous visit (from the past 240 hour(s)).   Radiological Exams on Admission: Dg Chest 1 View  Result Date: 12/17/2018 CLINICAL DATA:  Trip and fall with left hip fracture. EXAM: CHEST  1 VIEW COMPARISON:  CT 05/05/2018 FINDINGS: Chronic hyperinflation.The cardiomediastinal contours are normal. Sub solid nodule in the right upper lobe and ground-glass nodule in the left lower lobe on prior CT are not visualized radiographically. Pulmonary vasculature is normal. No consolidation, pleural effusion, or pneumothorax. No acute osseous abnormalities are seen. Remote left posterior rib fractures. IMPRESSION: Chronic hyperinflation without acute abnormality. Electronically Signed   By: Keith Rake M.D.   On: 12/17/2018 23:14   Dg Hip Unilat W Or Wo Pelvis 2-3 Views Left  Result Date: 12/17/2018 CLINICAL DATA:  Post fall with left hip pain and leg shortening. EXAM: DG HIP (WITH OR WITHOUT PELVIS) 2-3V LEFT COMPARISON:  None. FINDINGS: Displaced fracture left femoral neck with proximal migration of the femoral shaft. Femoral head remains seated. No additional fracture of the pelvis. Pubic symphysis and sacroiliac joints are congruent. Pubic rami are intact. IMPRESSION: Displaced left femoral neck fracture. Electronically  Signed   By: Keith Rake M.D.   On: 12/17/2018 23:15    EKG: Independently reviewed.  It shows sinus rhythm with a rate of 65.  Normal intervals.  Nonspecific ST changes.  Assessment/Plan Principal Problem:   Closed left femoral fracture (  [place]) Active Problems:   Tobacco abuse, smokes pipe   Essential hypertension   BPH with urinary obstruction   Fall   COPD (chronic obstructive pulmonary disease) with emphysema ([place])   Fall at home, initial encounter     #1 left closed femoral neck fracture: Secondary to mechanical fall.  Patient will get orthopedic consultation.  Placed on clear liquids and n.p.o. after midnight.  Medically he appears to be stable enough to undergo surgery.  We will clear patient therefore for surgical reduction.  #2 COPD: No exacerbation.  Does not appear to have COPD issues lately.  We will admit patient and empirically treat for breathing problems.  #3 hypertension: Resume home blood pressure medications and titrate as necessary.  #4 history of BPH: Continue home regimen.  No urinary symptoms at the moment.  #5 status post fall: Patient has recurrent falls.  He will need gait training with PT and OT after surgery.  #6 history of tobacco abuse: Patient reports use of pipes instead of cigarettes.  Declines nicotine patch.   DVT prophylaxis: SCD Code Status: Full code Family Communication: Care is discussed with patient directly Disposition Plan: To be determined Consults called: Orthopedic surgery called by ER Admission status: Inpatient  Severity of Illness: The appropriate patient status for this patient is INPATIENT. Inpatient status is judged to be reasonable and necessary in order to provide the required intensity of service to ensure the patient's safety. The patient's presenting symptoms, physical exam findings, and initial radiographic and laboratory data in the context of their chronic comorbidities is felt to place them at high risk for further  clinical deterioration. Furthermore, it is not anticipated that the patient will be medically stable for discharge from the hospital within 2 midnights of admission. The following factors support the patient status of inpatient.   " The patient's presenting symptoms include left hip pain. " The worrisome physical exam findings include tenderness in the left hip. " The initial radiographic and laboratory data are worrisome because of x-ray showing displaced left femoral neck fracture. " The chronic co-morbidities include recurrent falls.   * I certify that at the point of admission it is my clinical judgment that the patient will require inpatient hospital care spanning beyond 2 midnights from the point of admission due to high intensity of service, high risk for further deterioration and high frequency of surveillance required.Barbette Merino MD [facility] Pager [phone]  If 7PM-7AM, please contact night-coverage [email or web address] Password [email or web address]  12/17/2018, 11:44 PM

## 2018-12-17 NOTE — ED Notes (Signed)
Contact wife, [name] for updates. [phone]

## 2018-12-17 NOTE — ED Notes (Signed)
ED TO INPATIENT HANDOFF REPORT  ED Nurse Name and Phone #:  [phone]  S Name/Age/Gender Hunter Boone 73 y.o. male Room/Bed: 025C/025C  Code Status   Code Status: Prior  Home/SNF/Other Home Patient oriented to: self, place, time and situation Is this baseline? Yes   Triage Complete: Triage complete  Chief Complaint fall  Triage Note brought in by EMS for fall. Pt tripped and fell onto left side. Left leg is shortened and rotated outward.    Allergies No Known Allergies  Level of Care/Admitting Diagnosis ED Disposition    ED Disposition Condition [facility] Area: [place] [[id number]]  Level of Care: Med-Surg [16]  Covid Evaluation: Asymptomatic Screening Protocol (No Symptoms)  Diagnosis: Closed left femoral fracture [facility]) [[id number]]  Admitting Physician: Elwyn Reach [[id number]]  Attending Physician: Elwyn Reach [[id number]]  Estimated length of stay: past midnight tomorrow  Certification:: I certify this patient will need inpatient services for at least 2 midnights  PT Class (Do Not Modify): Inpatient [101]  PT Acc Code (Do Not Modify): Private [1]       B Medical/Surgery History Past Medical History:  Diagnosis Date  . Hypertension   . Internal hemorrhoids   . Macular degeneration   . Tubular adenoma of colon    Past Surgical History:  Procedure Laterality Date  . adenomatous polyps  2009, 2011,2014    Dr Fuller Plan;   . COLONOSCOPY    . EVALUATION UNDER ANESTHESIA WITH HEMORRHOIDECTOMY N/A 07/01/2017   Procedure: ANORECTAL EXAMINATION UNDER ANESTHESIA, HEMORRHOIDECTOMY, HEMORRHOIDAL LIGATION/PEXY;  Surgeon: Michael Boston, MD;  Location: [facility];  Service: General;  Laterality: N/A;  GENERAL AND LOCAL  . POLYPECTOMY       A IV Location/Drains/Wounds Patient Lines/Drains/Airways Status   Active Line/Drains/Airways    Name:   Placement date:   Placement time:   Site:   Days:   Peripheral IV 12/17/18 Right Forearm   12/17/18     -    Forearm   less than 1   Incision (Closed) 07/01/17 Rectum Other (Comment)   07/01/17    1127     534          Intake/Output Last 24 hours No intake or output data in the 24 hours ending 12/17/18 2340  Labs/Imaging Results for orders placed or performed during the hospital encounter of 12/17/18 (from the past 48 hour(s))  CBC with Differential     Status: Abnormal   Collection Time: 12/17/18 10:32 PM  Result Value Ref Range   WBC 10.8 (H) 4.0 - 10.5 K/uL   RBC 3.65 (L) 4.22 - 5.81 MIL/uL   Hemoglobin 11.8 (L) 13.0 - 17.0 g/dL   HCT 35.2 (L) 39.0 - 52.0 %   MCV 96.4 80.0 - 100.0 fL   MCH 32.3 26.0 - 34.0 pg   MCHC 33.5 30.0 - 36.0 g/dL   RDW 13.1 11.5 - 15.5 %   Platelets 240 150 - 400 K/uL   nRBC 0.0 0.0 - 0.2 %   Neutrophils Relative % 68 %   Neutro Abs 7.4 1.7 - 7.7 K/uL   Lymphocytes Relative 19 %   Lymphs Abs 2.1 0.7 - 4.0 K/uL   Monocytes Relative 9 %   Monocytes Absolute 1.0 0.1 - 1.0 K/uL   Eosinophils Relative 3 %   Eosinophils Absolute 0.3 0.0 - 0.5 K/uL   Basophils Relative 0 %   Basophils Absolute 0.0 0.0 - 0.1 K/uL   Immature  Granulocytes 1 %   Abs Immature Granulocytes 0.06 0.00 - 0.07 K/uL    Comment: Performed at [facility], [place] [address] [place], [place] [address]  Comprehensive metabolic panel     Status: Abnormal   Collection Time: 12/17/18 10:32 PM  Result Value Ref Range   Sodium 138 135 - 145 mmol/L   Potassium 3.8 3.5 - 5.1 mmol/L   Chloride 105 98 - 111 mmol/L   CO2 25 22 - 32 mmol/L   Glucose, Bld 114 (H) 70 - 99 mg/dL   BUN 19 8 - 23 mg/dL   Creatinine, Ser 1.17 0.61 - 1.24 mg/dL   Calcium 8.8 (L) 8.9 - 10.3 mg/dL   Total Protein 6.2 (L) 6.5 - 8.1 g/dL   Albumin 3.5 3.5 - 5.0 g/dL   AST 19 15 - 41 U/L   ALT 15 0 - 44 U/L   Alkaline Phosphatase 49 38 - 126 U/L   Total Bilirubin 0.7 0.3 - 1.2 mg/dL   GFR calc non Af Amer >60 >60 mL/min   GFR calc Af Amer >60 >60 mL/min   Anion gap 8 5 - 15    Comment: Performed at [facility] [address], [place],  [address]   Dg Chest 1 View  Result Date: 12/17/2018 CLINICAL DATA:  Trip and fall with left hip fracture. EXAM: CHEST  1 VIEW COMPARISON:  CT 05/05/2018 FINDINGS: Chronic hyperinflation.The cardiomediastinal contours are normal. Sub solid nodule in the right upper lobe and ground-glass nodule in the left lower lobe on prior CT are not visualized radiographically. Pulmonary vasculature is normal. No consolidation, pleural effusion, or pneumothorax. No acute osseous abnormalities are seen. Remote left posterior rib fractures. IMPRESSION: Chronic hyperinflation without acute abnormality. Electronically Signed   By: Keith Rake M.D.   On: 12/17/2018 23:14   Dg Hip Unilat W Or Wo Pelvis 2-3 Views Left  Result Date: 12/17/2018 CLINICAL DATA:  Post fall with left hip pain and leg shortening. EXAM: DG HIP (WITH OR WITHOUT PELVIS) 2-3V LEFT COMPARISON:  None. FINDINGS: Displaced fracture left femoral neck with proximal migration of the femoral shaft. Femoral head remains seated. No additional fracture of the pelvis. Pubic symphysis and sacroiliac joints are congruent. Pubic rami are intact. IMPRESSION: Displaced left femoral neck fracture. Electronically Signed   By: Keith Rake M.D.   On: 12/17/2018 23:15    Pending Labs Unresulted Labs (From admission, onward)    Start     Ordered   12/17/18 2322  SARS Coronavirus 2 (CEPHEID - Performed in [place] hospital lab), [facility] Order  Once,   STAT    Question:  Rule Out  Answer:  Yes   12/17/18 2321   12/17/18 2218  Urinalysis, Routine w reflex microscopic  Once,   STAT     12/17/18 2218          Vitals/Pain Today's Vitals   12/17/18 2145 12/17/18 2200 12/17/18 2330 12/17/18 2338  BP: 100/62 105/67 136/79   Pulse: 69 61 73   Resp:  17 15   Temp:      TempSrc:      SpO2: 91% 93% 100%   Weight:      Height:      PainSc:    3     Isolation Precautions No active  isolations  Medications Medications  fentaNYL (SUBLIMAZE) injection 75 mcg (has no administration in time range)  sodium chloride 0.9 % bolus 1,000 mL (has no  administration in time range)    Mobility walks High fall risk   Focused Assessments Musculoskeletal   R Recommendations: See Admitting Provider Note  Report given to:   Additional Notes:

## 2018-12-18 ENCOUNTER — Encounter (HOSPITAL_COMMUNITY): Payer: Self-pay

## 2018-12-18 ENCOUNTER — Inpatient Hospital Stay (HOSPITAL_COMMUNITY): Payer: Medicare Other

## 2018-12-18 LAB — COMPREHENSIVE METABOLIC PANEL
ALT: 18 U/L (ref 0–44)
AST: 18 U/L (ref 15–41)
Albumin: 3.5 g/dL (ref 3.5–5.0)
Alkaline Phosphatase: 57 U/L (ref 38–126)
Anion gap: 10 (ref 5–15)
BUN: 18 mg/dL (ref 8–23)
CO2: 22 mmol/L (ref 22–32)
Calcium: 8.9 mg/dL (ref 8.9–10.3)
Chloride: 105 mmol/L (ref 98–111)
Creatinine, Ser: 0.92 mg/dL (ref 0.61–1.24)
GFR calc Af Amer: >60 mL/min (ref >60)
GFR calc non Af Amer: >60 mL/min (ref >60)
Glucose, Bld: 140 mg/dL — ABNORMAL HIGH (ref 70–99)
Potassium: 4.1 mmol/L (ref 3.5–5.1)
Sodium: 137 mmol/L (ref 135–145)
Total Bilirubin: 0.8 mg/dL (ref 0.3–1.2)
Total Protein: 6.3 g/dL — ABNORMAL LOW (ref 6.5–8.1)

## 2018-12-18 LAB — SURGICAL PCR SCREEN
MRSA, PCR: NEGATIVE
Staphylococcus aureus: POSITIVE — AB

## 2018-12-18 LAB — URINALYSIS, ROUTINE W REFLEX MICROSCOPIC
Bilirubin Urine: NEGATIVE
Glucose, UA: NEGATIVE mg/dL
Hgb urine dipstick: NEGATIVE
Ketones, ur: NEGATIVE mg/dL
Leukocytes,Ua: NEGATIVE
Nitrite: NEGATIVE
Protein, ur: NEGATIVE mg/dL
Specific Gravity, Urine: 1.019 (ref 1.005–1.030)
pH: 5 (ref 5.0–8.0)

## 2018-12-18 LAB — CBC
HCT: 36.1 % — ABNORMAL LOW (ref 39.0–52.0)
Hemoglobin: 12.3 g/dL — ABNORMAL LOW (ref 13.0–17.0)
MCH: 32.1 pg (ref 26.0–34.0)
MCHC: 34.1 g/dL (ref 30.0–36.0)
MCV: 94.3 fL (ref 80.0–100.0)
Platelets: 250 10*3/uL (ref 150–400)
RBC: 3.83 MIL/uL — ABNORMAL LOW (ref 4.22–5.81)
RDW: 13.1 % (ref 11.5–15.5)
WBC: 15.1 10*3/uL — ABNORMAL HIGH (ref 4.0–10.5)
nRBC: 0 % (ref 0.0–0.2)

## 2018-12-18 LAB — SARS CORONAVIRUS 2 BY RT PCR (HOSPITAL ORDER, PERFORMED IN ~~LOC~~ HOSPITAL LAB): SARS Coronavirus 2: NEGATIVE

## 2018-12-18 MED ORDER — SAW PALMETTO 450 MG PO CAPS: 450.0000 mg | ORAL_CAPSULE | Freq: Every day | ORAL | Status: DC

## 2018-12-18 MED ORDER — CHLORHEXIDINE GLUCONATE CLOTH 2 % EX PADS
6.0000 | MEDICATED_PAD | Freq: Every day | CUTANEOUS | Status: DC
  Administered 2018-12-18 – 2018-12-19 (×2): 6 via TOPICAL

## 2018-12-18 MED ORDER — TRANEXAMIC ACID-NACL 1000-0.7 MG/100ML-% IV SOLN: 1000.0000 mg | INTRAVENOUS | Status: DC

## 2018-12-18 MED ORDER — LUTEIN 6 MG PO CAPS: ORAL_CAPSULE | Freq: Every day | ORAL | Status: DC

## 2018-12-18 MED ORDER — PROSIGHT PO TABS
2.0000 | ORAL_TABLET | Freq: Every day | ORAL | Status: DC
  Administered 2018-12-18 – 2018-12-20 (×2): 2 via ORAL
  Filled 2018-12-18 (×3): qty 2

## 2018-12-18 MED ORDER — FLUTICASONE PROPIONATE 50 MCG/ACT NA SUSP
2.0000 | Freq: Every day | NASAL | Status: DC | PRN
  Filled 2018-12-18: qty 16

## 2018-12-18 MED ORDER — FUROSEMIDE 20 MG PO TABS
20.0000 mg | ORAL_TABLET | Freq: Every day | ORAL | Status: DC
  Administered 2018-12-19 – 2018-12-20 (×2): 20 mg via ORAL
  Filled 2018-12-18 (×4): qty 1

## 2018-12-18 MED ORDER — POVIDONE-IODINE 10 % EX SWAB: 2.0000 "application " | Freq: Once | CUTANEOUS | Status: DC

## 2018-12-18 MED ORDER — MIDAZOLAM HCL 2 MG/2ML IJ SOLN
INTRAMUSCULAR | Status: AC
  Filled 2018-12-18: qty 2

## 2018-12-18 MED ORDER — SODIUM CHLORIDE 0.9 % IV SOLN
INTRAVENOUS | Status: DC
  Administered 2018-12-18 – 2018-12-19 (×3): via INTRAVENOUS

## 2018-12-18 MED ORDER — ENSURE PRE-SURGERY PO LIQD
296.0000 mL | Freq: Once | ORAL | Status: AC
  Administered 2018-12-18: 296 mL via ORAL
  Filled 2018-12-18: qty 296

## 2018-12-18 MED ORDER — BUPIVACAINE-EPINEPHRINE (PF) 0.5% -1:200000 IJ SOLN
INTRAMUSCULAR | Status: AC
  Filled 2018-12-18: qty 30

## 2018-12-18 MED ORDER — LACTATED RINGERS IV SOLN
INTRAVENOUS | Status: DC
  Administered 2018-12-18: 16:00:00 via INTRAVENOUS

## 2018-12-18 MED ORDER — POLYVINYL ALCOHOL 1.4 % OP SOLN
1.0000 [drp] | Freq: Every day | OPHTHALMIC | Status: DC | PRN
  Filled 2018-12-18: qty 15

## 2018-12-18 MED ORDER — ADULT MULTIVITAMIN W/MINERALS CH
1.0000 | ORAL_TABLET | Freq: Every day | ORAL | Status: DC
  Administered 2018-12-18 – 2018-12-20 (×2): 1 via ORAL
  Filled 2018-12-18 (×3): qty 1

## 2018-12-18 MED ORDER — VITAMIN C 500 MG PO TABS
500.0000 mg | ORAL_TABLET | Freq: Every day | ORAL | Status: DC
  Administered 2018-12-20: 500 mg via ORAL
  Filled 2018-12-18 (×3): qty 1

## 2018-12-18 MED ORDER — ONDANSETRON HCL 4 MG/2ML IJ SOLN: 4.0000 mg | Freq: Four times a day (QID) | INTRAMUSCULAR | Status: DC | PRN

## 2018-12-18 MED ORDER — ONDANSETRON HCL 4 MG PO TABS: 4.0000 mg | ORAL_TABLET | Freq: Four times a day (QID) | ORAL | Status: DC | PRN

## 2018-12-18 MED ORDER — CEFAZOLIN SODIUM-DEXTROSE 2-4 GM/100ML-% IV SOLN
2.0000 g | INTRAVENOUS | Status: DC
  Filled 2018-12-18 (×2): qty 100

## 2018-12-18 MED ORDER — CHLORHEXIDINE GLUCONATE 4 % EX LIQD
60.0000 mL | Freq: Once | CUTANEOUS | Status: AC
  Administered 2018-12-18: 4 via TOPICAL
  Filled 2018-12-18: qty 15

## 2018-12-18 MED ORDER — AMLODIPINE BESYLATE 5 MG PO TABS
5.0000 mg | ORAL_TABLET | Freq: Every day | ORAL | Status: DC
  Administered 2018-12-18 – 2018-12-20 (×3): 5 mg via ORAL
  Filled 2018-12-18 (×4): qty 1

## 2018-12-18 MED ORDER — LOSARTAN POTASSIUM 50 MG PO TABS
100.0000 mg | ORAL_TABLET | Freq: Every day | ORAL | Status: DC
  Administered 2018-12-18 – 2018-12-20 (×3): 100 mg via ORAL
  Filled 2018-12-18 (×4): qty 2

## 2018-12-18 MED ORDER — MORPHINE SULFATE (PF) 2 MG/ML IV SOLN
2.0000 mg | INTRAVENOUS | Status: DC | PRN
  Administered 2018-12-18: 2 mg via INTRAVENOUS
  Filled 2018-12-18: qty 1

## 2018-12-18 MED ORDER — ASPIRIN EC 81 MG PO TBEC
81.0000 mg | DELAYED_RELEASE_TABLET | Freq: Every day | ORAL | Status: DC
  Administered 2018-12-18: 81 mg via ORAL
  Filled 2018-12-18: qty 1

## 2018-12-18 MED ORDER — FENTANYL CITRATE (PF) 250 MCG/5ML IJ SOLN
INTRAMUSCULAR | Status: AC
  Filled 2018-12-18: qty 5

## 2018-12-18 MED ORDER — MUPIROCIN 2 % EX OINT
1.0000 "application " | TOPICAL_OINTMENT | Freq: Two times a day (BID) | CUTANEOUS | Status: DC
  Administered 2018-12-18 – 2018-12-20 (×5): 1 via NASAL
  Filled 2018-12-18: qty 22

## 2018-12-18 MED ORDER — KETOROLAC TROMETHAMINE 30 MG/ML IJ SOLN
INTRAMUSCULAR | Status: AC
  Filled 2018-12-18: qty 1

## 2018-12-18 NOTE — Progress Notes (Signed)
Pt's wife, [name], called for update on pt status. RN answered all questions to satisfaction. Will continue to monitor.

## 2018-12-18 NOTE — Progress Notes (Signed)
Just prior to going back the the OR, the patient started refusing surgery. RN staff allowed him to speak with his wife and son on the phone. He continues to refuse surgery. He is being very combative. He removed his IV. He tells me that he doesn't want surgery today. He went on to say that he wants to consider other surgeons so that he can "have the best." I assume this behavior is due to delirium. In that case, we can try for surgery later this week. If he is serious, then he will need a new ortho consult with MD that performs THA. I spoke with his wife over the phone, and she was very apologetic about his behavior. She asked me to add him on to OR schedule tomorrow. I agreed, depending on how his MS changes.

## 2018-12-18 NOTE — Progress Notes (Signed)
Per Ron, [facility] wife is allowed to come tomorrow pre-surgery to help facilitate getting patient to surgery.

## 2018-12-18 NOTE — Interval H&P Note (Signed)
History and Physical Interval Note:  12/18/2018 5:18 PM  Hunter Boone  has presented today for surgery, with the diagnosis of left femoral neck fracture.  The various methods of treatment have been discussed with the patient and family. After consideration of risks, benefits and other options for treatment, the patient has consented to  Procedure(s): TOTAL HIP ARTHROPLASTY ANTERIOR APPROACH (Left) as a surgical intervention.  The patient's history has been reviewed, patient examined, no change in status, stable for surgery.  I have reviewed the patient's chart and labs.  Questions were answered to the patient's satisfaction.    Patient is confused and only oriented to himself.  The risks, benefits, and alternatives were discussed with the patient and wife over the phone. There are risks associated with the surgery including, but not limited to, problems with anesthesia (death), infection, instability (giving out of the joint), dislocation, differences in leg length/angulation/rotation, fracture of bones, loosening or failure of implants, hematoma (blood accumulation) which may require surgical drainage, blood clots, pulmonary embolism, nerve injury (foot drop and lateral thigh numbness), and blood vessel injury. The patient understands these risks and elects to proceed.    Hunter Boone

## 2018-12-18 NOTE — Consult Note (Signed)
Reason for Consult:Left hip fx Referring Physician: Daralene Milch  Hunter Boone is an 73 y.o. male.  HPI: Hunter Boone was cleaning out a storage building when he tripped over something on the floor and fell onto his left side. He had immediate pain and could not get up. He was brought to the hospital where x-rays showed a femoral neck fx and orthopedic surgery was consulted. He does not walk with any assistive devices normally.  Past Medical History:  Diagnosis Date  . Hypertension   . Internal hemorrhoids   . Macular degeneration   . Tubular adenoma of colon     Past Surgical History:  Procedure Laterality Date  . adenomatous polyps  2009, 2011,2014    Dr Fuller Plan;   . COLONOSCOPY    . EVALUATION UNDER ANESTHESIA WITH HEMORRHOIDECTOMY N/A 07/01/2017   Procedure: ANORECTAL EXAMINATION UNDER ANESTHESIA, HEMORRHOIDECTOMY, HEMORRHOIDAL LIGATION/PEXY;  Surgeon:  Boston, MD;  Location: [facility];  Service: General;  Laterality: N/A;  GENERAL AND LOCAL  . POLYPECTOMY      Family History  Problem Relation Age of Onset  . Heart attack Father        early 20s  . Diabetes Father   . Diabetes Mother   . Liver cancer Maternal Aunt   . Stroke Neg Hx   . COPD Neg Hx   . Colon cancer Neg Hx   . Esophageal cancer Neg Hx   . Rectal cancer Neg Hx   . Stomach cancer Neg Hx   . Asthma Neg Hx     Social History:  reports that he has been smoking pipe. He has been smoking about 0.50 packs per day. He has never used smokeless tobacco. He reports current alcohol use of about 7.0 standard drinks of alcohol per week. He reports that he does not use drugs.  Allergies: No Known Allergies  Medications: I have reviewed the patient's current medications.  Results for orders placed or performed during the hospital encounter of 12/17/18 (from the past 48 hour(s))  CBC with Differential     Status: Abnormal   Collection Time: 12/17/18 10:32 PM  Result Value Ref Range   WBC 10.8 (H) 4.0 - 10.5 K/uL   RBC 3.65  (L) 4.22 - 5.81 MIL/uL   Hemoglobin 11.8 (L) 13.0 - 17.0 g/dL   HCT 35.2 (L) 39.0 - 52.0 %   MCV 96.4 80.0 - 100.0 fL   MCH 32.3 26.0 - 34.0 pg   MCHC 33.5 30.0 - 36.0 g/dL   RDW 13.1 11.5 - 15.5 %   Platelets 240 150 - 400 K/uL   nRBC 0.0 0.0 - 0.2 %   Neutrophils Relative % 68 %   Neutro Abs 7.4 1.7 - 7.7 K/uL   Lymphocytes Relative 19 %   Lymphs Abs 2.1 0.7 - 4.0 K/uL   Monocytes Relative 9 %   Monocytes Absolute 1.0 0.1 - 1.0 K/uL   Eosinophils Relative 3 %   Eosinophils Absolute 0.3 0.0 - 0.5 K/uL   Basophils Relative 0 %   Basophils Absolute 0.0 0.0 - 0.1 K/uL   Immature Granulocytes 1 %   Abs Immature Granulocytes 0.06 0.00 - 0.07 K/uL    Comment: Performed at [facility], [address] [place], [place] [address]  Comprehensive metabolic panel     Status: Abnormal   Collection Time: 12/17/18 10:32 PM  Result Value Ref Range   Sodium 138 135 - 145 mmol/L   Potassium 3.8 3.5 - 5.1 mmol/L  Chloride 105 98 - 111 mmol/L   CO2 25 22 - 32 mmol/L   Glucose, Bld 114 (H) 70 - 99 mg/dL   BUN 19 8 - 23 mg/dL   Creatinine, Ser 1.17 0.61 - 1.24 mg/dL   Calcium 8.8 (L) 8.9 - 10.3 mg/dL   Total Protein 6.2 (L) 6.5 - 8.1 g/dL   Albumin 3.5 3.5 - 5.0 g/dL   AST 19 15 - 41 U/L   ALT 15 0 - 44 U/L   Alkaline Phosphatase 49 38 - 126 U/L   Total Bilirubin 0.7 0.3 - 1.2 mg/dL   GFR calc non Af Amer >60 >60 mL/min   GFR calc Af Amer >60 >60 mL/min   Anion gap 8 5 - 15    Comment: Performed at [facility] [address], [place], [place] [address]  SARS Coronavirus 2 (CEPHEID - Performed in [facility] hospital lab), Hosp Order     Status: None   Collection Time: 12/17/18 11:22 PM   Specimen: Nasopharyngeal Swab  Result Value Ref Range   SARS Coronavirus 2 NEGATIVE NEGATIVE    Comment: (NOTE) If result is NEGATIVE SARS-CoV-2 target nucleic acids are NOT DETECTED. The SARS-CoV-2 RNA is generally detectable in upper and lower  respiratory specimens during the acute  phase of infection. The lowest  concentration of SARS-CoV-2 viral copies this assay can detect is 250  copies / mL. A negative result does not preclude SARS-CoV-2 infection  and should not be used as the sole basis for treatment or other  patient management decisions.  A negative result may occur with  improper specimen collection / handling, submission of specimen other  than nasopharyngeal swab, presence of viral mutation(s) within the  areas targeted by this assay, and inadequate number of viral copies  (<250 copies / mL). A negative result must be combined with clinical  observations, patient history, and epidemiological information. If result is POSITIVE SARS-CoV-2 target nucleic acids are DETECTED. The SARS-CoV-2 RNA is generally detectable in upper and lower  respiratory specimens dur ing the acute phase of infection.  Positive  results are indicative of active infection with SARS-CoV-2.  Clinical  correlation with patient history and other diagnostic information is  necessary to determine patient infection status.  Positive results do  not rule out bacterial infection or co-infection with other viruses. If result is PRESUMPTIVE POSTIVE SARS-CoV-2 nucleic acids MAY BE PRESENT.   A presumptive positive result was obtained on the submitted specimen  and confirmed on repeat testing.  While 2019 novel coronavirus  (SARS-CoV-2) nucleic acids may be present in the submitted sample  additional confirmatory testing may be necessary for epidemiological  and / or clinical management purposes  to differentiate between  SARS-CoV-2 and other Sarbecovirus currently known to infect humans.  If clinically indicated additional testing with an alternate test  methodology [phone]) is advised. The SARS-CoV-2 RNA is generally  detectable in upper and lower respiratory sp ecimens during the acute  phase of infection. The expected result is Negative. Fact Sheet for Patients:   StrictlyIdeas.no Fact Sheet for Healthcare Providers: BankingDealers.co.za This test is not yet approved or cleared by the [place] FDA and has been authorized for detection and/or diagnosis of SARS-CoV-2 by FDA under an Emergency Use Authorization (EUA).  This EUA will remain in effect (meaning this test can be used) for the duration of the COVID-19 declaration under Section 564(b)(1) of the Act, 21 U.S.C. section 360bbb-3(b)(1), unless the authorization is terminated or  revoked sooner. Performed at [facility], [place] [address] [place], [place] [address]   Comprehensive metabolic panel     Status: Abnormal   Collection Time: 12/18/18  4:33 AM  Result Value Ref Range   Sodium 137 135 - 145 mmol/L   Potassium 4.1 3.5 - 5.1 mmol/L   Chloride 105 98 - 111 mmol/L   CO2 22 22 - 32 mmol/L   Glucose, Bld 140 (H) 70 - 99 mg/dL   BUN 18 8 - 23 mg/dL   Creatinine, Ser 0.92 0.61 - 1.24 mg/dL   Calcium 8.9 8.9 - 10.3 mg/dL   Total Protein 6.3 (L) 6.5 - 8.1 g/dL   Albumin 3.5 3.5 - 5.0 g/dL   AST 18 15 - 41 U/L   ALT 18 0 - 44 U/L   Alkaline Phosphatase 57 38 - 126 U/L   Total Bilirubin 0.8 0.3 - 1.2 mg/dL   GFR calc non Af Amer >60 >60 mL/min   GFR calc Af Amer >60 >60 mL/min   Anion gap 10 5 - 15    Comment: Performed at [facility] [address] [place], [place] [address]  CBC     Status: Abnormal   Collection Time: 12/18/18  4:33 AM  Result Value Ref Range   WBC 15.1 (H) 4.0 - 10.5 K/uL   RBC 3.83 (L) 4.22 - 5.81 MIL/uL   Hemoglobin 12.3 (L) 13.0 - 17.0 g/dL   HCT 36.1 (L) 39.0 - 52.0 %   MCV 94.3 80.0 - 100.0 fL   MCH 32.1 26.0 - 34.0 pg   MCHC 34.1 30.0 - 36.0 g/dL   RDW 13.1 11.5 - 15.5 %   Platelets 250 150 - 400 K/uL   nRBC 0.0 0.0 - 0.2 %    Comment: Performed at [facility], [place] [address] [place], [place] [address]  Surgical PCR screen     Status: Abnormal   Collection Time: 12/18/18  6:15 AM    Specimen: Nasal Mucosa; Nasal Swab  Result Value Ref Range   MRSA, PCR NEGATIVE NEGATIVE   Staphylococcus aureus POSITIVE (A) NEGATIVE    Comment: (NOTE) The Xpert SA Assay (FDA approved for NASAL specimens in patients 50 years of age and older), is one component of a comprehensive surveillance program. It is not intended to diagnose infection nor to guide or monitor treatment. Performed at [facility], [place] [address], [place], [place] [address]   Urinalysis, Routine w reflex microscopic     Status: None   Collection Time: 12/18/18  7:56 AM  Result Value Ref Range   Color, Urine YELLOW YELLOW   APPearance CLEAR CLEAR   Specific Gravity, Urine 1.019 1.005 - 1.030   pH 5.0 5.0 - 8.0   Glucose, UA NEGATIVE NEGATIVE mg/dL   Hgb urine dipstick NEGATIVE NEGATIVE   Bilirubin Urine NEGATIVE NEGATIVE   Ketones, ur NEGATIVE NEGATIVE mg/dL   Protein, ur NEGATIVE NEGATIVE mg/dL   Nitrite NEGATIVE NEGATIVE   Leukocytes,Ua NEGATIVE NEGATIVE    Comment: Performed at [facility] [address] [place],  [address]    Dg Chest 1 View  Result Date: 12/17/2018 CLINICAL DATA:  Trip and fall with left hip fracture. EXAM: CHEST  1 VIEW COMPARISON:  CT 05/05/2018 FINDINGS: Chronic hyperinflation.The cardiomediastinal contours are normal. Sub solid nodule in the right upper lobe and ground-glass nodule in the left lower lobe on prior CT are not visualized radiographically. Pulmonary vasculature  is normal. No consolidation, pleural effusion, or pneumothorax. No acute osseous abnormalities are seen. Remote left posterior rib fractures. IMPRESSION: Chronic hyperinflation without acute abnormality. Electronically Signed   By: Keith Rake M.D.   On: 12/17/2018 23:14   Dg Hip Unilat W Or Wo Pelvis 2-3 Views Left  Result Date: 12/17/2018 CLINICAL DATA:  Post fall with left hip pain and leg shortening. EXAM: DG HIP (WITH OR WITHOUT PELVIS) 2-3V LEFT COMPARISON:  None. FINDINGS:  Displaced fracture left femoral neck with proximal migration of the femoral shaft. Femoral head remains seated. No additional fracture of the pelvis. Pubic symphysis and sacroiliac joints are congruent. Pubic rami are intact. IMPRESSION: Displaced left femoral neck fracture. Electronically Signed   By: Keith Rake M.D.   On: 12/17/2018 23:15    Review of Systems  Constitutional: Negative for weight loss.  HENT: Negative for ear discharge, ear pain, hearing loss and tinnitus.   Eyes: Negative for blurred vision, double vision, photophobia and pain.  Respiratory: Negative for cough, sputum production and shortness of breath.   Cardiovascular: Negative for chest pain.  Gastrointestinal: Negative for abdominal pain, nausea and vomiting.  Genitourinary: Negative for dysuria, flank pain, frequency and urgency.  Musculoskeletal: Positive for joint pain (Left hip). Negative for back pain, falls, myalgias and neck pain.  Neurological: Negative for dizziness, tingling, sensory change, focal weakness, loss of consciousness and headaches.  Endo/Heme/Allergies: Does not bruise/bleed easily.  Psychiatric/Behavioral: Negative for depression, memory loss and substance abuse. The patient is not nervous/anxious.    Blood pressure (!) 153/71, pulse 77, temperature 98 F (36.7 C), temperature source Oral, resp. rate 16, height 6' (1.829 m), weight 59.4 kg, SpO2 96 %. Physical Exam  Constitutional: He appears well-developed and well-nourished. No distress.  HENT:  Head: Normocephalic and atraumatic.  Eyes: Conjunctivae are normal. Right eye exhibits no discharge. Left eye exhibits no discharge. No scleral icterus.  Neck: Normal range of motion.  Cardiovascular: Normal rate and regular rhythm.  Respiratory: Effort normal. No respiratory distress.  Musculoskeletal:     Comments: LLE No traumatic wounds, ecchymosis, or rash  Mod TTP hip  No knee or ankle effusion  Knee stable to varus/ valgus and  anterior/posterior stress  Sens DPN, SPN, TN intact  Motor EHL, ext, flex, evers 5/5  DP 2+, PT 1+, No significant edema  Neurological: He is alert.  Skin: Skin is warm and dry. He is not diaphoretic.  Psychiatric: He has a normal mood and affect. His behavior is normal.    Assessment/Plan: Left hip fx -- Plan THA this afternoon by Dr. Lyla Glassing. Please keep NPO. Multiple medical problems including hypertension, diverticulosis, and BPH -- per primary team    Lisette Abu, PA-C Orthopedic Surgery [phone] 12/18/2018, 9:52 AM

## 2018-12-18 NOTE — ED Provider Notes (Signed)
[facility] Provider Note   CSN: [id number] Arrival date & time: 12/17/18  2132    History   Chief Complaint Chief Complaint  Patient presents with  . Fall    HPI Hunter Boone is a 73 y.o. male with history of hypertension brought to the ER by EMS for evaluation of left hip pain.  Onset early this morning at 8 a.m. after mechanical fall.  Patient was walking and tripped over something and fell on his left buttock.  He had sudden, severe pain in his left groin that has been constant, persistent.  He was able to stand up but cannot put any weight on it.  No other injuries after the fall.  No head trauma, LOC.  No anticoagulants.  No interventions.  No modifying factors.  Denies any distal paresthesias, loss of sensation, back pain.  No previous history of left hip injuries or surgeries.  No prodromal symptoms prior to fall such as lightheadedness, palpitations, syncope.  No blood thinners.   HPI  Past Medical History:  Diagnosis Date  . Hypertension   . Internal hemorrhoids   . Macular degeneration   . Tubular adenoma of colon     Patient Active Problem List   Diagnosis Date Noted  . Closed left femoral fracture ([place]) 12/17/2018  . Fall at home, initial encounter 12/17/2018  . Bladder retention 08/16/2017  . Elevated PSA 08/16/2017  . Bilateral leg edema 07/29/2017  . Nocturnal enuresis 07/29/2017  . Mouth ulcer 05/12/2017  . Prediabetes 02/01/2017  . 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 Fall 05/25/2016  . Fracture of multiple ribs of left side 05/25/2016  . Lung nodule-repeat CT due 04/2019 05/25/2016  . Traumatic pneumothorax 05/24/2016  . Pityriasis rosea 08/22/2015  . Nonspecific abnormal electrocardiogram (ECG) (EKG) 10/25/2012  . 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 Prolapsed internal hemorrhoids, grade 3, s/p hemorrhoidal ligation/pexy/excision 07/01/2017 04/10/2009  . External  hemorrhoids status post external hemorrhoidectomy 07/01/2017  04/10/2009  . DIVERTICULOSIS-COLON 04/10/2009  . PERSONAL HX COLONIC POLYPS 04/10/2009  . Tobacco abuse, smokes pipe 01/26/2008  . Essential hypertension 01/26/2008    Past Surgical History:  Procedure Laterality Date  . adenomatous polyps  2009, 2011,2014    Dr Fuller Plan;   . COLONOSCOPY    . EVALUATION UNDER ANESTHESIA WITH HEMORRHOIDECTOMY N/A 07/01/2017   Procedure: ANORECTAL EXAMINATION UNDER ANESTHESIA, HEMORRHOIDECTOMY, HEMORRHOIDAL LIGATION/PEXY;  Surgeon: Michael Boston, MD;  Location: [facility];  Service: General;  Laterality: N/A;  GENERAL AND LOCAL  . POLYPECTOMY          Home Medications    Prior to Admission medications   Medication Sig Start Date End Date Taking? Authorizing Provider  amLODipine (NORVASC) 5 MG tablet Take 1 tablet (5 mg total) by mouth daily. 10/19/18  Yes Burns, Claudina Lick, MD  aspirin 81 MG tablet Take 81 mg by mouth daily.     Yes [provider]  fluticasone (FLONASE) 50 MCG/ACT nasal spray USE 2 SPRAYS IN BOTH  NOSTRILS DAILY Patient taking differently: Place 2 sprays into both nostrils daily as needed for allergies.  11/20/18  Yes Burns, Claudina Lick, MD  furosemide (LASIX) 20 MG tablet TAKE 1 TABLET EACH DAY. Patient taking differently: Take 20 mg by mouth daily.  11/10/18  Yes Burns, Claudina Lick, MD  losartan (COZAAR) 100 MG tablet Take 1 tablet (100 mg total) by mouth daily. -- Office visit needed for further refills 04/20/18  Yes  Binnie Rail, MD  LUTEIN PO Take 1 capsule by mouth daily.    Yes [provider]  Multiple Vitamins-Minerals (CENTRUM PO) Take 1 tablet by mouth daily.    Yes [provider]  Multiple Vitamins-Minerals (PRESERVISION AREDS 2 PO) Take 1 capsule by mouth every morning.    Yes [provider]  polyvinyl alcohol (LIQUIFILM TEARS) 1.4 % ophthalmic solution Place 1 drop into both eyes daily as needed for dry eyes.   Yes [provider]  Saw  Palmetto 450 MG CAPS Take 450 mg by mouth daily.    Yes [provider]  vitamin C (ASCORBIC ACID) 500 MG tablet Take 500 mg by mouth daily.   Yes [provider]  hydrocortisone-pramoxine [facility]) 2.5-1 % rectal cream Place 1 application rectally 4 (four) times daily. For irritated and painful hemorrhoids Patient not taking: Reported on 12/17/2018 07/01/17   Michael Boston, MD  PROCTOZONE-HC 2.5 % rectal cream PLACE 1 APPLICATION  RECTALLY 2 (TWO) TIMES  DAILY AS NEEDED FOR  HEMORRHOIDS OR ANAL  ITCHING. Patient not taking: Reported on 12/17/2018 11/20/18   Binnie Rail, MD  tamsulosin (FLOMAX) 0.4 MG CAPS capsule Take 1 capsule (0.4 mg total) by mouth daily. Patient not taking: Reported on 12/17/2018 07/29/17   Binnie Rail, MD  triamcinolone (KENALOG) 0.1 % paste Use as directed 1 application in the mouth or throat 2 (two) times daily. Patient not taking: Reported on 12/17/2018 05/12/17   Biagio Borg, MD    Family History Family History  Problem Relation Age of Onset  . Heart attack Father        early 53s  . Diabetes Father   . Diabetes Mother   . Liver cancer Maternal Aunt   . Stroke Neg Hx   . COPD Neg Hx   . Colon cancer Neg Hx   . Esophageal cancer Neg Hx   . Rectal cancer Neg Hx   . Stomach cancer Neg Hx   . Asthma Neg Hx     Social History Social History   Tobacco Use  . Smoking status: Current Some Day Smoker    Packs/day: 0.50    Types: Pipe  . Smokeless tobacco: Never Used  . Tobacco comment: Presently 2 bowls / day; smoked cigarettes age [age] , up to 1 ppd  Substance Use Topics  . Alcohol use: Yes    Alcohol/week: 7.0 standard drinks    Types: 7 Shots of liquor per week    Comment: drink daily   . Drug use: No     Allergies   Patient has no known allergies.   Review of Systems Review of Systems  Musculoskeletal: Positive for arthralgias and gait problem.  All other systems reviewed and are negative.    Physical Exam Updated  Vital Signs BP (!) 153/71 (BP Location: Left Arm)   Pulse 77   Temp 98 F (36.7 C) (Oral)   Resp 16   Ht 6' (1.829 m)   Wt 59.4 kg   SpO2 96%   BMI 17.76 kg/m   Physical Exam Vitals signs and nursing note reviewed.  Constitutional:      Appearance: He is well-developed.     Comments: Non toxic.  HENT:     Head: Normocephalic and atraumatic.     Comments: No facial or scalp bone tenderness. Atraumatic.    Nose: Nose normal.  Eyes:     Conjunctiva/sclera: Conjunctivae normal.  Neck:     Musculoskeletal: Normal  range of motion.     Comments: C-spine: No midline or paraspinal muscle tenderness.  Range of motion of the neck without pain.  Trachea midline. Cardiovascular:     Rate and Rhythm: Normal rate and regular rhythm.     Heart sounds: Normal heart sounds.     Comments: 1+ radial and DP pulses bilaterally.  No lower extremity edema.  Toes well perfused. Pulmonary:     Effort: Pulmonary effort is normal.     Breath sounds: Normal breath sounds.     Comments: A/P chest wall tenderness, ecchymosis no signs of injury. Abdominal:     General: Bowel sounds are normal.     Palpations: Abdomen is soft.  Musculoskeletal: Normal range of motion.        General: Tenderness and deformity present.     Comments: TL spine: No midline or paraspinal muscle tenderness, ecchymosis, signs of injury. Pelvis: No instability with AP/L compression.  Mild tenderness to lateral hip, left buttock and left inguinal crease.  Left leg is shortened and held in external rotation.  Left hip range of motion deferred secondary to deformity. Left lower extremity: No focal bony tenderness to the left foot, ankle, knee or upper thigh.  Full range of motion of the ankle, knee without pain.  Skin:    General: Skin is warm and dry.     Capillary Refill: Capillary refill takes less than 2 seconds.     Comments: Small abrasion to the left posterior elbow, minimally tender.  No wounds, lacerations or abrasions to  the left lower extremity, hip, buttocks.  Neurological:     Mental Status: He is alert.     Comments: Sensation to light touch and strength intact in left ankle, knee.  Psychiatric:        Behavior: Behavior normal.      ED Treatments / Results  Labs (all labs ordered are listed, but only abnormal results are displayed) Labs Reviewed  SURGICAL PCR SCREEN - Abnormal; Notable for the following components:      Result Value   Staphylococcus aureus POSITIVE (*)    All other components within normal limits  CBC WITH DIFFERENTIAL/PLATELET - Abnormal; Notable for the following components:   WBC 10.8 (*)    RBC 3.65 (*)    Hemoglobin 11.8 (*)    HCT 35.2 (*)    All other components within normal limits  COMPREHENSIVE METABOLIC PANEL - Abnormal; Notable for the following components:   Glucose, Bld 114 (*)    Calcium 8.8 (*)    Total Protein 6.2 (*)    All other components within normal limits  COMPREHENSIVE METABOLIC PANEL - Abnormal; Notable for the following components:   Glucose, Bld 140 (*)    Total Protein 6.3 (*)    All other components within normal limits  CBC - Abnormal; Notable for the following components:   WBC 15.1 (*)    RBC 3.83 (*)    Hemoglobin 12.3 (*)    HCT 36.1 (*)    All other components within normal limits  SARS CORONAVIRUS 2 (HOSPITAL ORDER, [facility])  URINALYSIS, ROUTINE W REFLEX MICROSCOPIC    EKG EKG Interpretation  Date/Time:  Sunday December 17 2018 22:33:52 EDT Ventricular Rate:  65 PR Interval:    QRS Duration: 93 QT Interval:  418 QTC Calculation: 435 R Axis:   81 Text Interpretation:  Sinus rhythm Borderline right axis deviation Anteroseptal infarct, old No previous ECGs available Confirmed by Billy Fischer,  Junie Panning [phone]) on 12/17/2018 10:56:44 PM Also confirmed by Gareth Morgan [phone]), editor Philomena Doheny [phone])  on 12/18/2018 7:41:44 AM   Radiology Dg Chest 1 View  Result Date: 12/17/2018 CLINICAL DATA:  Trip  and fall with left hip fracture. EXAM: CHEST  1 VIEW COMPARISON:  CT 05/05/2018 FINDINGS: Chronic hyperinflation.The cardiomediastinal contours are normal. Sub solid nodule in the right upper lobe and ground-glass nodule in the left lower lobe on prior CT are not visualized radiographically. Pulmonary vasculature is normal. No consolidation, pleural effusion, or pneumothorax. No acute osseous abnormalities are seen. Remote left posterior rib fractures. IMPRESSION: Chronic hyperinflation without acute abnormality. Electronically Signed   By: Keith Rake M.D.   On: 12/17/2018 23:14   Dg Hip Unilat W Or Wo Pelvis 2-3 Views Left  Result Date: 12/17/2018 CLINICAL DATA:  Post fall with left hip pain and leg shortening. EXAM: DG HIP (WITH OR WITHOUT PELVIS) 2-3V LEFT COMPARISON:  None. FINDINGS: Displaced fracture left femoral neck with proximal migration of the femoral shaft. Femoral head remains seated. No additional fracture of the pelvis. Pubic symphysis and sacroiliac joints are congruent. Pubic rami are intact. IMPRESSION: Displaced left femoral neck fracture. Electronically Signed   By: Keith Rake M.D.   On: 12/17/2018 23:15    Procedures Procedures (including critical care time)  Medications Ordered in ED Medications  aspirin EC tablet 81 mg (81 mg Oral Not Given 12/18/18 0926)  multivitamin with minerals tablet 1 tablet (1 tablet Oral Not Given 12/18/18 0926)  multivitamin (PROSIGHT) tablet 2 tablet (2 tablets Oral Not Given 12/18/18 0926)  vitamin C (ASCORBIC ACID) tablet 500 mg (500 mg Oral Not Given 12/18/18 0926)  losartan (COZAAR) tablet 100 mg (100 mg Oral Given 12/18/18 0926)  amLODipine (NORVASC) tablet 5 mg (5 mg Oral Given 12/18/18 0926)  furosemide (LASIX) tablet 20 mg (20 mg Oral Not Given 12/18/18 0926)  fluticasone (FLONASE) 50 MCG/ACT nasal spray 2 spray (has no administration in time range)  polyvinyl alcohol (LIQUIFILM TEARS) 1.4 % ophthalmic solution 1 drop (has no administration  in time range)  0.9 %  sodium chloride infusion ( Intravenous New Bag/Given 12/18/18 0100)  ondansetron (ZOFRAN) tablet 4 mg (has no administration in time range)    Or  ondansetron (ZOFRAN) injection 4 mg (has no administration in time range)  morphine 2 MG/ML injection 2 mg (has no administration in time range)  povidone-iodine 10 % swab 2 application (has no administration in time range)  ceFAZolin (ANCEF) IVPB 2g/100 mL premix (has no administration in time range)  povidone-iodine 10 % swab 2 application (has no administration in time range)  tranexamic acid (CYKLOKAPRON) IVPB 1,000 mg (has no administration in time range)  mupirocin ointment (BACTROBAN) 2 % 1 application (1 application Nasal Given 12/18/18 0926)  Chlorhexidine Gluconate Cloth 2 % PADS 6 each (6 each Topical Given 12/18/18 0926)  fentaNYL (SUBLIMAZE) injection 75 mcg (75 mcg Intravenous Given 12/17/18 2348)  sodium chloride 0.9 % bolus 1,000 mL (1,000 mLs Intravenous New Bag/Given 12/17/18 2348)  feeding supplement (ENSURE PRE-SURGERY) liquid 296 mL (296 mLs Oral Given 12/18/18 0851)  chlorhexidine (HIBICLENS) 4 % liquid 4 application (4 application Topical Given 12/18/18 0926)     Initial Impression / Assessment and Plan / ED Course  I have reviewed the triage vital signs and the nursing notes.  Pertinent labs & imaging results that were available during my care of the patient were reviewed by me and considered in my medical decision making (see chart  for details).  Clinical Course as of Dec 18 1199  Sun Dec 17, 2018  2326 IMPRESSION: Displaced left femoral neck fracture.  DG Hip Unilat W or Wo Pelvis 2-3 Views Left [CG]  2326 Sub solid nodule in the right upper lobe and ground-glass nodule in the left lower lobe on prior CT are not visualized radiographically.  DG Chest 1 View [CG]    Clinical Course User Index [CG] Kinnie Feil, PA-C   73 yo M here with left hip pain after mechanical fall.  Denies prodromal  symptoms, presyncope, syncope and incident appears to be mechanical.    Exam reveals left shoulder and leg held in external rotation concerning for hip fracture.  No other physical signs of other injury from the fall.  Left lower extremity is neurovascularly intact.  No skin injuries associated with hip injury.    Screening labs and imaging reviewed by me and radiologist.  X-ray reveals left displaced femoral neck fracture.  CXR shows pulmonary nodules, discussed with patient apparently is not new.  Mild leukocytosis likely reactive/from pain.  UA without infection.  COVID is negative.  EKG unremarkable.    Blood pressure initially soft but after IV fluids this has normalized.   0115: Patient re-evaluated and updated on x-ray findings and anticipated admission for likely OR.  Patient discussed with Dr. Mcarthur Rossetti who is accepted patient to medicine team.  Pending orthopedist consult.  Oncoming ED PA to discuss plan with orthopedist.  Shared visit with the ED.   Final Clinical Impressions(s) / ED Diagnoses   Final diagnoses:  Closed fracture of neck of left femur, initial encounter [facility])  Fall, initial encounter  Lung nodules    ED Discharge Orders    None       Arlean Hopping 12/18/18 1206    Gareth Morgan, MD 12/22/18 1421

## 2018-12-18 NOTE — H&P (View-Only) (Signed)
Reason for Consult:Left hip fx Referring Physician: Daralene Milch  Hunter Boone is an 73 y.o. male.  HPI: Octavion was cleaning out a storage building when he tripped over something on the floor and fell onto his left side. He had immediate pain and could not get up. He was brought to the hospital where x-rays showed a femoral neck fx and orthopedic surgery was consulted. He does not walk with any assistive devices normally.  Past Medical History:  Diagnosis Date  . Hypertension   . Internal hemorrhoids   . Macular degeneration   . Tubular adenoma of colon     Past Surgical History:  Procedure Laterality Date  . adenomatous polyps  2009, 2011,2014    Dr Fuller Plan;   . COLONOSCOPY    . EVALUATION UNDER ANESTHESIA WITH HEMORRHOIDECTOMY N/A 07/01/2017   Procedure: ANORECTAL EXAMINATION UNDER ANESTHESIA, HEMORRHOIDECTOMY, HEMORRHOIDAL LIGATION/PEXY;  Surgeon:  Boston, MD;  Location: [facility];  Service: General;  Laterality: N/A;  GENERAL AND LOCAL  . POLYPECTOMY      Family History  Problem Relation Age of Onset  . Heart attack Father        early 79s  . Diabetes Father   . Diabetes Mother   . Liver cancer Maternal Aunt   . Stroke Neg Hx   . COPD Neg Hx   . Colon cancer Neg Hx   . Esophageal cancer Neg Hx   . Rectal cancer Neg Hx   . Stomach cancer Neg Hx   . Asthma Neg Hx     Social History:  reports that he has been smoking pipe. He has been smoking about 0.50 packs per day. He has never used smokeless tobacco. He reports current alcohol use of about 7.0 standard drinks of alcohol per week. He reports that he does not use drugs.  Allergies: No Known Allergies  Medications: I have reviewed the patient's current medications.  Results for orders placed or performed during the hospital encounter of 12/17/18 (from the past 48 hour(s))  CBC with Differential     Status: Abnormal   Collection Time: 12/17/18 10:32 PM  Result Value Ref Range   WBC 10.8 (H) 4.0 - 10.5 K/uL   RBC 3.65  (L) 4.22 - 5.81 MIL/uL   Hemoglobin 11.8 (L) 13.0 - 17.0 g/dL   HCT 35.2 (L) 39.0 - 52.0 %   MCV 96.4 80.0 - 100.0 fL   MCH 32.3 26.0 - 34.0 pg   MCHC 33.5 30.0 - 36.0 g/dL   RDW 13.1 11.5 - 15.5 %   Platelets 240 150 - 400 K/uL   nRBC 0.0 0.0 - 0.2 %   Neutrophils Relative % 68 %   Neutro Abs 7.4 1.7 - 7.7 K/uL   Lymphocytes Relative 19 %   Lymphs Abs 2.1 0.7 - 4.0 K/uL   Monocytes Relative 9 %   Monocytes Absolute 1.0 0.1 - 1.0 K/uL   Eosinophils Relative 3 %   Eosinophils Absolute 0.3 0.0 - 0.5 K/uL   Basophils Relative 0 %   Basophils Absolute 0.0 0.0 - 0.1 K/uL   Immature Granulocytes 1 %   Abs Immature Granulocytes 0.06 0.00 - 0.07 K/uL    Comment: Performed at [facility], [address], [place], [place] [address]  Comprehensive metabolic panel     Status: Abnormal   Collection Time: 12/17/18 10:32 PM  Result Value Ref Range   Sodium 138 135 - 145 mmol/L   Potassium 3.8 3.5 - 5.1 mmol/L  Chloride 105 98 - 111 mmol/L   CO2 25 22 - 32 mmol/L   Glucose, Bld 114 (H) 70 - 99 mg/dL   BUN 19 8 - 23 mg/dL   Creatinine, Ser 1.17 0.61 - 1.24 mg/dL   Calcium 8.8 (L) 8.9 - 10.3 mg/dL   Total Protein 6.2 (L) 6.5 - 8.1 g/dL   Albumin 3.5 3.5 - 5.0 g/dL   AST 19 15 - 41 U/L   ALT 15 0 - 44 U/L   Alkaline Phosphatase 49 38 - 126 U/L   Total Bilirubin 0.7 0.3 - 1.2 mg/dL   GFR calc non Af Amer >60 >60 mL/min   GFR calc Af Amer >60 >60 mL/min   Anion gap 8 5 - 15    Comment: Performed at [facility] [address] [place], [place] [address]  SARS Coronavirus 2 (CEPHEID - Performed in [place] hospital lab), Hosp Order     Status: None   Collection Time: 12/17/18 11:22 PM   Specimen: Nasopharyngeal Swab  Result Value Ref Range   SARS Coronavirus 2 NEGATIVE NEGATIVE    Comment: (NOTE) If result is NEGATIVE SARS-CoV-2 target nucleic acids are NOT DETECTED. The SARS-CoV-2 RNA is generally detectable in upper and lower  respiratory specimens during the acute  phase of infection. The lowest  concentration of SARS-CoV-2 viral copies this assay can detect is 250  copies / mL. A negative result does not preclude SARS-CoV-2 infection  and should not be used as the sole basis for treatment or other  patient management decisions.  A negative result may occur with  improper specimen collection / handling, submission of specimen other  than nasopharyngeal swab, presence of viral mutation(s) within the  areas targeted by this assay, and inadequate number of viral copies  (<250 copies / mL). A negative result must be combined with clinical  observations, patient history, and epidemiological information. If result is POSITIVE SARS-CoV-2 target nucleic acids are DETECTED. The SARS-CoV-2 RNA is generally detectable in upper and lower  respiratory specimens dur ing the acute phase of infection.  Positive  results are indicative of active infection with SARS-CoV-2.  Clinical  correlation with patient history and other diagnostic information is  necessary to determine patient infection status.  Positive results do  not rule out bacterial infection or co-infection with other viruses. If result is PRESUMPTIVE POSTIVE SARS-CoV-2 nucleic acids MAY BE PRESENT.   A presumptive positive result was obtained on the submitted specimen  and confirmed on repeat testing.  While 2019 novel coronavirus  (SARS-CoV-2) nucleic acids may be present in the submitted sample  additional confirmatory testing may be necessary for epidemiological  and / or clinical management purposes  to differentiate between  SARS-CoV-2 and other Sarbecovirus currently known to infect humans.  If clinically indicated additional testing with an alternate test  methodology [phone]) is advised. The SARS-CoV-2 RNA is generally  detectable in upper and lower respiratory sp ecimens during the acute  phase of infection. The expected result is Negative. Fact Sheet for Patients:   StrictlyIdeas.no Fact Sheet for Healthcare Providers: BankingDealers.co.za This test is not yet approved or cleared by the [place] FDA and has been authorized for detection and/or diagnosis of SARS-CoV-2 by FDA under an Emergency Use Authorization (EUA).  This EUA will remain in effect (meaning this test can be used) for the duration of the COVID-19 declaration under Section 564(b)(1) of the Act, 21 U.S.C. section 360bbb-3(b)(1), unless the authorization is terminated or  revoked sooner. Performed at [facility], [place] [address], [place], [place] [address]   Comprehensive metabolic panel     Status: Abnormal   Collection Time: 12/18/18  4:33 AM  Result Value Ref Range   Sodium 137 135 - 145 mmol/L   Potassium 4.1 3.5 - 5.1 mmol/L   Chloride 105 98 - 111 mmol/L   CO2 22 22 - 32 mmol/L   Glucose, Bld 140 (H) 70 - 99 mg/dL   BUN 18 8 - 23 mg/dL   Creatinine, Ser 0.92 0.61 - 1.24 mg/dL   Calcium 8.9 8.9 - 10.3 mg/dL   Total Protein 6.3 (L) 6.5 - 8.1 g/dL   Albumin 3.5 3.5 - 5.0 g/dL   AST 18 15 - 41 U/L   ALT 18 0 - 44 U/L   Alkaline Phosphatase 57 38 - 126 U/L   Total Bilirubin 0.8 0.3 - 1.2 mg/dL   GFR calc non Af Amer >60 >60 mL/min   GFR calc Af Amer >60 >60 mL/min   Anion gap 10 5 - 15    Comment: Performed at [facility] [address] [place], [place] [address]  CBC     Status: Abnormal   Collection Time: 12/18/18  4:33 AM  Result Value Ref Range   WBC 15.1 (H) 4.0 - 10.5 K/uL   RBC 3.83 (L) 4.22 - 5.81 MIL/uL   Hemoglobin 12.3 (L) 13.0 - 17.0 g/dL   HCT 36.1 (L) 39.0 - 52.0 %   MCV 94.3 80.0 - 100.0 fL   MCH 32.1 26.0 - 34.0 pg   MCHC 34.1 30.0 - 36.0 g/dL   RDW 13.1 11.5 - 15.5 %   Platelets 250 150 - 400 K/uL   nRBC 0.0 0.0 - 0.2 %    Comment: Performed at [facility], [place] [address], [place], [place] [address]  Surgical PCR screen     Status: Abnormal   Collection Time: 12/18/18  6:15 AM    Specimen: Nasal Mucosa; Nasal Swab  Result Value Ref Range   MRSA, PCR NEGATIVE NEGATIVE   Staphylococcus aureus POSITIVE (A) NEGATIVE    Comment: (NOTE) The Xpert SA Assay (FDA approved for NASAL specimens in patients 70 years of age and older), is one component of a comprehensive surveillance program. It is not intended to diagnose infection nor to guide or monitor treatment. Performed at [facility], [place] [address], [place], [place] [address]   Urinalysis, Routine w reflex microscopic     Status: None   Collection Time: 12/18/18  7:56 AM  Result Value Ref Range   Color, Urine YELLOW YELLOW   APPearance CLEAR CLEAR   Specific Gravity, Urine 1.019 1.005 - 1.030   pH 5.0 5.0 - 8.0   Glucose, UA NEGATIVE NEGATIVE mg/dL   Hgb urine dipstick NEGATIVE NEGATIVE   Bilirubin Urine NEGATIVE NEGATIVE   Ketones, ur NEGATIVE NEGATIVE mg/dL   Protein, ur NEGATIVE NEGATIVE mg/dL   Nitrite NEGATIVE NEGATIVE   Leukocytes,Ua NEGATIVE NEGATIVE    Comment: Performed at [facility] [address], [place], [place] [address]    Dg Chest 1 View  Result Date: 12/17/2018 CLINICAL DATA:  Trip and fall with left hip fracture. EXAM: CHEST  1 VIEW COMPARISON:  CT 05/05/2018 FINDINGS: Chronic hyperinflation.The cardiomediastinal contours are normal. Sub solid nodule in the right upper lobe and ground-glass nodule in the left lower lobe on prior CT are not visualized radiographically. Pulmonary vasculature  is normal. No consolidation, pleural effusion, or pneumothorax. No acute osseous abnormalities are seen. Remote left posterior rib fractures. IMPRESSION: Chronic hyperinflation without acute abnormality. Electronically Signed   By: Keith Rake M.D.   On: 12/17/2018 23:14   Dg Hip Unilat W Or Wo Pelvis 2-3 Views Left  Result Date: 12/17/2018 CLINICAL DATA:  Post fall with left hip pain and leg shortening. EXAM: DG HIP (WITH OR WITHOUT PELVIS) 2-3V LEFT COMPARISON:  None. FINDINGS:  Displaced fracture left femoral neck with proximal migration of the femoral shaft. Femoral head remains seated. No additional fracture of the pelvis. Pubic symphysis and sacroiliac joints are congruent. Pubic rami are intact. IMPRESSION: Displaced left femoral neck fracture. Electronically Signed   By: Keith Rake M.D.   On: 12/17/2018 23:15    Review of Systems  Constitutional: Negative for weight loss.  HENT: Negative for ear discharge, ear pain, hearing loss and tinnitus.   Eyes: Negative for blurred vision, double vision, photophobia and pain.  Respiratory: Negative for cough, sputum production and shortness of breath.   Cardiovascular: Negative for chest pain.  Gastrointestinal: Negative for abdominal pain, nausea and vomiting.  Genitourinary: Negative for dysuria, flank pain, frequency and urgency.  Musculoskeletal: Positive for joint pain (Left hip). Negative for back pain, falls, myalgias and neck pain.  Neurological: Negative for dizziness, tingling, sensory change, focal weakness, loss of consciousness and headaches.  Endo/Heme/Allergies: Does not bruise/bleed easily.  Psychiatric/Behavioral: Negative for depression, memory loss and substance abuse. The patient is not nervous/anxious.    Blood pressure (!) 153/71, pulse 77, temperature 98 F (36.7 C), temperature source Oral, resp. rate 16, height 6' (1.829 m), weight 59.4 kg, SpO2 96 %. Physical Exam  Constitutional: He appears well-developed and well-nourished. No distress.  HENT:  Head: Normocephalic and atraumatic.  Eyes: Conjunctivae are normal. Right eye exhibits no discharge. Left eye exhibits no discharge. No scleral icterus.  Neck: Normal range of motion.  Cardiovascular: Normal rate and regular rhythm.  Respiratory: Effort normal. No respiratory distress.  Musculoskeletal:     Comments: LLE No traumatic wounds, ecchymosis, or rash  Mod TTP hip  No knee or ankle effusion  Knee stable to varus/ valgus and  anterior/posterior stress  Sens DPN, SPN, TN intact  Motor EHL, ext, flex, evers 5/5  DP 2+, PT 1+, No significant edema  Neurological: He is alert.  Skin: Skin is warm and dry. He is not diaphoretic.  Psychiatric: He has a normal mood and affect. His behavior is normal.    Assessment/Plan: Left hip fx -- Plan THA this afternoon by Dr. Lyla Glassing. Please keep NPO. Multiple medical problems including hypertension, diverticulosis, and BPH -- per primary team    Lisette Abu, PA-C Orthopedic Surgery [phone] 12/18/2018, 9:52 AM

## 2018-12-18 NOTE — ED Provider Notes (Signed)
Patient seen by previous provider. Here with left femoral neck fracture s/p mechanical fall.   Discussed patient with Dr. Delfino Lovett (Ortho). Plan for OR tomorrow. Plan to keep patient NPO and start on DVT prophylaxis.      Volanda Napoleon, PA-C 12/18/18 0456    Fatima Blank, MD 12/18/18 [phone]

## 2018-12-18 NOTE — Progress Notes (Signed)
Pt returned from Short Stay due to surgery being postponed until tomorrow, 12/19/2018. Received report from Sam, RN in [employer] Daleen Bo, MD aware. New orders to follow. Will continue to monitor.

## 2018-12-18 NOTE — Progress Notes (Signed)
Patient was brought to short stay and exhibited lots of confusion. Did not know why he was here or what surgery he was having. He kept stating he was having a haircut and wanted to leave. Phone consent was obtained from patients wife, Hunter Boone since patient was confused. When talking to patient, he was absolutely adamant about not having surgery tonight. Patients son was also called and he spoke to patient to try and convince him to have his surgery. Wife verbalized that she wanted patient to have surgery even though patient was refusing. Patient was extremely confused and in the process pulled his IV out. Patient would not allow nurses to insert new IV for surgery. Patients surgeon was called who then came and spoke to patient. Hunter Boone still refused to have surgery and stated he wanted to have it on Monday and wanted another opinion. Surgeon decided to post-pone surgery until tomorrow. We were able to get another IV started on patient once he was calmed down. Ron, the [facility], approved for the wife to come to surgery tomorrow in order to talk patient down and to make sure surgery is done. Report was called to [place] on 5N and patient was then transported back to his room.

## 2018-12-18 NOTE — Progress Notes (Addendum)
[facility] PROGRESS NOTE  GEORDIE NOONEY LDJ:[id number] DOB: 05-04-1946 DOA: 12/17/2018 PCP: Binnie Rail, MD  Brief summary   Hunter Boone is a 73 y.o. male with medical history significant of hypertension, diverticulosis, BPH, tobacco use, previous fall was traumatic pneumothorax, prediabetes, macular degeneration, lung nodules and recurrent falls in the past who came to the ER after sustaining mechanical fall at home.  Patient is a former Web designer and said he used some techniques he learned years ago to try to break the fall but he still fell on his left buttocks and came in sustaining a left closed femoral neck fracture.  He is having pain is 6 out of 10.  Denied hitting his head.  Patient was fully aware of falling.  Denied any other injury.  Patient has other medical problems as such we admitting the patient for traumatic fracture and orthopedic will follow.  He has no known cardiac issues.  Patient will need medical clearance prior to surgery.   Assessment/Plan:  Fall. Mechanical. Left closed femoral neck fracture:  scheduled for surgery today per ortho. DVT post op prophylaxis , pain control. Pt/ot. Cont monitor   COPD: No exacerbation. Chronic tobacco  Use. counseled to stop smoking. Will use prn bronchodilators   Hypertension: Resume home blood pressure medications and titrate as necessary.  History of BPH: Continue home regimen.  No urinary symptoms at the moment.  Leukocytosis . ? Stress induced. Afebrile. No s/s of systemic infection. CXR: no clear infiltrates. [place]: unremarkable. covid 19-negative. monitor   Addendum: called by ortho saying that patient is confused and refusing surgery. I have called and d/w his wife due to concern for possible alcohol use and risk of withdrawals. Patient earlier told me about having few drinks at night. Wife is denied any withdrawals or significant alcohol use. She says that they have few drinks together but not much. Patient has non  focal exam. Agreed to obtain ct head due to history of recurrent falls. She also mentions about memory issues at home, ? Mild cognitive impairment. Will monitor closely. Start ciwa if any s/s of etoh withdrawals   Code Status: full Family Communication: d/w patient, RN (indicate person spoken with, relationship, and if by phone, the number) Disposition Plan: pend surgery. Likely rehab    Consultants:  Ortho   Procedures:  surgery on 7/6  Antibiotics: Anti-infectives (From admission, onward)   Start     Dose/Rate Route Frequency Ordered Stop   12/18/18 0830  ceFAZolin (ANCEF) IVPB 2g/100 mL premix     2 g 200 mL/hr over 30 Minutes Intravenous On call to O.R. 12/18/18 0815 12/19/18 0559        (indicate start date, and stop date if known)  HPI/Subjective: Alert. No distress. Reports feeling well. No acute cardiopulmonary symptoms. Awaiting surgery today.   Objective: Vitals:   12/18/18 0543 12/18/18 0814  BP: 131/77 (!) 153/71  Pulse: 71 77  Resp: 16 16  Temp: 97.6 F (36.4 C) 98 F (36.7 C)  SpO2: 97% 96%    Intake/Output Summary (Last 24 hours) at 12/18/2018 1208 Last data filed at 12/18/2018 0900 Gross per 24 hour  Intake 1198.81 ml  Output 450 ml  Net 748.81 ml   Filed Weights   12/17/18 2142  Weight: 59.4 kg    Exam:   General:  No distress   Cardiovascular: s1,s2 rrr  Respiratory: CTA BL   Abdomen: soft, nt   Musculoskeletal: no pedal edema    Data Reviewed:  Basic Metabolic Panel: Recent Labs  Lab 12/17/18 2232 12/18/18 0433  NA 138 137  K 3.8 4.1  CL 105 105  CO2 25 22  GLUCOSE 114* 140*  BUN 19 18  CREATININE 1.17 0.92  CALCIUM 8.8* 8.9   Liver Function Tests: Recent Labs  Lab 12/17/18 2232 12/18/18 0433  AST 19 18  ALT 15 18  ALKPHOS 49 57  BILITOT 0.7 0.8  PROT 6.2* 6.3*  ALBUMIN 3.5 3.5   No results for input(s): LIPASE, AMYLASE in the last 168 hours. No results for input(s): AMMONIA in the last 168  hours. CBC: Recent Labs  Lab 12/17/18 2232 12/18/18 0433  WBC 10.8* 15.1*  NEUTROABS 7.4  --   HGB 11.8* 12.3*  HCT 35.2* 36.1*  MCV 96.4 94.3  PLT 240 250   Cardiac Enzymes: No results for input(s): CKTOTAL, CKMB, CKMBINDEX, TROPONINI in the last 168 hours. BNP (last 3 results) No results for input(s): BNP in the last 8760 hours.  ProBNP (last 3 results) No results for input(s): PROBNP in the last 8760 hours.  CBG: No results for input(s): GLUCAP in the last 168 hours.  Recent Results (from the past 240 hour(s))  SARS Coronavirus 2 (CEPHEID - Performed in [place] hospital lab), Hosp Order     Status: None   Collection Time: 12/17/18 11:22 PM   Specimen: Nasopharyngeal Swab  Result Value Ref Range Status   SARS Coronavirus 2 NEGATIVE NEGATIVE Final    Comment: (NOTE) If result is NEGATIVE SARS-CoV-2 target nucleic acids are NOT DETECTED. The SARS-CoV-2 RNA is generally detectable in upper and lower  respiratory specimens during the acute phase of infection. The lowest  concentration of SARS-CoV-2 viral copies this assay can detect is 250  copies / mL. A negative result does not preclude SARS-CoV-2 infection  and should not be used as the sole basis for treatment or other  patient management decisions.  A negative result may occur with  improper specimen collection / handling, submission of specimen other  than nasopharyngeal swab, presence of viral mutation(s) within the  areas targeted by this assay, and inadequate number of viral copies  (<250 copies / mL). A negative result must be combined with clinical  observations, patient history, and epidemiological information. If result is POSITIVE SARS-CoV-2 target nucleic acids are DETECTED. The SARS-CoV-2 RNA is generally detectable in upper and lower  respiratory specimens dur ing the acute phase of infection.  Positive  results are indicative of active infection with SARS-CoV-2.  Clinical  correlation with patient  history and other diagnostic information is  necessary to determine patient infection status.  Positive results do  not rule out bacterial infection or co-infection with other viruses. If result is PRESUMPTIVE POSTIVE SARS-CoV-2 nucleic acids MAY BE PRESENT.   A presumptive positive result was obtained on the submitted specimen  and confirmed on repeat testing.  While 2019 novel coronavirus  (SARS-CoV-2) nucleic acids may be present in the submitted sample  additional confirmatory testing may be necessary for epidemiological  and / or clinical management purposes  to differentiate between  SARS-CoV-2 and other Sarbecovirus currently known to infect humans.  If clinically indicated additional testing with an alternate test  methodology [phone]) is advised. The SARS-CoV-2 RNA is generally  detectable in upper and lower respiratory sp ecimens during the acute  phase of infection. The expected result is Negative. Fact Sheet for Patients:  StrictlyIdeas.no Fact Sheet for Healthcare Providers: BankingDealers.co.za This test is not yet approved or  cleared by the [place] and has been authorized for detection and/or diagnosis of SARS-CoV-2 by FDA under an Emergency Use Authorization (EUA).  This EUA will remain in effect (meaning this test can be used) for the duration of the COVID-19 declaration under Section 564(b)(1) of the Act, 21 U.S.C. section 360bbb-3(b)(1), unless the authorization is terminated or revoked sooner. Performed at [facility], [place] [address], [place], [place] [address]   Surgical PCR screen     Status: Abnormal   Collection Time: 12/18/18  6:15 AM   Specimen: Nasal Mucosa; Nasal Swab  Result Value Ref Range Status   MRSA, PCR NEGATIVE NEGATIVE Final   Staphylococcus aureus POSITIVE (A) NEGATIVE Final    Comment: (NOTE) The Xpert SA Assay (FDA approved for NASAL specimens in patients 37 years of age and  older), is one component of a comprehensive surveillance program. It is not intended to diagnose infection nor to guide or monitor treatment. Performed at [facility], [place] [address], [place], [place] [address]      Studies: Dg Chest 1 View  Result Date: 12/17/2018 CLINICAL DATA:  Trip and fall with left hip fracture. EXAM: CHEST  1 VIEW COMPARISON:  CT 05/05/2018 FINDINGS: Chronic hyperinflation.The cardiomediastinal contours are normal. Sub solid nodule in the right upper lobe and ground-glass nodule in the left lower lobe on prior CT are not visualized radiographically. Pulmonary vasculature is normal. No consolidation, pleural effusion, or pneumothorax. No acute osseous abnormalities are seen. Remote left posterior rib fractures. IMPRESSION: Chronic hyperinflation without acute abnormality. Electronically Signed   By: Keith Rake M.D.   On: 12/17/2018 23:14   Dg Hip Unilat W Or Wo Pelvis 2-3 Views Left  Result Date: 12/17/2018 CLINICAL DATA:  Post fall with left hip pain and leg shortening. EXAM: DG HIP (WITH OR WITHOUT PELVIS) 2-3V LEFT COMPARISON:  None. FINDINGS: Displaced fracture left femoral neck with proximal migration of the femoral shaft. Femoral head remains seated. No additional fracture of the pelvis. Pubic symphysis and sacroiliac joints are congruent. Pubic rami are intact. IMPRESSION: Displaced left femoral neck fracture. Electronically Signed   By: Keith Rake M.D.   On: 12/17/2018 23:15    Scheduled Meds: . amLODipine  5 mg Oral Daily  . aspirin EC  81 mg Oral Daily  . Chlorhexidine Gluconate Cloth  6 each Topical Daily  . furosemide  20 mg Oral Daily  . losartan  100 mg Oral Daily  . multivitamin  2 tablet Oral Daily  . multivitamin with minerals  1 tablet Oral Daily  . mupirocin ointment  1 application Nasal BID  . povidone-iodine  2 application Topical Once  . povidone-iodine  2 application Topical Once  . vitamin C  500 mg Oral Daily   Continuous  Infusions: . sodium chloride 100 mL/hr at 12/18/18 0100  .  ceFAZolin (ANCEF) IV    . tranexamic acid      Principal Problem:   Closed left femoral fracture (HCC) Active Problems:   Tobacco abuse, smokes pipe   Essential hypertension   BPH with urinary obstruction   Fall   COPD (chronic obstructive pulmonary disease) with emphysema ([place])   Fall at home, initial encounter    Time spent: >35 minutes     Kinnie Feil  [facility] Pager [phone]. If 7PM-7AM, please contact night-coverage at [email or web address], password [facility] 12/18/2018, 12:08 PM  LOS: 1 day

## 2018-12-18 NOTE — Progress Notes (Signed)
Pt's wife [name] called. She will be here tmrw morning around 9 am.

## 2018-12-18 NOTE — Plan of Care (Signed)
?

## 2018-12-18 NOTE — Progress Notes (Signed)
RN attempted to call pt's wife, Coben Godshall, to update on pt status. Unsuccessful at this time. Will continue to monitor.

## 2018-12-18 NOTE — Progress Notes (Signed)
Pre-op report called and given to Sam, RN in Short Stay. All questions answered to satisfaction. Awaiting OR personnel for transportation. Will continue to monitor.

## 2018-12-18 NOTE — Anesthesia Preprocedure Evaluation (Addendum)
Anesthesia Evaluation  Patient identified by MRN, date of birth, ID band Patient confused    Reviewed: Allergy & Precautions, NPO status , Patient's Chart, lab work & pertinent test results  Airway Mallampati: II  TM Distance: >3 FB Neck ROM: Full    Dental no notable dental hx.    Pulmonary COPD, Current Smoker,    Pulmonary exam normal breath sounds clear to auscultation       Cardiovascular hypertension, Normal cardiovascular exam Rhythm:Regular Rate:Normal     Neuro/Psych Dementia negative neurological ROS     GI/Hepatic negative GI ROS, Neg liver ROS,   Endo/Other  negative endocrine ROS  Renal/GU negative Renal ROS  negative genitourinary   Musculoskeletal negative musculoskeletal ROS (+)   Abdominal   Peds negative pediatric ROS (+)  Hematology negative hematology ROS (+)   Anesthesia Other Findings   Reproductive/Obstetrics negative OB ROS                            Anesthesia Physical Anesthesia Plan  ASA: III  Anesthesia Plan: General   Post-op Pain Management:    Induction: Intravenous  PONV Risk Score and Plan: 2 and Ondansetron, Dexamethasone and Treatment may vary due to age or medical condition  Airway Management Planned: Oral ETT  Additional Equipment:   Intra-op Plan:   Post-operative Plan: Extubation in OR  Informed Consent: I have reviewed the patients History and Physical, chart, labs and discussed the procedure including the risks, benefits and alternatives for the proposed anesthesia with the patient or authorized representative who has indicated his/her understanding and acceptance.     Dental advisory given  Plan Discussed with: CRNA and Surgeon  Anesthesia Plan Comments:         Anesthesia Quick Evaluation

## 2018-12-19 ENCOUNTER — Encounter (HOSPITAL_COMMUNITY)
Admission: EM | Disposition: A | Discharge status: Home Health | Payer: Self-pay | Source: Home / Self Care | Attending: Internal Medicine

## 2018-12-19 ENCOUNTER — Inpatient Hospital Stay (HOSPITAL_COMMUNITY): Payer: Medicare Other

## 2018-12-19 ENCOUNTER — Inpatient Hospital Stay (HOSPITAL_COMMUNITY): Payer: Medicare Other | Admitting: Certified Registered"

## 2018-12-19 HISTORY — PX: TOTAL HIP ARTHROPLASTY: SHX124

## 2018-12-19 SURGERY — ARTHROPLASTY, HIP, TOTAL, ANTERIOR APPROACH
Anesthesia: General | Laterality: Left

## 2018-12-19 MED ORDER — DEXAMETHASONE SODIUM PHOSPHATE 10 MG/ML IJ SOLN
INTRAMUSCULAR | Status: DC | PRN
  Administered 2018-12-19: 5 mg via INTRAVENOUS

## 2018-12-19 MED ORDER — ACETAMINOPHEN 325 MG PO TABS: 325.0000 mg | ORAL_TABLET | Freq: Four times a day (QID) | ORAL | Status: DC | PRN

## 2018-12-19 MED ORDER — HALOPERIDOL LACTATE 5 MG/ML IJ SOLN
2.0000 mg | Freq: Once | INTRAMUSCULAR | Status: AC
  Administered 2018-12-19: 2 mg via INTRAVENOUS
  Filled 2018-12-19: qty 1

## 2018-12-19 MED ORDER — PROMETHAZINE HCL 25 MG/ML IJ SOLN: 6.2500 mg | INTRAMUSCULAR | Status: DC | PRN

## 2018-12-19 MED ORDER — SUGAMMADEX SODIUM 200 MG/2ML IV SOLN
INTRAVENOUS | Status: DC | PRN
  Administered 2018-12-19: 118.8 mg via INTRAVENOUS

## 2018-12-19 MED ORDER — BUPIVACAINE-EPINEPHRINE (PF) 0.5% -1:200000 IJ SOLN
INTRAMUSCULAR | Status: DC | PRN
  Administered 2018-12-19: 30 mL

## 2018-12-19 MED ORDER — CEFAZOLIN SODIUM-DEXTROSE 2-4 GM/100ML-% IV SOLN
INTRAVENOUS | Status: AC
  Filled 2018-12-19: qty 100

## 2018-12-19 MED ORDER — EPHEDRINE SULFATE 50 MG/ML IJ SOLN
INTRAMUSCULAR | Status: DC | PRN
  Administered 2018-12-19: 10 mg via INTRAVENOUS
  Administered 2018-12-19: 5 mg via INTRAVENOUS

## 2018-12-19 MED ORDER — MORPHINE SULFATE (PF) 2 MG/ML IV SOLN: 0.5000 mg | INTRAVENOUS | Status: DC | PRN

## 2018-12-19 MED ORDER — SODIUM CHLORIDE 0.9 % IV SOLN
INTRAVENOUS | Status: DC | PRN
  Administered 2018-12-19: 12:00:00 25 ug/min via INTRAVENOUS
  Administered 2018-12-19: 50 ug/min via INTRAVENOUS

## 2018-12-19 MED ORDER — ROCURONIUM BROMIDE 10 MG/ML (PF) SYRINGE
PREFILLED_SYRINGE | INTRAVENOUS | Status: AC
  Filled 2018-12-19: qty 10

## 2018-12-19 MED ORDER — ONDANSETRON HCL 4 MG/2ML IJ SOLN
INTRAMUSCULAR | Status: AC
  Filled 2018-12-19: qty 2

## 2018-12-19 MED ORDER — ROCURONIUM BROMIDE 10 MG/ML (PF) SYRINGE
PREFILLED_SYRINGE | INTRAVENOUS | Status: DC | PRN
  Administered 2018-12-19: 50 mg via INTRAVENOUS

## 2018-12-19 MED ORDER — METOCLOPRAMIDE HCL 5 MG/ML IJ SOLN: 5.0000 mg | Freq: Three times a day (TID) | INTRAMUSCULAR | Status: DC | PRN

## 2018-12-19 MED ORDER — MIDAZOLAM HCL 2 MG/2ML IJ SOLN
INTRAMUSCULAR | Status: DC | PRN
  Administered 2018-12-19: 2 mg via INTRAVENOUS

## 2018-12-19 MED ORDER — TRANEXAMIC ACID-NACL 1000-0.7 MG/100ML-% IV SOLN
INTRAVENOUS | Status: AC
  Filled 2018-12-19: qty 100

## 2018-12-19 MED ORDER — PROPOFOL 10 MG/ML IV BOLUS
INTRAVENOUS | Status: DC | PRN
  Administered 2018-12-19: 100 mg via INTRAVENOUS

## 2018-12-19 MED ORDER — LACTATED RINGERS IV SOLN
INTRAVENOUS | Status: DC
  Administered 2018-12-19 (×2): via INTRAVENOUS

## 2018-12-19 MED ORDER — LIDOCAINE 2% (20 MG/ML) 5 ML SYRINGE
INTRAMUSCULAR | Status: AC
  Filled 2018-12-19: qty 5

## 2018-12-19 MED ORDER — CEFAZOLIN SODIUM-DEXTROSE 2-3 GM-%(50ML) IV SOLR
INTRAVENOUS | Status: DC | PRN
  Administered 2018-12-19: 2 g via INTRAVENOUS

## 2018-12-19 MED ORDER — ONDANSETRON HCL 4 MG/2ML IJ SOLN
INTRAMUSCULAR | Status: DC | PRN
  Administered 2018-12-19: 4 mg via INTRAVENOUS

## 2018-12-19 MED ORDER — HYDROCODONE-ACETAMINOPHEN 5-325 MG PO TABS
1.0000 | ORAL_TABLET | ORAL | Status: DC | PRN
  Administered 2018-12-19: 2 via ORAL
  Filled 2018-12-19: qty 2

## 2018-12-19 MED ORDER — KETOROLAC TROMETHAMINE 30 MG/ML IJ SOLN
INTRAMUSCULAR | Status: AC
  Filled 2018-12-19: qty 1

## 2018-12-19 MED ORDER — EPHEDRINE 5 MG/ML INJ
INTRAVENOUS | Status: AC
  Filled 2018-12-19: qty 10

## 2018-12-19 MED ORDER — ONDANSETRON HCL 4 MG PO TABS: 4.0000 mg | ORAL_TABLET | Freq: Four times a day (QID) | ORAL | Status: DC | PRN

## 2018-12-19 MED ORDER — SUCCINYLCHOLINE CHLORIDE 200 MG/10ML IV SOSY
PREFILLED_SYRINGE | INTRAVENOUS | Status: AC
  Filled 2018-12-19: qty 10

## 2018-12-19 MED ORDER — 0.9 % SODIUM CHLORIDE (POUR BTL) OPTIME
TOPICAL | Status: DC | PRN
  Administered 2018-12-19: 1000 mL

## 2018-12-19 MED ORDER — TRANEXAMIC ACID-NACL 1000-0.7 MG/100ML-% IV SOLN
INTRAVENOUS | Status: DC | PRN
  Administered 2018-12-19: 1000 mg via INTRAVENOUS

## 2018-12-19 MED ORDER — PROPOFOL 10 MG/ML IV BOLUS
INTRAVENOUS | Status: AC
  Filled 2018-12-19: qty 20

## 2018-12-19 MED ORDER — METOCLOPRAMIDE HCL 5 MG PO TABS: 5.0000 mg | ORAL_TABLET | Freq: Three times a day (TID) | ORAL | Status: DC | PRN

## 2018-12-19 MED ORDER — OXYCODONE HCL 5 MG/5ML PO SOLN: 5.0000 mg | Freq: Once | ORAL | Status: DC | PRN

## 2018-12-19 MED ORDER — SODIUM CHLORIDE (PF) 0.9 % IJ SOLN
INTRAMUSCULAR | Status: DC | PRN
  Administered 2018-12-19: 30 mL

## 2018-12-19 MED ORDER — SODIUM CHLORIDE 0.9 % IV SOLN
INTRAVENOUS | Status: AC | PRN
  Administered 2018-12-19: 1000 mL

## 2018-12-19 MED ORDER — SODIUM CHLORIDE 0.9 % IR SOLN
Status: DC | PRN
  Administered 2018-12-19: 3000 mL

## 2018-12-19 MED ORDER — FENTANYL CITRATE (PF) 250 MCG/5ML IJ SOLN
INTRAMUSCULAR | Status: AC
  Filled 2018-12-19: qty 5

## 2018-12-19 MED ORDER — PHENOL 1.4 % MT LIQD: 1.0000 | OROMUCOSAL | Status: DC | PRN

## 2018-12-19 MED ORDER — PHENYLEPHRINE 40 MCG/ML (10ML) SYRINGE FOR IV PUSH (FOR BLOOD PRESSURE SUPPORT)
PREFILLED_SYRINGE | INTRAVENOUS | Status: AC
  Filled 2018-12-19: qty 10

## 2018-12-19 MED ORDER — CELECOXIB 200 MG PO CAPS
200.0000 mg | ORAL_CAPSULE | Freq: Two times a day (BID) | ORAL | Status: DC
  Administered 2018-12-19 – 2018-12-20 (×2): 200 mg via ORAL
  Filled 2018-12-19 (×2): qty 1

## 2018-12-19 MED ORDER — FENTANYL CITRATE (PF) 100 MCG/2ML IJ SOLN
INTRAMUSCULAR | Status: DC | PRN
  Administered 2018-12-19: 25 ug via INTRAVENOUS
  Administered 2018-12-19: 50 ug via INTRAVENOUS
  Administered 2018-12-19: 25 ug via INTRAVENOUS
  Administered 2018-12-19: 50 ug via INTRAVENOUS

## 2018-12-19 MED ORDER — DEXAMETHASONE SODIUM PHOSPHATE 10 MG/ML IJ SOLN
INTRAMUSCULAR | Status: AC
  Filled 2018-12-19: qty 1

## 2018-12-19 MED ORDER — CEFAZOLIN SODIUM-DEXTROSE 2-4 GM/100ML-% IV SOLN
2.0000 g | Freq: Four times a day (QID) | INTRAVENOUS | Status: AC
  Administered 2018-12-19 (×2): 2 g via INTRAVENOUS
  Filled 2018-12-19 (×2): qty 100

## 2018-12-19 MED ORDER — HYDROMORPHONE HCL 1 MG/ML IJ SOLN: 0.2500 mg | INTRAMUSCULAR | Status: DC | PRN

## 2018-12-19 MED ORDER — DOCUSATE SODIUM 100 MG PO CAPS
100.0000 mg | ORAL_CAPSULE | Freq: Two times a day (BID) | ORAL | Status: DC
  Administered 2018-12-19 – 2018-12-20 (×2): 100 mg via ORAL
  Filled 2018-12-19 (×2): qty 1

## 2018-12-19 MED ORDER — SENNA 8.6 MG PO TABS
1.0000 | ORAL_TABLET | Freq: Two times a day (BID) | ORAL | Status: DC
  Administered 2018-12-19 – 2018-12-20 (×2): 8.6 mg via ORAL
  Filled 2018-12-19 (×2): qty 1

## 2018-12-19 MED ORDER — ONDANSETRON HCL 4 MG/2ML IJ SOLN: 4.0000 mg | Freq: Four times a day (QID) | INTRAMUSCULAR | Status: DC | PRN

## 2018-12-19 MED ORDER — MENTHOL 3 MG MT LOZG: 1.0000 | LOZENGE | OROMUCOSAL | Status: DC | PRN

## 2018-12-19 MED ORDER — BUPIVACAINE-EPINEPHRINE (PF) 0.5% -1:200000 IJ SOLN
INTRAMUSCULAR | Status: AC
  Filled 2018-12-19: qty 30

## 2018-12-19 MED ORDER — ASPIRIN EC 81 MG PO TBEC
81.0000 mg | DELAYED_RELEASE_TABLET | Freq: Two times a day (BID) | ORAL | Status: DC
  Administered 2018-12-20: 81 mg via ORAL
  Filled 2018-12-19: qty 1

## 2018-12-19 MED ORDER — LIDOCAINE 2% (20 MG/ML) 5 ML SYRINGE
INTRAMUSCULAR | Status: DC | PRN
  Administered 2018-12-19: 60 mg via INTRAVENOUS

## 2018-12-19 MED ORDER — MIDAZOLAM HCL 2 MG/2ML IJ SOLN
INTRAMUSCULAR | Status: AC
  Filled 2018-12-19: qty 2

## 2018-12-19 MED ORDER — KETOROLAC TROMETHAMINE 30 MG/ML IJ SOLN
INTRAMUSCULAR | Status: DC | PRN
  Administered 2018-12-19: 30 mg via INTRAVENOUS

## 2018-12-19 MED ORDER — PHENYLEPHRINE 40 MCG/ML (10ML) SYRINGE FOR IV PUSH (FOR BLOOD PRESSURE SUPPORT)
PREFILLED_SYRINGE | INTRAVENOUS | Status: DC | PRN
  Administered 2018-12-19 (×2): 80 ug via INTRAVENOUS

## 2018-12-19 MED ORDER — OXYCODONE HCL 5 MG PO TABS: 5.0000 mg | ORAL_TABLET | Freq: Once | ORAL | Status: DC | PRN

## 2018-12-19 SURGICAL SUPPLY — 54 items
ACETAB CUP W/GRIPTION 54 (Plate) ×2 IMPLANT
ADH SKN CLS APL DERMABOND .7 (GAUZE/BANDAGES/DRESSINGS) ×1
APL PRP STRL LF DISP 70% ISPRP (MISCELLANEOUS)
CHLORAPREP W/TINT 26 (MISCELLANEOUS) ×1 IMPLANT
COVER WAND RF STERILE (DRAPES) ×1 IMPLANT
CUP ACETAB W/GRIPTION 54 (Plate) IMPLANT
DERMABOND ADVANCED (GAUZE/BANDAGES/DRESSINGS) ×1
DERMABOND ADVANCED .7 DNX12 (GAUZE/BANDAGES/DRESSINGS) IMPLANT
DRSG AQUACEL AG ADV 3.5X10 (GAUZE/BANDAGES/DRESSINGS) ×2 IMPLANT
ELECT BLADE 4.0 EZ CLEAN MEGAD (MISCELLANEOUS)
ELECT PENCIL ROCKER SW 15FT (MISCELLANEOUS) ×1 IMPLANT
ELECT REM PT RETURN 9FT ADLT (ELECTROSURGICAL)
ELECTRODE BLDE 4.0 EZ CLN MEGD (MISCELLANEOUS) ×1 IMPLANT
ELECTRODE REM PT RTRN 9FT ADLT (ELECTROSURGICAL) ×1 IMPLANT
EVACUATOR 1/8 PVC DRAIN (DRAIN) IMPLANT
GLOVE BIO SURGEON STRL SZ8.5 (GLOVE) ×2 IMPLANT
GLOVE BIOGEL PI IND STRL 8.5 (GLOVE) ×1 IMPLANT
GLOVE BIOGEL PI INDICATOR 8.5 (GLOVE)
GOWN STRL REUS W/ TWL LRG LVL3 (GOWN DISPOSABLE) ×2 IMPLANT
GOWN STRL REUS W/TWL 2XL LVL3 (GOWN DISPOSABLE) ×1 IMPLANT
GOWN STRL REUS W/TWL LRG LVL3 (GOWN DISPOSABLE)
HANDPIECE INTERPULSE COAX TIP (DISPOSABLE)
HEAD CERAMIC 36 PLUS5 (Hips) ×1 IMPLANT
HOOD PEEL AWAY FACE SHEILD DIS (HOOD) ×2 IMPLANT
KIT BASIN OR (CUSTOM PROCEDURE TRAY) ×1 IMPLANT
KIT TURNOVER KIT B (KITS) ×1 IMPLANT
LINER NEUTRAL 54X36MM PLUS 4 (Hips) ×1 IMPLANT
MANIFOLD NEPTUNE II (INSTRUMENTS) ×1 IMPLANT
MARKER SKIN DUAL TIP RULER LAB (MISCELLANEOUS) ×2 IMPLANT
NDL SPNL 18GX3.5 QUINCKE PK (NEEDLE) ×1 IMPLANT
NEEDLE SPNL 18GX3.5 QUINCKE PK (NEEDLE) IMPLANT
NS IRRIG 1000ML POUR BTL (IV SOLUTION) ×1 IMPLANT
PACK TOTAL JOINT (CUSTOM PROCEDURE TRAY) ×1 IMPLANT
PACK UNIVERSAL I (CUSTOM PROCEDURE TRAY) ×1 IMPLANT
SAW OSC TIP CART 19.5X105X1.3 (SAW) ×1 IMPLANT
SEALER BIPOLAR AQUA 6.0 (INSTRUMENTS) ×1 IMPLANT
SET HNDPC FAN SPRY TIP SCT (DISPOSABLE) ×1 IMPLANT
SOL PREP POV-IOD 4OZ 10% (MISCELLANEOUS) ×1 IMPLANT
STEM TRI LOC BPS GRIPTON SZ 10 IMPLANT
SUT ETHIBOND NAB CT1 #1 30IN (SUTURE) ×2 IMPLANT
SUT MNCRL AB 3-0 PS2 18 (SUTURE) ×1 IMPLANT
SUT MON AB 2-0 CT1 36 (SUTURE) ×1 IMPLANT
SUT VIC AB 1 CT1 27 (SUTURE)
SUT VIC AB 1 CT1 27XBRD ANBCTR (SUTURE) ×1 IMPLANT
SUT VIC AB 2-0 CT1 27 (SUTURE)
SUT VIC AB 2-0 CT1 TAPERPNT 27 (SUTURE) ×1 IMPLANT
SUT VLOC 180 0 24IN GS25 (SUTURE) ×1 IMPLANT
SYR 50ML LL SCALE MARK (SYRINGE) ×1 IMPLANT
TOWEL GREEN STERILE (TOWEL DISPOSABLE) ×1 IMPLANT
TOWEL GREEN STERILE FF (TOWEL DISPOSABLE) ×1 IMPLANT
TRAY CATH 16FR W/PLASTIC CATH (SET/KITS/TRAYS/PACK) IMPLANT
TRAY FOLEY CATH SILVER 16FR (SET/KITS/TRAYS/PACK) IMPLANT
TRI LOC BPS W GRIPTON SZ 10 ×2 IMPLANT
WATER STERILE IRR 1000ML POUR (IV SOLUTION) ×3 IMPLANT

## 2018-12-19 NOTE — Progress Notes (Signed)
[facility] PROGRESS NOTE  Hunter Boone WFU:[id number] DOB: 04/04/1946 DOA: 12/17/2018 PCP: Binnie Rail, MD  Brief summary   Hunter Boone is a 73 y.o. male with medical history significant of hypertension, diverticulosis, BPH, tobacco use, previous fall was traumatic pneumothorax, prediabetes, macular degeneration, lung nodules and recurrent falls in the past who came to the ER after sustaining mechanical fall at home.  Patient is a former Web designer and said he used some techniques he learned years ago to try to break the fall but he still fell on his left buttocks and came in sustaining a left closed femoral neck fracture.  He is having pain is 6 out of 10.  Denied hitting his head.  Patient was fully aware of falling.  Denied any other injury.  Patient has other medical problems as such we admitting the patient for traumatic fracture and orthopedic will follow.  He has no known cardiac issues.  Patient will need medical clearance prior to surgery.   Assessment/Plan:  Fall. Mechanical. Left closed femoral neck fracture:  Initially, scheduled for surgery on 7/6. Patient refused, noted to have delirium. Today, patient is agreeable for surgery and his wife is coming preop today.  Cont per ortho. DVT post op prophylaxis , pain control. Pt/ot. Cont monitor   COPD: No exacerbation. Chronic tobacco  Use. counseled to stop smoking. Will use prn bronchodilators   Hypertension: Resume home blood pressure medications and titrate as necessary.  History of BPH: Continue home regimen.  No urinary symptoms at the moment.  Leukocytosis . ? Stress induced. Afebrile. No s/s of systemic infection. CXR: no clear infiltrates. [place]: unremarkable. covid 19-negative. Recheck CBC AM. monitor   Delirium. Reportedly, ongoing some memory issues at home per family. Suspect possibly underlying mild cognitive impairment. CT head: no acute infarcts. Neuro exam is non focal, no s/s of systemic infection.  Patient/family denies excessive alcohol use. Cont monitor, avoid benadryl benzo if possible. At risk for post op deliquium   Code Status: full Family Communication: d/w patient, Therapist, sports. Recently d/w his family(indicate person spoken with, relationship, and if by phone, the number) Disposition Plan: pend surgery. Likely rehab    Consultants:  Ortho   Procedures:  surgery on 7/6  Antibiotics: Anti-infectives (From admission, onward)   Start     Dose/Rate Route Frequency Ordered Stop   12/18/18 0830  ceFAZolin (ANCEF) IVPB 2g/100 mL premix  Status:  Discontinued     2 g 200 mL/hr over 30 Minutes Intravenous On call to O.R. 12/18/18 0815 12/18/18 1918       (indicate start date, and stop date if known)  HPI/Subjective: Pt got confused and refused surgery yesterday. D/w his wife: reportedly, patient with some memory loss issues at baseline. Possible delirium on top of mild cognitive impairment. His wife is coming to preop to help with consent. Scheduled for surgery today PM -today: he is alert, oriented to person, place. No distress. No acute pains, non focal exam. CT head: no acute infarcts   Objective: Vitals:   12/19/18 0625 12/19/18 0817  BP: (!) 148/72 (!) 144/72  Pulse: 61 60  Resp: 14 16  Temp: 97.8 F (36.6 C)   SpO2: 97% 99%    Intake/Output Summary (Last 24 hours) at 12/19/2018 1050 Last data filed at 12/19/2018 0900 Gross per 24 hour  Intake 420 ml  Output 800 ml  Net -380 ml   Filed Weights   12/17/18 2142  Weight: 59.4 kg    Exam:  General:  No distress   Cardiovascular: s1,s2 rrr  Respiratory: CTA BL   Abdomen: soft, nt   Musculoskeletal: no pedal edema    Data Reviewed: Basic Metabolic Panel: Recent Labs  Lab 12/17/18 2232 12/18/18 0433  NA 138 137  K 3.8 4.1  CL 105 105  CO2 25 22  GLUCOSE 114* 140*  BUN 19 18  CREATININE 1.17 0.92  CALCIUM 8.8* 8.9   Liver Function Tests: Recent Labs  Lab 12/17/18 2232 12/18/18 0433  AST 19 18   ALT 15 18  ALKPHOS 49 57  BILITOT 0.7 0.8  PROT 6.2* 6.3*  ALBUMIN 3.5 3.5   No results for input(s): LIPASE, AMYLASE in the last 168 hours. No results for input(s): AMMONIA in the last 168 hours. CBC: Recent Labs  Lab 12/17/18 2232 12/18/18 0433  WBC 10.8* 15.1*  NEUTROABS 7.4  --   HGB 11.8* 12.3*  HCT 35.2* 36.1*  MCV 96.4 94.3  PLT 240 250   Cardiac Enzymes: No results for input(s): CKTOTAL, CKMB, CKMBINDEX, TROPONINI in the last 168 hours. BNP (last 3 results) No results for input(s): BNP in the last 8760 hours.  ProBNP (last 3 results) No results for input(s): PROBNP in the last 8760 hours.  CBG: No results for input(s): GLUCAP in the last 168 hours.  Recent Results (from the past 240 hour(s))  SARS Coronavirus 2 (CEPHEID - Performed in [place] hospital lab), Hosp Order     Status: None   Collection Time: 12/17/18 11:22 PM   Specimen: Nasopharyngeal Swab  Result Value Ref Range Status   SARS Coronavirus 2 NEGATIVE NEGATIVE Final    Comment: (NOTE) If result is NEGATIVE SARS-CoV-2 target nucleic acids are NOT DETECTED. The SARS-CoV-2 RNA is generally detectable in upper and lower  respiratory specimens during the acute phase of infection. The lowest  concentration of SARS-CoV-2 viral copies this assay can detect is 250  copies / mL. A negative result does not preclude SARS-CoV-2 infection  and should not be used as the sole basis for treatment or other  patient management decisions.  A negative result may occur with  improper specimen collection / handling, submission of specimen other  than nasopharyngeal swab, presence of viral mutation(s) within the  areas targeted by this assay, and inadequate number of viral copies  (<250 copies / mL). A negative result must be combined with clinical  observations, patient history, and epidemiological information. If result is POSITIVE SARS-CoV-2 target nucleic acids are DETECTED. The SARS-CoV-2 RNA is generally  detectable in upper and lower  respiratory specimens dur ing the acute phase of infection.  Positive  results are indicative of active infection with SARS-CoV-2.  Clinical  correlation with patient history and other diagnostic information is  necessary to determine patient infection status.  Positive results do  not rule out bacterial infection or co-infection with other viruses. If result is PRESUMPTIVE POSTIVE SARS-CoV-2 nucleic acids MAY BE PRESENT.   A presumptive positive result was obtained on the submitted specimen  and confirmed on repeat testing.  While 2019 novel coronavirus  (SARS-CoV-2) nucleic acids may be present in the submitted sample  additional confirmatory testing may be necessary for epidemiological  and / or clinical management purposes  to differentiate between  SARS-CoV-2 and other Sarbecovirus currently known to infect humans.  If clinically indicated additional testing with an alternate test  methodology [phone]) is advised. The SARS-CoV-2 RNA is generally  detectable in upper and lower respiratory sp ecimens during the  acute  phase of infection. The expected result is Negative. Fact Sheet for Patients:  StrictlyIdeas.no Fact Sheet for Healthcare Providers: BankingDealers.co.za This test is not yet approved or cleared by the [place] FDA and has been authorized for detection and/or diagnosis of SARS-CoV-2 by FDA under an Emergency Use Authorization (EUA).  This EUA will remain in effect (meaning this test can be used) for the duration of the COVID-19 declaration under Section 564(b)(1) of the Act, 21 U.S.C. section 360bbb-3(b)(1), unless the authorization is terminated or revoked sooner. Performed at [facility], [place] [address], [place], [place] [address]   Surgical PCR screen     Status: Abnormal   Collection Time: 12/18/18  6:15 AM   Specimen: Nasal Mucosa; Nasal Swab  Result Value Ref Range  Status   MRSA, PCR NEGATIVE NEGATIVE Final   Staphylococcus aureus POSITIVE (A) NEGATIVE Final    Comment: (NOTE) The Xpert SA Assay (FDA approved for NASAL specimens in patients 25 years of age and older), is one component of a comprehensive surveillance program. It is not intended to diagnose infection nor to guide or monitor treatment. Performed at [facility], [place] [address] [place], [place] [address]      Studies: Dg Chest 1 View  Result Date: 12/17/2018 CLINICAL DATA:  Trip and fall with left hip fracture. EXAM: CHEST  1 VIEW COMPARISON:  CT 05/05/2018 FINDINGS: Chronic hyperinflation.The cardiomediastinal contours are normal. Sub solid nodule in the right upper lobe and ground-glass nodule in the left lower lobe on prior CT are not visualized radiographically. Pulmonary vasculature is normal. No consolidation, pleural effusion, or pneumothorax. No acute osseous abnormalities are seen. Remote left posterior rib fractures. IMPRESSION: Chronic hyperinflation without acute abnormality. Electronically Signed   By: Keith Rake M.D.   On: 12/17/2018 23:14   Ct Head Wo Contrast  Result Date: 12/18/2018 CLINICAL DATA:  New onset confusion EXAM: CT HEAD WITHOUT CONTRAST TECHNIQUE: Contiguous axial images were obtained from the base of the skull through the vertex without intravenous contrast. COMPARISON:  None. FINDINGS: Brain: Mild atrophic changes are seen. No findings to suggest acute hemorrhage, acute infarction or space-occupying mass lesion are seen. Mild chronic white matter ischemic change is noted. Vascular: No hyperdense vessel or unexpected calcification. Skull: Normal. Negative for fracture or focal lesion. Sinuses/Orbits: Mucosal thickening is noted within the frontal, ethmoidal and right maxillary antra. Mucosal retention cysts are noted within the maxillary antra bilaterally. Other: None. IMPRESSION: Chronic atrophic and ischemic changes without acute abnormality.  Electronically Signed   By: Inez Catalina M.D.   On: 12/18/2018 19:56   Dg Hip Unilat W Or Wo Pelvis 2-3 Views Left  Result Date: 12/17/2018 CLINICAL DATA:  Post fall with left hip pain and leg shortening. EXAM: DG HIP (WITH OR WITHOUT PELVIS) 2-3V LEFT COMPARISON:  None. FINDINGS: Displaced fracture left femoral neck with proximal migration of the femoral shaft. Femoral head remains seated. No additional fracture of the pelvis. Pubic symphysis and sacroiliac joints are congruent. Pubic rami are intact. IMPRESSION: Displaced left femoral neck fracture. Electronically Signed   By: Keith Rake M.D.   On: 12/17/2018 23:15    Scheduled Meds: . amLODipine  5 mg Oral Daily  . aspirin EC  81 mg Oral Daily  . Chlorhexidine Gluconate Cloth  6 each Topical Daily  . furosemide  20 mg Oral Daily  . losartan  100 mg Oral Daily  . multivitamin  2 tablet Oral Daily  . multivitamin with  minerals  1 tablet Oral Daily  . mupirocin ointment  1 application Nasal BID  . vitamin C  500 mg Oral Daily   Continuous Infusions: . sodium chloride 100 mL/hr at 12/19/18 0945  . lactated ringers 10 mL/hr at 12/18/18 1614    Principal Problem:   Closed left femoral fracture (HCC) Active Problems:   Tobacco abuse, smokes pipe   Essential hypertension   BPH with urinary obstruction   Fall   COPD (chronic obstructive pulmonary disease) with emphysema ([place])   Fall at home, initial encounter    Time spent: >35 minutes     Kinnie Feil  [facility] Pager [phone]. If 7PM-7AM, please contact night-coverage at [email or web address], password [facility] 12/19/2018, 10:50 AM  LOS: 2 days

## 2018-12-19 NOTE — Plan of Care (Signed)
  Problem: Pain Managment: Goal: General experience of comfort will improve Outcome: Progressing   Problem: Safety: Goal: Ability to remain free from injury will improve Outcome: Progressing   

## 2018-12-19 NOTE — Anesthesia Procedure Notes (Signed)
Procedure Name: Intubation Date/Time: 12/19/2018 12:34 PM Performed by: Kathryne Hitch, CRNA Pre-anesthesia Checklist: Patient identified, Emergency Drugs available, Suction available and Patient being monitored Patient Re-evaluated:Patient Re-evaluated prior to induction Oxygen Delivery Method: Circle system utilized Preoxygenation: Pre-oxygenation with 100% oxygen Induction Type: IV induction Laryngoscope Size: Miller and 2 Grade View: Grade I Tube type: Oral Tube size: 7.5 mm Number of attempts: 1 Airway Equipment and Method: Stylet and Oral airway Placement Confirmation: ETT inserted through vocal cords under direct vision,  positive ETCO2 and breath sounds checked- equal and bilateral Secured at: 23 cm Tube secured with: Tape Dental Injury: Teeth and Oropharynx as per pre-operative assessment

## 2018-12-19 NOTE — Discharge Instructions (Signed)
°Dr. Modell Fendrick °Joint Replacement Specialist °[facility] °[address] °[place], [place] [address] °[phone] ° ° °TOTAL HIP REPLACEMENT POSTOPERATIVE DIRECTIONS ° ° ° °Hip Rehabilitation, Guidelines Following Surgery  ° °WEIGHT BEARING °Weight bearing as tolerated with assist device (walker, cane, etc) as directed, use it as long as suggested by your surgeon or therapist, typically at least 4-6 weeks. ° °The results of a hip operation are greatly improved after range of motion and muscle strengthening exercises. Follow all safety measures which are given to protect your hip. If any of these exercises cause increased pain or swelling in your joint, decrease the amount until you are comfortable again. Then slowly increase the exercises. Call your caregiver if you have problems or questions.  ° °HOME CARE INSTRUCTIONS  °Most of the following instructions are designed to prevent the dislocation of your new hip.  °Remove items at home which could result in a fall. This includes throw rugs or furniture in walking pathways.  °Continue medications as instructed at time of discharge. °· You may have some home medications which will be placed on hold until you complete the course of blood thinner medication. °· You may start showering once you are discharged home. Do not remove your dressing. °Do not put on socks or shoes without following the instructions of your caregivers.   °Sit on chairs with arms. Use the chair arms to help push yourself up when arising.  °Arrange for the use of a toilet seat elevator so you are not sitting low.  °· Walk with walker as instructed.  °You may resume a sexual relationship in one month or when given the OK by your caregiver.  °Use walker as long as suggested by your caregivers.  °You may put full weight on your legs and walk as much as is comfortable. °Avoid periods of inactivity such as sitting longer than an hour when not asleep. This helps prevent  blood clots.  °You may return to work once you are cleared by your surgeon.  °Do not drive a car for 6 weeks or until released by your surgeon.  °Do not drive while taking narcotics.  °Wear elastic stockings for two weeks following surgery during the day but you may remove then at night.  °Make sure you keep all of your appointments after your operation with all of your doctors and caregivers. You should call the office at the above phone number and make an appointment for approximately two weeks after the date of your surgery. °Please pick up a stool softener and laxative for home use as long as you are requiring pain medications. °· ICE to the affected hip every three hours for 30 minutes at a time and then as needed for pain and swelling. Continue to use ice on the hip for pain and swelling from surgery. You may notice swelling that will progress down to the foot and ankle.  This is normal after surgery.  Elevate the leg when you are not up walking on it.   °It is important for you to complete the blood thinner medication as prescribed by your doctor. °· Continue to use the breathing machine which will help keep your temperature down.  It is common for your temperature to cycle up and down following surgery, especially at night when you are not up moving around and exerting yourself.  The breathing machine keeps your lungs expanded and your temperature down. ° °RANGE OF MOTION AND STRENGTHENING EXERCISES  °These exercises are   designed to help you keep full movement of your hip joint. Follow your caregiver's or physical therapist's instructions. Perform all exercises about fifteen times, three times per day or as directed. Exercise both hips, even if you have had only one joint replacement. These exercises can be done on a training (exercise) mat, on the floor, on a table or on a bed. Use whatever works the best and is most comfortable for you. Use music or television while you are exercising so that the exercises  are a pleasant break in your day. This will make your life better with the exercises acting as a break in routine you can look forward to.  °Lying on your back, slowly slide your foot toward your buttocks, raising your knee up off the floor. Then slowly slide your foot back down until your leg is straight again.  °Lying on your back spread your legs as far apart as you can without causing discomfort.  °Lying on your side, raise your upper leg and foot straight up from the floor as far as is comfortable. Slowly lower the leg and repeat.  °Lying on your back, tighten up the muscle in the front of your thigh (quadriceps muscles). You can do this by keeping your leg straight and trying to raise your heel off the floor. This helps strengthen the largest muscle supporting your knee.  °Lying on your back, tighten up the muscles of your buttocks both with the legs straight and with the knee bent at a comfortable angle while keeping your heel on the floor.  ° °SKILLED REHAB INSTRUCTIONS: °If the patient is transferred to a skilled rehab facility following release from the hospital, a list of the current medications will be sent to the facility for the patient to continue.  When discharged from the skilled rehab facility, please have the facility set up the patient's Home Health Physical Therapy prior to being released. Also, the skilled facility will be responsible for providing the patient with their medications at time of release from the facility to include their pain medication and their blood thinner medication. If the patient is still at the rehab facility at time of the two week follow up appointment, the skilled rehab facility will also need to assist the patient in arranging follow up appointment in our office and any transportation needs. ° °MAKE SURE YOU:  °Understand these instructions.  °Will watch your condition.  °Will get help right away if you are not doing well or get worse. ° °Pick up stool softner and  laxative for home use following surgery while on pain medications. °Do not remove your dressing. °The dressing is waterproof--it is OK to take showers. °Continue to use ice for pain and swelling after surgery. °Do not use any lotions or creams on the incision until instructed by your surgeon. °Total Hip Protocol. ° ° °

## 2018-12-19 NOTE — Progress Notes (Signed)
Pt has ambulated to BR and now is OOB, wanting to go outside to smoke. Pts wife called me to his room. She was unsure of how to deal with this behavior and looked a little nervous. Attempted to calm pt down, it took a  Few minutes before he was ready to listen to me. Continue to monitor

## 2018-12-19 NOTE — Transfer of Care (Signed)
Immediate Anesthesia Transfer of Care Note  Patient: HOYT LEANOS  Procedure(s) Performed: TOTAL HIP ARTHROPLASTY ANTERIOR APPROACH (Left )  Patient Location: PACU  Anesthesia Type:General  Level of Consciousness: drowsy and patient cooperative  Airway & Oxygen Therapy: Patient Spontanous Breathing and Patient connected to face mask oxygen  Post-op Assessment: Report given to RN and Post -op Vital signs reviewed and stable  Post vital signs: Reviewed and stable  Last Vitals:  Vitals Value Taken Time  BP 102/61 12/19/18 1404  Temp    Pulse    Resp 28 12/19/18 1407  SpO2    Vitals shown include unvalidated device data.  Last Pain:  Vitals:   12/19/18 0947  TempSrc:   PainSc: 0-No pain         Complications: No apparent anesthesia complications

## 2018-12-19 NOTE — Progress Notes (Signed)
Pt called to surgery

## 2018-12-19 NOTE — Progress Notes (Signed)
Around 2300 last night patient became very confused and agitated. Pt pull his IV out, trying to get OOB. Pain med was given. [facility] was paged. An order for a safety sitter was placed. 0000 Safety sitter at the bedside; IV team placed another IV. Patient is cooperative and less agitated. Will continue to monitor.

## 2018-12-19 NOTE — Progress Notes (Signed)
Pt's bedtime meds are on hold. [facility] was paged. Per MD: "I don't see any onhold orders. All orders are active." Aspirin 81 mg and multivitamins were given to the pt at bedtime.

## 2018-12-19 NOTE — Op Note (Signed)
OPERATIVE REPORT  SURGEON: Rod Can, MD   ASSISTANT: Laure Kidney, RNFA.  PREOPERATIVE DIAGNOSIS: Displaced Left femoral neck fracture.   POSTOPERATIVE DIAGNOSIS: Displaced Left femoral neck fracture.   PROCEDURE: Left total hip arthroplasty, anterior approach.   IMPLANTS: DePuy Tri Lock stem, size 10, hi offset. DePuy Pinnacle Cup, size 54 mm. DePuy Altrx liner, size 36 by 54 mm, +4 neutral. DePuy Biolox ceramic head ball, size 36 + 5 mm.  ANESTHESIA:  General  ESTIMATED BLOOD LOSS: 150 mL    ANTIBIOTICS: 2 g Ancef.  DRAINS: None.  COMPLICATIONS: None.   CONDITION: PACU - hemodynamically stable.   BRIEF CLINICAL NOTE: Hunter Boone is a 73 y.o. male with a displaced Left femoral neck fracture. The patient was admitted to the hospitalist service and underwent perioperative risk stratification and medical optimization. The risks, benefits, and alternatives to total hip arthroplasty were explained, and the patient / wife elected to proceed.  PROCEDURE IN DETAIL: The patient was taken to the operating room and general anesthesia was induced on the hospital bed. The patient was then positioned on the Hana table. All bony prominences were well padded. The hip was prepped and draped in the normal sterile surgical fashion. A time-out was called verifying side and site of surgery. Antibiotics were given within 60 minutes of beginning the procedure.  The direct anterior approach to the hip was performed through the Hueter interval. Lateral femoral circumflex vessels were treated with the Auqumantys. The anterior capsule was exposed and an inverted T capsulotomy was made. Fracture hematoma was encountered and evacuated. The patient was found to have a comminuted Left subcapital femoral neck fracture. I freshened the femoral neck cut with a saw. I removed the femoral neck fragment. A corkscrew was placed into the head and the head was removed. This was passed to the  back table and was measured.  Acetabular exposure was achieved, and the pulvinar and labrum were excised. Sequential reaming of the acetabulum was then performed up to a size 53 mm reamer. A 54 mm cup was then opened and impacted into place at approximately 40 degrees of abduction and 20 degrees of anteversion. The final polyethylene liner was impacted into place and acetabular osteophytes were removed.   I then gained femoral exposure taking care to protect the abductors and greater trochanter. This was performed using standard external rotation, extension, and adduction. The capsule was peeled off the inner aspect of the greater trochanter, taking care to preserve the short external rotators. A cookie cutter was used to enter the femoral canal, and then the femoral canal finder was placed. Sequential broaching was performed up to a size 10. Calcar planer was used on the femoral neck remnant. I placed a hi offset neck and a trial head ball. The hip was reduced. Leg lengths and offset were checked fluoroscopically. The hip was dislocated and trial components were removed. The final implants were placed, and the hip was reduced.  Fluoroscopy was used to confirm component position and leg lengths. At 90 degrees of external rotation and full extension, the hip was stable to an anterior directed force.  The wound was copiously irrigated with normal saline using pulse lavage. Marcaine solution was injected into the periarticular soft tissue. The wound was closed in layers using #1 Vicryl and V-Loc for the fascia, 2-0 Vicryl for the subcutaneous fat, 2-0 Monocryl for the deep dermal layer, 3-0 running Monocryl subcuticular stitch, and Dermabond for the skin. Once the glue was fully dried, an  Aquacell Ag dressing was applied. The patient was transported to the recovery room in stable condition. Sponge, needle, and instrument counts were correct at the end of the case x2. The patient tolerated the  procedure well and there were no known complications.  Please note that a surgical assistant was a medical necessity for this procedure to perform it in a safe and expeditious manner. Assistant was necessary to provide appropriate retraction of vital neurovascular structures, to prevent femoral fracture, and to allow for anatomic placement of the prosthesis.  POSTOP PLAN: Readmit to hospitalist. Weight bear as tolerated left lower extremity with walker. Begin ASA 81 mg PO BID for DVT ppx.  Mobilize with physical therapy. Disposition planning. Return to the office in 2 weeks for routine postoperative care.

## 2018-12-19 NOTE — Interval H&P Note (Signed)
History and Physical Interval Note:  12/19/2018 11:59 AM  Hunter Boone  has presented today for surgery, with the diagnosis of left femoral neck fracture.  The various methods of treatment have been discussed with the patient and family. After consideration of risks, benefits and other options for treatment, the patient has consented to  Procedure(s): TOTAL HIP ARTHROPLASTY ANTERIOR APPROACH (Left) as a surgical intervention.  The patient's history has been reviewed, patient examined, no change in status, stable for surgery.  I have reviewed the patient's chart and labs.  Questions were answered to the patient's satisfaction.     Patient's wife is here. He is calm today without recollection of yesterday's events. They wish to proceed.  Hunter Boone

## 2018-12-20 ENCOUNTER — Encounter (HOSPITAL_COMMUNITY): Payer: Self-pay | Admitting: Orthopedic Surgery

## 2018-12-20 LAB — BASIC METABOLIC PANEL
Anion gap: 8 (ref 5–15)
BUN: 15 mg/dL (ref 8–23)
CO2: 24 mmol/L (ref 22–32)
Calcium: 8.4 mg/dL — ABNORMAL LOW (ref 8.9–10.3)
Chloride: 103 mmol/L (ref 98–111)
Creatinine, Ser: 0.97 mg/dL (ref 0.61–1.24)
GFR calc Af Amer: >60 mL/min (ref >60)
GFR calc non Af Amer: >60 mL/min (ref >60)
Glucose, Bld: 125 mg/dL — ABNORMAL HIGH (ref 70–99)
Potassium: 4.7 mmol/L (ref 3.5–5.1)
Sodium: 135 mmol/L (ref 135–145)

## 2018-12-20 LAB — CBC
HCT: 30.6 % — ABNORMAL LOW (ref 39.0–52.0)
Hemoglobin: 10.5 g/dL — ABNORMAL LOW (ref 13.0–17.0)
MCH: 32.4 pg (ref 26.0–34.0)
MCHC: 34.3 g/dL (ref 30.0–36.0)
MCV: 94.4 fL (ref 80.0–100.0)
Platelets: 207 10*3/uL (ref 150–400)
RBC: 3.24 MIL/uL — ABNORMAL LOW (ref 4.22–5.81)
RDW: 12.9 % (ref 11.5–15.5)
WBC: 16.6 10*3/uL — ABNORMAL HIGH (ref 4.0–10.5)
nRBC: 0 % (ref 0.0–0.2)

## 2018-12-20 MED ORDER — CELECOXIB 200 MG PO CAPS: 200.0000 mg | ORAL_CAPSULE | Freq: Two times a day (BID) | ORAL | 0 refills | Status: AC

## 2018-12-20 MED ORDER — ASPIRIN 81 MG PO TBEC: 81.0000 mg | DELAYED_RELEASE_TABLET | Freq: Two times a day (BID) | ORAL | 0 refills | Status: AC

## 2018-12-20 NOTE — Anesthesia Postprocedure Evaluation (Signed)
Anesthesia Post Note  Patient: Hunter Boone  Procedure(s) Performed: TOTAL HIP ARTHROPLASTY ANTERIOR APPROACH (Left )     Patient location during evaluation: PACU Anesthesia Type: General Level of consciousness: awake and alert Pain management: pain level controlled Vital Signs Assessment: post-procedure vital signs reviewed and stable Respiratory status: spontaneous breathing, nonlabored ventilation, respiratory function stable and patient connected to nasal cannula oxygen Cardiovascular status: blood pressure returned to baseline and stable Postop Assessment: no apparent nausea or vomiting Anesthetic complications: no    Last Vitals:  Vitals:   12/20/18 0437 12/20/18 0820  BP: 105/64 125/66  Pulse: (!) 50 63  Resp: 14 18  Temp: 36.6 C 36.8 C  SpO2: 98% 98%    Last Pain:  Vitals:   12/20/18 0820  TempSrc: Oral  PainSc: 2                  Tiajuana Amass

## 2018-12-20 NOTE — Progress Notes (Signed)
Pt's wife, [name], at bedside requesting evaluation of pt for pt's cognitive status. Eric [place] MD aware. Will continue to monitor.

## 2018-12-20 NOTE — Progress Notes (Signed)
OT to bedside to complete MOCA.  AVS given and reviewed with pt's wife, [name]. Medications reviewed and discussed. All questions answered to satisfaction. Pt's wife verbalized understanding of information given. Pt escorted off the unit via wheelchair by NT.

## 2018-12-20 NOTE — Discharge Summary (Signed)
Physician Discharge Summary  Hunter Boone CBJ:[id number] DOB: [birth year] DOA: 12/17/2018  PCP: Binnie Rail, MD  Admit date: 12/17/2018 Discharge date: 12/20/2018  Admitted From: Home Disposition:  Home  Recommendations for Outpatient Follow-up:  1. Follow up with PCP in 1 week, recommend repeat CBC to assess WBC count 2. Follow-up with orthopedic surgery, Dr. Lyla Glassing in 2 weeks for post-op wound check 3. Recommend PCP refer patient for formal cognitive testing for possible underlying dementia  Home Health: Yes PT/RN Equipment/Devices: Rolling walker  Discharge Condition: Stable CODE STATUS: Full code Diet recommendation: Regular diet  History of present illness:  Hunter Boone a 73 y.o.malewith medical history significant ofhypertension, diverticulosis, BPH, tobacco use, previous fall with traumatic pneumothorax, prediabetes, macular degeneration, lung nodules and recurrent falls in the past who came to the ER after sustaining mechanical fall at home. Patient is a former Web designer and said he used some techniques he learned years ago to try to break the fall but he still fell on his left buttocks and came in sustaining a left closed femoral neck fracture. He is having pain is 6 out of 10. Denied hitting his head. Patient was fully aware of falling. Denied any other injury. Patient has other medical problems as such we admitting the patient for traumatic fracture and orthopedic will follow. He has no known cardiac issues. Patient will need medical clearance prior to surgery.  Hospital course:  Fall, Mechanical Left closed femoral neck fracture Patient presenting after mechanical fall and sustaining a left femoral neck fracture.  Patient underwent left total hip arthroplasty on 12/19/2018 by Dr. Lyla Glassing.  Patient was evaluated by physical therapy with recommendations of home health PT and rolling walker.  Will continue DVT prophylaxis outpatient with aspirin 81 mg p.o. twice  daily. Follow-up with orthopedics in 2 weeks for wound check.  COPD Continued tobacco use disorder Not in acute exacerbation.  Patient was counseled on tobacco cessation.  Continue follow-up with PCP regarding continued education.  Hypertension: Blood pressure well controlled.  Continue home amlodipine, losartan, furosemide.  History of HYW:VPXTGGYI tamsulosin  Leukocytosis Etiology likely stress induced. Afebrile. No s/s of systemic infection. CXR: no clear infiltrates. [place]: unremarkable. covid 19-negative.   Recommend repeat CBC and outpatient follow-up with PCP.  Delirium Reportedly, ongoing some memory issues at home per family. Suspect possibly underlying mild cognitive impairment. CT head: no acute infarcts. Neuro exam is non focal, no s/s of systemic infection. Patient/family denies excessive alcohol use.  Recommend formal cognitive testing outpatient.  Discharge Diagnoses:  Principal Problem:   Closed left femoral fracture (HCC) Active Problems:   Tobacco abuse, smokes pipe   Essential hypertension   BPH with urinary obstruction   Fall   COPD (chronic obstructive pulmonary disease) with emphysema ([place])   Fall at home, initial encounter    Discharge Instructions  Discharge Instructions    Call MD for:  difficulty breathing, headache or visual disturbances   Complete by: As directed    Call MD for:  persistant dizziness or light-headedness   Complete by: As directed    Call MD for:  redness, tenderness, or signs of infection (pain, swelling, redness, odor or green/yellow discharge around incision site)   Complete by: As directed    Call MD for:  severe uncontrolled pain   Complete by: As directed    Call MD for:  temperature >100.4   Complete by: As directed    Diet - low sodium heart healthy   Complete by: As directed  Increase activity slowly   Complete by: As directed      Allergies as of 12/20/2018   No Known Allergies     Medication List    STOP  taking these medications   aspirin 81 MG tablet Replaced by: aspirin 81 MG EC tablet   hydrocortisone-pramoxine 2.5-1 % rectal cream Commonly known as: ANALPRAM-HC   Proctozone-HC 2.5 % rectal cream Generic drug: hydrocortisone   tamsulosin 0.4 MG Caps capsule Commonly known as: FLOMAX   triamcinolone 0.1 % paste Commonly known as: KENALOG     TAKE these medications   amLODipine 5 MG tablet Commonly known as: NORVASC Take 1 tablet (5 mg total) by mouth daily.   aspirin 81 MG EC tablet Take 1 tablet (81 mg total) by mouth 2 (two) times daily with a meal. Replaces: aspirin 81 MG tablet   celecoxib 200 MG capsule Commonly known as: CELEBREX Take 1 capsule (200 mg total) by mouth 2 (two) times daily for 14 days.   CENTRUM PO Take 1 tablet by mouth daily.   PRESERVISION AREDS 2 PO Take 1 capsule by mouth every morning.   fluticasone 50 MCG/ACT nasal spray Commonly known as: FLONASE USE 2 SPRAYS IN BOTH  NOSTRILS DAILY What changed: See the new instructions.   furosemide 20 MG tablet Commonly known as: LASIX TAKE 1 TABLET EACH DAY. What changed: See the new instructions.   losartan 100 MG tablet Commonly known as: COZAAR Take 1 tablet (100 mg total) by mouth daily. -- Office visit needed for further refills   LUTEIN PO Take 1 capsule by mouth daily.   polyvinyl alcohol 1.4 % ophthalmic solution Commonly known as: LIQUIFILM TEARS Place 1 drop into both eyes daily as needed for dry eyes.   Saw Palmetto 450 MG Caps Take 450 mg by mouth daily.   vitamin C 500 MG tablet Commonly known as: ASCORBIC ACID Take 500 mg by mouth daily.            Durable Medical Equipment  (From admission, onward)         Start     Ordered   12/20/18 1100  For home use only DME Walker rolling  Once    Question:  Patient needs a walker to treat with the following condition  Answer:  Hip fracture ([place])   12/20/18 1059         Follow-up Information    Swinteck, Aaron Edelman,  MD. Schedule an appointment as soon as possible for a visit in 2 weeks.   Specialty: Orthopedic Surgery Why: For wound re-check Contact information: [address] [place] [address] [phone]        Binnie Rail, MD. Call in 1 week(s).   Specialty: Internal Medicine Contact information: [place] [address] [phone]          No Known Allergies  Consultations:  [facility]: Dr Lyla Glassing   Procedures/Studies: Dg Chest 1 View  Result Date: 12/17/2018 CLINICAL DATA:  Trip and fall with left hip fracture. EXAM: CHEST  1 VIEW COMPARISON:  CT 05/05/2018 FINDINGS: Chronic hyperinflation.The cardiomediastinal contours are normal. Sub solid nodule in the right upper lobe and ground-glass nodule in the left lower lobe on prior CT are not visualized radiographically. Pulmonary vasculature is normal. No consolidation, pleural effusion, or pneumothorax. No acute osseous abnormalities are seen. Remote left posterior rib fractures. IMPRESSION: Chronic hyperinflation without acute abnormality. Electronically Signed   By: Keith Rake M.D.   On: 12/17/2018  23:14   Ct Head Wo Contrast  Result Date: 12/18/2018 CLINICAL DATA:  New onset confusion EXAM: CT HEAD WITHOUT CONTRAST TECHNIQUE: Contiguous axial images were obtained from the base of the skull through the vertex without intravenous contrast. COMPARISON:  None. FINDINGS: Brain: Mild atrophic changes are seen. No findings to suggest acute hemorrhage, acute infarction or space-occupying mass lesion are seen. Mild chronic white matter ischemic change is noted. Vascular: No hyperdense vessel or unexpected calcification. Skull: Normal. Negative for fracture or focal lesion. Sinuses/Orbits: Mucosal thickening is noted within the frontal, ethmoidal and right maxillary antra. Mucosal retention cysts are noted within the maxillary antra bilaterally. Other: None. IMPRESSION: Chronic atrophic and ischemic changes  without acute abnormality. Electronically Signed   By: Inez Catalina M.D.   On: 12/18/2018 19:56   Pelvis Portable  Result Date: 12/19/2018 CLINICAL DATA:  The patient suffered a subcapital left hip fracture in a fall 12/17/2018. Status post left hip replacement today. EXAM: PORTABLE PELVIS 1-2 VIEWS COMPARISON:  Plain films left hip 12/17/2018 and intraoperative imaging left hip this same day. FINDINGS: Left total hip arthroplasty is identified. The device is located. No fracture. Surgical wound and gas in the soft tissues noted. IMPRESSION: Status post left hip replacement.  No acute finding. Electronically Signed   By: Inge Rise M.D.   On: 12/19/2018 14:43   Dg C-arm 1-60 Min  Result Date: 12/19/2018 CLINICAL DATA:  Status post left hip arthroplasty. EXAM: OPERATIVE LEFT HIP (WITH PELVIS IF PERFORMED) 2 VIEWS TECHNIQUE: Fluoroscopic spot image(s) were submitted for interpretation post-operatively. COMPARISON:  12/17/2018 FINDINGS: New total left hip arthroplasty appears well seated and well aligned on the submitted images. No acute fracture or evidence of an operative complication. IMPRESSION: Well-positioned total left hip arthroplasty. Electronically Signed   By: Lajean Manes M.D.   On: 12/19/2018 13:48   Dg Hip Operative Unilat W Or W/o Pelvis Left  Result Date: 12/19/2018 CLINICAL DATA:  Status post left hip arthroplasty. EXAM: OPERATIVE LEFT HIP (WITH PELVIS IF PERFORMED) 2 VIEWS TECHNIQUE: Fluoroscopic spot image(s) were submitted for interpretation post-operatively. COMPARISON:  12/17/2018 FINDINGS: New total left hip arthroplasty appears well seated and well aligned on the submitted images. No acute fracture or evidence of an operative complication. IMPRESSION: Well-positioned total left hip arthroplasty. Electronically Signed   By: Lajean Manes M.D.   On: 12/19/2018 13:48   Dg Hip Unilat W Or Wo Pelvis 2-3 Views Left  Result Date: 12/17/2018 CLINICAL DATA:  Post fall with left hip  pain and leg shortening. EXAM: DG HIP (WITH OR WITHOUT PELVIS) 2-3V LEFT COMPARISON:  None. FINDINGS: Displaced fracture left femoral neck with proximal migration of the femoral shaft. Femoral head remains seated. No additional fracture of the pelvis. Pubic symphysis and sacroiliac joints are congruent. Pubic rami are intact. IMPRESSION: Displaced left femoral neck fracture. Electronically Signed   By: Keith Rake M.D.   On: 12/17/2018 23:15     Subjective: Patient seen and examined at bedside, resting comfortably.  No complaints.  Evaluated by physical therapy this morning with recommendation of home health therapy.  Family concerned about possible underlying dementia.  Discussed with him to have PCP follow-up and will likely need formal cognitive testing outpatient.  Patient denies headache, no fever/chills/night sweats, no nausea/vomiting/diarrhea, No chest pain, no palpitations, no shortness of breath, no abdominal pain, no weakness, no cough/congestion.  No acute events overnight per nursing staff.   Discharge Exam: Vitals:   12/20/18 0437 12/20/18 0820  BP: 105/64  125/66  Pulse: (!) 50 63  Resp: 14 18  Temp: 97.9 F (36.6 C) 98.3 F (36.8 C)  SpO2: 98% 98%   Vitals:   12/19/18 1517 12/19/18 1910 12/20/18 0437 12/20/18 0820  BP: 129/67 122/80 105/64 125/66  Pulse: 71 74 (!) 50 63  Resp: 16 14 14 18   Temp: 97.8 F (36.6 C) 98.2 F (36.8 C) 97.9 F (36.6 C) 98.3 F (36.8 C)  TempSrc: Oral Oral Oral Oral  SpO2: 99% 97% 98% 98%  Weight:      Height:        General: Pt is alert, awake, not in acute distress Cardiovascular: RRR, S1/S2 +, no rubs, no gallops Respiratory: CTA bilaterally, no wheezing, no rhonchi Abdominal: Soft, NT, ND, bowel sounds + Extremities: no edema, no cyanosis    The results of significant diagnostics from this hospitalization (including imaging, microbiology, ancillary and laboratory) are listed below for reference.     Microbiology: Recent  Results (from the past 240 hour(s))  SARS Coronavirus 2 (CEPHEID - Performed in [place] hospital lab), Hosp Order     Status: None   Collection Time: 12/17/18 11:22 PM   Specimen: Nasopharyngeal Swab  Result Value Ref Range Status   SARS Coronavirus 2 NEGATIVE NEGATIVE Final    Comment: (NOTE) If result is NEGATIVE SARS-CoV-2 target nucleic acids are NOT DETECTED. The SARS-CoV-2 RNA is generally detectable in upper and lower  respiratory specimens during the acute phase of infection. The lowest  concentration of SARS-CoV-2 viral copies this assay can detect is 250  copies / mL. A negative result does not preclude SARS-CoV-2 infection  and should not be used as the sole basis for treatment or other  patient management decisions.  A negative result may occur with  improper specimen collection / handling, submission of specimen other  than nasopharyngeal swab, presence of viral mutation(s) within the  areas targeted by this assay, and inadequate number of viral copies  (<250 copies / mL). A negative result must be combined with clinical  observations, patient history, and epidemiological information. If result is POSITIVE SARS-CoV-2 target nucleic acids are DETECTED. The SARS-CoV-2 RNA is generally detectable in upper and lower  respiratory specimens dur ing the acute phase of infection.  Positive  results are indicative of active infection with SARS-CoV-2.  Clinical  correlation with patient history and other diagnostic information is  necessary to determine patient infection status.  Positive results do  not rule out bacterial infection or co-infection with other viruses. If result is PRESUMPTIVE POSTIVE SARS-CoV-2 nucleic acids MAY BE PRESENT.   A presumptive positive result was obtained on the submitted specimen  and confirmed on repeat testing.  While 2019 novel coronavirus  (SARS-CoV-2) nucleic acids may be present in the submitted sample  additional confirmatory testing may  be necessary for epidemiological  and / or clinical management purposes  to differentiate between  SARS-CoV-2 and other Sarbecovirus currently known to infect humans.  If clinically indicated additional testing with an alternate test  methodology [id number]) is advised. The SARS-CoV-2 RNA is generally  detectable in upper and lower respiratory sp ecimens during the acute  phase of infection. The expected result is Negative. Fact Sheet for Patients:  StrictlyIdeas.no Fact Sheet for Healthcare Providers: BankingDealers.co.za This test is not yet approved or cleared by the [place] FDA and has been authorized for detection and/or diagnosis of SARS-CoV-2 by FDA under an Emergency Use Authorization (EUA).  This EUA will remain in effect (meaning this  test can be used) for the duration of the COVID-19 declaration under Section 564(b)(1) of the Act, 21 U.S.C. section 360bbb-3(b)(1), unless the authorization is terminated or revoked sooner. Performed at [facility], [place] [address], [place], [place] [address]   Surgical PCR screen     Status: Abnormal   Collection Time: 12/18/18  6:15 AM   Specimen: Nasal Mucosa; Nasal Swab  Result Value Ref Range Status   MRSA, PCR NEGATIVE NEGATIVE Final   Staphylococcus aureus POSITIVE (A) NEGATIVE Final    Comment: (NOTE) The Xpert SA Assay (FDA approved for NASAL specimens in patients 74 years of age and older), is one component of a comprehensive surveillance program. It is not intended to diagnose infection nor to guide or monitor treatment. Performed at [facility], [place] [address], [place], [place] [address]      Labs: BNP (last 3 results) No results for input(s): BNP in the last 8760 hours. Basic Metabolic Panel: Recent Labs  Lab 12/17/18 2232 12/18/18 0433 12/20/18 0252  NA 138 137 135  K 3.8 4.1 4.7  CL 105 105 103  CO2 25 22 24   GLUCOSE 114* 140* 125*  BUN 19 18 15    CREATININE 1.17 0.92 0.97  CALCIUM 8.8* 8.9 8.4*   Liver Function Tests: Recent Labs  Lab 12/17/18 2232 12/18/18 0433  AST 19 18  ALT 15 18  ALKPHOS 49 57  BILITOT 0.7 0.8  PROT 6.2* 6.3*  ALBUMIN 3.5 3.5   No results for input(s): LIPASE, AMYLASE in the last 168 hours. No results for input(s): AMMONIA in the last 168 hours. CBC: Recent Labs  Lab 12/17/18 2232 12/18/18 0433 12/20/18 0252  WBC 10.8* 15.1* 16.6*  NEUTROABS 7.4  --   --   HGB 11.8* 12.3* 10.5*  HCT 35.2* 36.1* 30.6*  MCV 96.4 94.3 94.4  PLT 240 250 207   Cardiac Enzymes: No results for input(s): CKTOTAL, CKMB, CKMBINDEX, TROPONINI in the last 168 hours. BNP: Invalid input(s): POCBNP CBG: No results for input(s): GLUCAP in the last 168 hours. D-Dimer No results for input(s): DDIMER in the last 72 hours. Hgb A1c No results for input(s): HGBA1C in the last 72 hours. Lipid Profile No results for input(s): CHOL, HDL, LDLCALC, TRIG, CHOLHDL, LDLDIRECT in the last 72 hours. Thyroid function studies No results for input(s): TSH, T4TOTAL, T3FREE, THYROIDAB in the last 72 hours.  Invalid input(s): FREET3 Anemia work up No results for input(s): VITAMINB12, FOLATE, FERRITIN, TIBC, IRON, RETICCTPCT in the last 72 hours. Urinalysis    Component Value Date/Time   COLORURINE YELLOW 12/18/2018 0756   APPEARANCEUR CLEAR 12/18/2018 0756   LABSPEC 1.019 12/18/2018 0756   PHURINE 5.0 12/18/2018 0756   GLUCOSEU NEGATIVE 12/18/2018 0756   GLUCOSEU NEGATIVE 07/29/2017 1452   HGBUR NEGATIVE 12/18/2018 0756   BILIRUBINUR NEGATIVE 12/18/2018 0756   BILIRUBINUR neg 08/05/2011 1531   KETONESUR NEGATIVE 12/18/2018 0756   PROTEINUR NEGATIVE 12/18/2018 0756   UROBILINOGEN 0.2 07/29/2017 1452   NITRITE NEGATIVE 12/18/2018 0756   LEUKOCYTESUR NEGATIVE 12/18/2018 0756   Sepsis Labs Invalid input(s): PROCALCITONIN,  WBC,  LACTICIDVEN Microbiology Recent Results (from the past 240 hour(s))  SARS Coronavirus 2 (CEPHEID  - Performed in [place] hospital lab), Hosp Order     Status: None   Collection Time: 12/17/18 11:22 PM   Specimen: Nasopharyngeal Swab  Result Value Ref Range Status   SARS Coronavirus 2 NEGATIVE NEGATIVE Final    Comment: (NOTE) If result is NEGATIVE  SARS-CoV-2 target nucleic acids are NOT DETECTED. The SARS-CoV-2 RNA is generally detectable in upper and lower  respiratory specimens during the acute phase of infection. The lowest  concentration of SARS-CoV-2 viral copies this assay can detect is 250  copies / mL. A negative result does not preclude SARS-CoV-2 infection  and should not be used as the sole basis for treatment or other  patient management decisions.  A negative result may occur with  improper specimen collection / handling, submission of specimen other  than nasopharyngeal swab, presence of viral mutation(s) within the  areas targeted by this assay, and inadequate number of viral copies  (<250 copies / mL). A negative result must be combined with clinical  observations, patient history, and epidemiological information. If result is POSITIVE SARS-CoV-2 target nucleic acids are DETECTED. The SARS-CoV-2 RNA is generally detectable in upper and lower  respiratory specimens dur ing the acute phase of infection.  Positive  results are indicative of active infection with SARS-CoV-2.  Clinical  correlation with patient history and other diagnostic information is  necessary to determine patient infection status.  Positive results do  not rule out bacterial infection or co-infection with other viruses. If result is PRESUMPTIVE POSTIVE SARS-CoV-2 nucleic acids MAY BE PRESENT.   A presumptive positive result was obtained on the submitted specimen  and confirmed on repeat testing.  While 2019 novel coronavirus  (SARS-CoV-2) nucleic acids may be present in the submitted sample  additional confirmatory testing may be necessary for epidemiological  and / or clinical management  purposes  to differentiate between  SARS-CoV-2 and other Sarbecovirus currently known to infect humans.  If clinically indicated additional testing with an alternate test  methodology [id number]) is advised. The SARS-CoV-2 RNA is generally  detectable in upper and lower respiratory sp ecimens during the acute  phase of infection. The expected result is Negative. Fact Sheet for Patients:  StrictlyIdeas.no Fact Sheet for Healthcare Providers: BankingDealers.co.za This test is not yet approved or cleared by the [place] FDA and has been authorized for detection and/or diagnosis of SARS-CoV-2 by FDA under an Emergency Use Authorization (EUA).  This EUA will remain in effect (meaning this test can be used) for the duration of the COVID-19 declaration under Section 564(b)(1) of the Act, 21 U.S.C. section 360bbb-3(b)(1), unless the authorization is terminated or revoked sooner. Performed at [facility], [place] [address], [place], [place] [address]   Surgical PCR screen     Status: Abnormal   Collection Time: 12/18/18  6:15 AM   Specimen: Nasal Mucosa; Nasal Swab  Result Value Ref Range Status   MRSA, PCR NEGATIVE NEGATIVE Final   Staphylococcus aureus POSITIVE (A) NEGATIVE Final    Comment: (NOTE) The Xpert SA Assay (FDA approved for NASAL specimens in patients 2 years of age and older), is one component of a comprehensive surveillance program. It is not intended to diagnose infection nor to guide or monitor treatment. Performed at [facility], [place] [address] [place], [place] [address]      Time coordinating discharge: Over 30 minutes  SIGNED:   Shaylyn Bawa J [place] DO  [facility] 12/20/2018, 12:02 PM

## 2018-12-20 NOTE — Evaluation (Signed)
Occupational Therapy Evaluation Patient Details Name: Hunter Boone MRN: [id number] DOB: 1946-01-28 Today's Date: 12/20/2018    History of Present Illness 73 y.o. male with medical history significant of hypertension, diverticulosis, BPH, previous fall was traumatic pneumothorax, prediabetes, macular degeneration, lung nodules and recurrent falls in the past. He came to the ER after sustaining mechanical fall at home. Xray revealed L hip fx. He underwent L THA 12/19/18.   Clinical Impression   PTA Pt independent in ADL and mobility. Today Pt is min guard for transfers with RW, min guard for sink level grooming, and mod A for LB ADL (dressing/bathing) Pt pleasant and cooperative throughout session - but thinking he is in [place], [place] visiting someone's house, no recall of fall or sx.   Today Pt was given the [place]. He scored a 15/30 - in which a score of greater than or equal to 26 is considered normal. This indicates a HIGH deficit.  Visuospatial/executive Pt scored 2/5. He was unable to complete trail making task, He was unable to copy the cube, He was able to draw an appopriate clock, but drew a short hand where the long hand should have been and vise versa. Naming: He received 2/3 points - he called the camel a giraffe, He was given 2 opportunities to call the animal the correct name Attention was a strong section. He scored 5/6 points in this section. He was able to remember and repeat sequences of numbers forwards and backwards, he was able to recognize letters read aloud, and he was able to get 2 correct when counting down from 100 by 7.  Abstraction he received 0/2 points. While he found correlation between the train-bicycle it was that they "both have wheels" and watch-ruler "they both have numbers" which while correct, are not acceptable per MOCA terms.  Delayed Recall the patient received 0 points. He did not even attempt to recall any of the 5 items. "I can't - I've got a horrible  memory" Orientation: He received 2/6 points. He did not know the date, month (although he did grab his phone, flip it open and could tell me then), He did know the year. When asked where he was and what city he was in, he said "I'm visiting a friend in [place], [place]" Later, he did state "wait a minute I'm not in [place], [place] in [place]" but it was after testing was over.   I also HIGHLY stressed to him that he needs to use his RW at [place] right now. He will need HHOT and hopefully progress to OPOT to continue cognitive deficits. An SLP cognitive evaluation should be considered as well.     Follow Up Recommendations  Home health OT;Outpatient OT;Supervision/Assistance - 24 hour    Equipment Recommendations  3 in 1 bedside commode(to be used as a shower chair)    Recommendations for Other Services       Precautions / Restrictions Precautions Precautions: Fall Restrictions Weight Bearing Restrictions: Yes LLE Weight Bearing: Weight bearing as tolerated      Mobility Bed Mobility               General bed mobility comments: OOB in recliner at beginning and end of session  Transfers Overall transfer level: Needs assistance Equipment used: Rolling walker (2 wheeled) Transfers: Sit to/from Stand Sit to Stand: Min guard         General transfer comment: cues for hand placement, min guard for safety    Balance Overall balance assessment:  Needs assistance Sitting-balance support: No upper extremity supported;Feet supported Sitting balance-Leahy Scale: Good     Standing balance support: Bilateral upper extremity supported;During functional activity Standing balance-Leahy Scale: Poor Standing balance comment: reliant on RW                           ADL either performed or assessed with clinical judgement   ADL Overall ADL's : Needs assistance/impaired Eating/Feeding: Modified independent   Grooming: Min guard;Standing;Wash/dry face;Wash/dry hands    Upper Body Bathing: Min guard;Sitting   Lower Body Bathing: Moderate assistance   Upper Body Dressing : Supervision/safety   Lower Body Dressing: Moderate assistance   Toilet Transfer: Min guard;Ambulation;RW Toilet Transfer Details (indicate cue type and reason): requires cues to use RW Toileting- Water quality scientist and Hygiene: Min guard;Sit to/from stand       Functional mobility during ADLs: Min guard;Rolling walker;Cueing for safety       Vision Baseline Vision/History: Wears glasses Wears Glasses: At all times Patient Visual Report: No change from baseline       Perception     Praxis      Pertinent Vitals/Pain Pain Assessment: No/denies pain     Hand Dominance     Extremity/Trunk Assessment Upper Extremity Assessment Upper Extremity Assessment: Overall WFL for tasks assessed   Lower Extremity Assessment Lower Extremity Assessment: Defer to PT evaluation   Cervical / Trunk Assessment Cervical / Trunk Assessment: Kyphotic   Communication Communication Communication: No difficulties   Cognition Arousal/Alertness: Awake/alert Behavior During Therapy: WFL for tasks assessed/performed Overall Cognitive Status: Impaired/Different from baseline Area of Impairment: Orientation;Memory;Safety/judgement;Problem solving                 Orientation Level: Disoriented to;Time;Situation;Place([place], friends house, visiting)   Memory: Decreased short-term memory   Safety/Judgement: Decreased awareness of safety;Decreased awareness of deficits   Problem Solving: Difficulty sequencing;Requires verbal cues General Comments: Scored 15/30 on the [place] Comments  Wife and Sitter present throughout session    Exercises     Shoulder Instructions      Home Living Family/patient expects to be discharged to:: Private residence Living Arrangements: Spouse/significant other Available Help at Discharge: Family;Available 24 hours/day Type of Home:  House Home Access: Stairs to enter [employer] of Steps: 5 Entrance Stairs-Rails: None Home Layout: One level     Bathroom Shower/Tub: Teacher, early years/pre: Handicapped height     Home Equipment: None          Prior Functioning/Environment Level of Independence: Independent                 OT Problem List: Decreased range of motion;Impaired balance (sitting and/or standing);Decreased cognition;Decreased safety awareness;Decreased knowledge of use of DME or AE      OT Treatment/Interventions: Self-care/ADL training;DME and/or AE instruction;Therapeutic activities;Cognitive remediation/compensation;Patient/family education;Balance training    OT Goals(Current goals can be found in the care plan section) Acute Rehab OT Goals Patient Stated Goal: to get back home OT Goal Formulation: With patient/family Time For Goal Achievement: 01/03/19 Potential to Achieve Goals: Good ADL Goals Pt Will Perform Grooming: with modified independence;standing Pt Will Perform Lower Body Bathing: with modified independence;sit to/from stand Pt Will Perform Lower Body Dressing: with supervision;sit to/from stand Pt Will Transfer to Toilet: with modified independence;ambulating Pt Will Perform Toileting - Clothing Manipulation and hygiene: with modified independence;sit to/from stand Additional ADL Goal #1: Pt will recall at least 2 ways to compensate for  memory deficits in regards to ADL/IADL  OT Frequency: Min 2X/week   Barriers to D/C:            Co-evaluation              AM-PAC OT "6 Clicks" Daily Activity     Outcome Measure Help from another person eating meals?: None Help from another person taking care of personal grooming?: A Little Help from another person toileting, which includes using toliet, bedpan, or urinal?: A Little Help from another person bathing (including washing, rinsing, drying)?: A Lot Help from another person to put on and  taking off regular upper body clothing?: None Help from another person to put on and taking off regular lower body clothing?: A Lot 6 Click Score: 18   End of Session Equipment Utilized During Treatment: Gait belt;Rolling walker Nurse Communication: Mobility status  Activity Tolerance: Patient tolerated treatment well Patient left: in chair;with nursing/sitter in room;with family/visitor present  OT Visit Diagnosis: Unsteadiness on feet (R26.81);Other abnormalities of gait and mobility (R26.89);Repeated falls (R29.6);History of falling (Z91.81);Other symptoms and signs involving cognitive function                Time: 1350-1414 OT Time Calculation (min): 24 min Charges:  OT General Charges $OT Visit: 1 Visit OT Evaluation $OT Eval Moderate Complexity: 1 Mod OT Treatments $Cognitive Funtion inital: Initial 15 mins  Hulda Humphrey OTR/L Acute Rehabilitation Services Pager: [phone] Office: [place] 12/20/2018, 4:39 PM

## 2018-12-20 NOTE — Evaluation (Addendum)
Physical Therapy Evaluation Patient Details Name: Hunter Boone MRN: [id number] DOB: 1945/08/21 Today's Date: 12/20/2018   History of Present Illness  73 y.o. male with medical history significant of hypertension, diverticulosis, BPH, previous fall was traumatic pneumothorax, prediabetes, macular degeneration, lung nodules and recurrent falls in the past. He came to the ER after sustaining mechanical fall at home. Xray revealed L hip fx. He underwent L THA 12/19/18.    Clinical Impression  Pt is s/p THA resulting in the deficits listed below (see PT Problem List). PTA pt independent, living at home with his wife. On eval, he required supervision bed mobility, min guard assist transfers, and min guard assist ambulation 150 feet with RW. Pt instructed in LE exercises. Handouts provided. Post-op he is presenting with confusion and change in cognitive status, which is improving. Pt will benefit from skilled PT to increase their independence and safety with mobility to allow discharge to the venue listed below. Pt has 5 steps with no rails to enter his house.     Follow Up Recommendations Home health PT;Supervision/Assistance - 24 hour    Equipment Recommendations  Rolling walker with 5" wheels    Recommendations for Other Services       Precautions / Restrictions Precautions Precautions: Fall Restrictions Weight Bearing Restrictions: Yes LLE Weight Bearing: Weight bearing as tolerated      Mobility  Bed Mobility Overal bed mobility: Needs Assistance Bed Mobility: Supine to Sit     Supine to sit: Supervision;HOB elevated     General bed mobility comments: +rail, supervision for safety  Transfers Overall transfer level: Needs assistance Equipment used: Rolling walker (2 wheeled) Transfers: Sit to/from Stand Sit to Stand: Min guard         General transfer comment: cues for hand placement, min guard for safety  Ambulation/Gait Ambulation/Gait assistance: Min guard Gait  Distance (Feet): 150 Feet Assistive device: Rolling walker (2 wheeled) Gait Pattern/deviations: Step-through pattern;Decreased stride length Gait velocity: WFL Gait velocity interpretation: 1.31 - 2.62 ft/sec, indicative of limited community ambulator General Gait Details: steady gait, close min guard for safety  Stairs            Wheelchair Mobility    Modified Rankin (Stroke Patients Only)       Balance Overall balance assessment: Needs assistance Sitting-balance support: No upper extremity supported;Feet supported Sitting balance-Leahy Scale: Good     Standing balance support: Bilateral upper extremity supported;During functional activity Standing balance-Leahy Scale: Poor Standing balance comment: reliant on RW                             Pertinent Vitals/Pain Pain Assessment: No/denies pain    Home Living Family/patient expects to be discharged to:: Private residence Living Arrangements: Spouse/significant other Available Help at Discharge: Family;Available 24 hours/day Type of Home: House Home Access: Stairs to enter Entrance Stairs-Rails: None Entrance Stairs-Number of Steps: 5 Home Layout: One level Home Equipment: None      Prior Function Level of Independence: Independent               Hand Dominance        Extremity/Trunk Assessment   Upper Extremity Assessment Upper Extremity Assessment: Overall WFL for tasks assessed    Lower Extremity Assessment Lower Extremity Assessment: LLE deficits/detail LLE Deficits / Details: s/p THA, anterior approach    Cervical / Trunk Assessment Cervical / Trunk Assessment: Kyphotic  Communication   Communication: No difficulties  Cognition Arousal/Alertness:  Awake/alert Behavior During Therapy: WFL for tasks assessed/performed Overall Cognitive Status: Impaired/Different from baseline Area of Impairment: Orientation;Memory;Safety/judgement;Problem solving                  Orientation Level: Disoriented to;Time;Situation   Memory: Decreased short-term memory   Safety/Judgement: Decreased awareness of safety   Problem Solving: Difficulty sequencing;Requires verbal cues General Comments: More clear this morning as compared to nursing notes overnight. Cooperative.      General Comments      Exercises Total Joint Exercises Ankle Circles/Pumps: AROM;Both;10 reps Quad Sets: AROM;Both;10 reps Heel Slides: AAROM;Left;10 reps Hip ABduction/ADduction: AROM;Left;10 reps   Assessment/Plan    PT Assessment Patient needs continued PT services  PT Problem List Decreased mobility;Decreased safety awareness;Decreased knowledge of precautions;Decreased activity tolerance;Decreased balance;Decreased knowledge of use of DME       PT Treatment Interventions DME instruction;Therapeutic activities;Cognitive remediation;Gait training;Therapeutic exercise;Patient/family education;Stair training;Balance training;Functional mobility training    PT Goals (Current goals can be found in the Care Plan section)  Acute Rehab PT Goals Patient Stated Goal: home PT Goal Formulation: With patient/family Time For Goal Achievement: 12/27/18 Potential to Achieve Goals: Good    Frequency Min 6X/week   Barriers to discharge        Co-evaluation               AM-PAC PT "6 Clicks" Mobility  Outcome Measure Help needed turning from your back to your side while in a flat bed without using bedrails?: None Help needed moving from lying on your back to sitting on the side of a flat bed without using bedrails?: A Little Help needed moving to and from a bed to a chair (including a wheelchair)?: A Little Help needed standing up from a chair using your arms (e.g., wheelchair or bedside chair)?: A Little Help needed to walk in hospital room?: A Little Help needed climbing 3-5 steps with a railing? : A Little 6 Click Score: 19    End of Session Equipment Utilized During  Treatment: Gait belt Activity Tolerance: Patient tolerated treatment well Patient left: in chair;with nursing/sitter in room;with family/visitor present;with call bell/phone within reach Nurse Communication: Mobility status PT Visit Diagnosis: Other abnormalities of gait and mobility (R26.89)    Time: 1497-0263 PT Time Calculation (min) (ACUTE ONLY): 32 min   Charges:   PT Evaluation $PT Eval Moderate Complexity: 1 Mod PT Treatments $Therapeutic Exercise: 8-22 mins        Lorrin Goodell, PT  Office # [phone] Pager [phone]   Lorriane Shire 12/20/2018, 2:03 PM

## 2018-12-20 NOTE — Plan of Care (Signed)

## 2018-12-20 NOTE — Care Management Important Message (Signed)
Important Message  Patient Details  Name: Hunter Boone MRN: [id number] Date of Birth: 1945-10-28   Medicare Important Message Given:  Yes     Memory Argue 12/20/2018, 1:38 PM

## 2018-12-20 NOTE — TOC Transition Note (Addendum)
Transition of Care [facility]) - CM/SW Discharge Note   Patient Details  Name: Hunter Boone MRN: [id number] Date of Birth: July 03, 1945  Transition of Care [facility]) CM/SW Contact:  Alberteen Sam, LCSW Phone Number: 12/20/2018, 3:45 PM   Clinical Narrative:     CSW spoke with patient's wife Hunter Boone regarding discharge planning, agreeable to [place] through [place].     Barriers to Discharge: No Barriers Identified   Patient Goals and CMS Choice   CMS Medicare.gov Compare Post Acute Care list provided to:: Patient Represenative (must comment)(Susan (spouse)) Choice offered to / list presented to : Spouse  Discharge Placement    [facility] up with [facility] services as well as speech therapy and memory program. Wife Hunter Boone informed, no needs for rolling walker she reports as they have this at home.   No further needs identified. Patient stable for discharge at this time.           Discharge Plan and Services     Post Acute Care Choice: Home Health                    HH Arranged: Speech Therapy, PT [place] Agency: [facility] Date [place]: 12/20/18 Time [place]: [place] Representative spoke with at [place]: [place] ([place]) Interventions     Readmission Risk Interventions No flowsheet data found.

## 2018-12-20 NOTE — Plan of Care (Signed)
  Problem: Safety: Goal: Ability to remain free from injury will improve Outcome: Progressing   Problem: Pain Managment: Goal: General experience of comfort will improve Outcome: Progressing   

## 2018-12-20 NOTE — Progress Notes (Signed)
Called to the room by [employer] cabin crew. Pt with increased confusion and agitation. Some aggression noted towards staff. Unable to reorient patient. Patient believes he is home and wants to go out and smoke a cigarette. Call out to HS coverage, order renewed for safety sitter and Haldol 2 mg IV for one time dose.  Call out to family, wife [name]) and son Randall Hiss) [phone] both are concerned with patients behavior of confusion and forgetfulness. They both agree that this behavior seems heighten since this hospital admit.  Both would like a psych consult or evaluation for dementia prior to discharge.  Followed up with day shift nurse with above concerns.

## 2018-12-21 ENCOUNTER — Telehealth: Payer: Self-pay | Admitting: *Deleted

## 2018-12-21 DIAGNOSIS — J439 Emphysema, unspecified: Secondary | ICD-10-CM | POA: Diagnosis not present

## 2018-12-21 DIAGNOSIS — R7303 Prediabetes: Secondary | ICD-10-CM | POA: Diagnosis not present

## 2018-12-21 DIAGNOSIS — I10 Essential (primary) hypertension: Secondary | ICD-10-CM | POA: Diagnosis not present

## 2018-12-21 DIAGNOSIS — N138 Other obstructive and reflux uropathy: Secondary | ICD-10-CM | POA: Diagnosis not present

## 2018-12-21 DIAGNOSIS — H3530 Unspecified macular degeneration: Secondary | ICD-10-CM | POA: Diagnosis not present

## 2018-12-21 DIAGNOSIS — R911 Solitary pulmonary nodule: Secondary | ICD-10-CM | POA: Diagnosis not present

## 2018-12-21 DIAGNOSIS — K5790 Diverticulosis of intestine, part unspecified, without perforation or abscess without bleeding: Secondary | ICD-10-CM | POA: Diagnosis not present

## 2018-12-21 DIAGNOSIS — Z9181 History of falling: Secondary | ICD-10-CM | POA: Diagnosis not present

## 2018-12-21 DIAGNOSIS — S72002D Fracture of unspecified part of neck of left femur, subsequent encounter for closed fracture with routine healing: Secondary | ICD-10-CM | POA: Diagnosis not present

## 2018-12-21 NOTE — Telephone Encounter (Signed)
Pt was on TCM report per summary need f/u in 1 week. Tried calling pt there was no answer LMOM RTC.Marland KitchenJohny Chess

## 2018-12-22 ENCOUNTER — Telehealth: Payer: Self-pay | Admitting: Internal Medicine

## 2018-12-22 NOTE — Telephone Encounter (Signed)
Patient wife(Hunter Boone) called stating patient has been having diarrhea and wanted to see if PCP can prescribed him anything or make virtual app. Pharmacy: [facility], [place] [phone] (Phone) [phone] (Fax)     Call back # [phone]

## 2018-12-22 NOTE — Telephone Encounter (Signed)
Advised wife,Hunter Boone of dr burns note/instructions, patient's wife repeated back for understanding

## 2018-12-22 NOTE — Telephone Encounter (Signed)
Keep up with fluids, bland diet  Try to avoid imodium if possible but can take it absolutely needed  If unable to keep hydrated may need to go to ED for fluids or if diarrhea not improving.     If diarrhea continues it needs to be further evaluated with stool studies to rule out a bacterial cause since he was just in the hospital

## 2018-12-22 NOTE — Telephone Encounter (Signed)
Pt return call back hosp f/u appt made for 12/27/18 completed TCM call below.Hunter Boone  Transition Care Management Follow-up Telephone Call   Date discharged? 12/20/18   How have you been since you were released from the hospital? Pt states he is doing ok   Do you understand why you were in the hospital? YES   Do you understand the discharge instructions? YES   Where were you discharged to? Home   Items Reviewed:  Medications reviewed: YES, no longer taking tamulosin, but med list has already been updated  Allergies reviewed: YES, no changes  Dietary changes reviewed: YES  Referrals reviewed: YES, he states he has not been contacted w/home heath or physical therapy   Functional Questionnaire:   Activities of Daily Living (ADLs):   He states he are independent in the following: bathing and hygiene, feeding, continence, grooming and toileting States they require assistance with the following: ambulation and dressing   Any transportation issues/concerns?: NO   Any patient concerns? NO   Confirmed importance and date/time of follow-up visits scheduled YES, appt 12/27/18  Provider Appointment booked with Dr. Quay Burow  Confirmed with patient if condition begins to worsen call PCP or go to the ER.  Patient was given the office number and encouraged to call back with question or concerns.  : YES

## 2018-12-23 DIAGNOSIS — J439 Emphysema, unspecified: Secondary | ICD-10-CM | POA: Diagnosis not present

## 2018-12-23 DIAGNOSIS — Z9181 History of falling: Secondary | ICD-10-CM | POA: Diagnosis not present

## 2018-12-23 DIAGNOSIS — K5790 Diverticulosis of intestine, part unspecified, without perforation or abscess without bleeding: Secondary | ICD-10-CM | POA: Diagnosis not present

## 2018-12-23 DIAGNOSIS — R7303 Prediabetes: Secondary | ICD-10-CM | POA: Diagnosis not present

## 2018-12-23 DIAGNOSIS — N138 Other obstructive and reflux uropathy: Secondary | ICD-10-CM | POA: Diagnosis not present

## 2018-12-23 DIAGNOSIS — I10 Essential (primary) hypertension: Secondary | ICD-10-CM | POA: Diagnosis not present

## 2018-12-23 DIAGNOSIS — S72002D Fracture of unspecified part of neck of left femur, subsequent encounter for closed fracture with routine healing: Secondary | ICD-10-CM | POA: Diagnosis not present

## 2018-12-23 DIAGNOSIS — R911 Solitary pulmonary nodule: Secondary | ICD-10-CM | POA: Diagnosis not present

## 2018-12-23 DIAGNOSIS — H3530 Unspecified macular degeneration: Secondary | ICD-10-CM | POA: Diagnosis not present

## 2018-12-26 NOTE — Progress Notes (Signed)
Subjective:    Patient ID: Hunter Boone, male    DOB: 07-29-1945, 73 y.o.   MRN: [id number]  HPI The patient is here for follow up from the hospital.  His wife is here with him.   Admitted 12/17/18 - 12/20/18 for left femoral neck fx after fall at home  Discharged to home with rolling walker, PT, RN  Recommendations for Outpatient Follow-up:  1. Follow up with PCP in 1 week, recommend repeat CBC to assess WBC count 2. Follow-up with orthopedic surgery, Dr. Lyla Glassing in 2 weeks for post-op wound check 3. Recommend PCP refer patient for formal cognitive testing for possible underlying dementia  He fell at home onto his left buttocks.  It was a mechanical fall and he states his feet just got tangled up.  He went to the ED with left hip pain and was found to have a left closed femoral neck fracture.  His pain was 6/10.  He denied head trauma and other injuries.   In the ED he was hemodynamically stable.  Blood work unremarkable.  CXR normal.  Xray of left hip showed displaced left femoral neck fracture.      Left close femoral neck fx:  He had left total hip arthroplasty on 12/19/18 by Dr Lyla Glassing.  PT saw patient and recommended home PT and rolling walker.  On ASA 81 mg twice daily for DVT prophylaxis.   Follow up orthopedics in 2 weeks for wound check - next week.  He has minimal pain for his hip - he has taken celebrex, which improves his pain.  He is doing PT at home and started using the rolling walker, but now is primarily using the cane.  His wife was concerned about his surgical incision - it may have gotten wet and she was concerned about possible infection.    COPD:  Stable.  Counseled on tobacco cessation.    Hypertension:  BP well controlled.  Continue home medications.   BPH:  He was advised to discontinue tamsulosin.  In the d/c summary it said to discontinue in one area and continue in a different are.  He has stopped the medication, but his wife wonder if he needs it or not.  He  is taking saw palmetto.  He follows with urology and saw them 08/2018.  He has frequent urination.   He does have a history of bladder retention and elevated PSA.  Leukocytosis:  Likely stress related.  Afebrile, no concerning symptoms.  COVID negative.  Recommend CBC for follow up.   Delirium:  Delirium while in the hospital.  Ongoing memory issues at home per family.  Strong family history of dementia - parents and sister.  Possible mild cognitive impairment.  Ct head negative.  Neuro exam non focal.  Family/ patient denies excessive alcohol use.  Formal cognitive testing as an outpatient recommended.  He states his memory is not as good as it used to be but ok.  Prediabetes: He is compliant with a low sugar diet.  He may eat more carbohydrates than he should.  Medications and allergies reviewed with patient and updated if appropriate.  Patient Active Problem List   Diagnosis Date Noted   Closed left femoral fracture ([place]) 12/17/2018   Fall at home, initial encounter 12/17/2018   Bladder retention 08/16/2017   Elevated PSA 08/16/2017   Bilateral leg edema 07/29/2017   Nocturnal enuresis 07/29/2017   Mouth ulcer 05/12/2017   Prediabetes 02/01/2017   COPD (chronic obstructive pulmonary  disease) with emphysema ([place]) 06/13/2016   Fall 05/25/2016   Fracture of multiple ribs of left side 05/25/2016   Lung nodule-repeat CT due 04/2019 05/25/2016   Traumatic pneumothorax 05/24/2016   Pityriasis rosea 08/22/2015   Nonspecific abnormal electrocardiogram (ECG) (EKG) 10/25/2012   BPH with urinary obstruction 07/11/2009   Prolapsed internal hemorrhoids, grade 3, s/p hemorrhoidal ligation/pexy/excision 07/01/2017 04/10/2009   External hemorrhoids status post external hemorrhoidectomy 07/01/2017  04/10/2009   DIVERTICULOSIS-COLON 04/10/2009   PERSONAL HX COLONIC POLYPS 04/10/2009   Tobacco abuse, smokes pipe 01/26/2008   Essential hypertension 01/26/2008    Current  Outpatient Medications on File Prior to Visit  Medication Sig Dispense Refill   amLODipine (NORVASC) 5 MG tablet Take 1 tablet (5 mg total) by mouth daily. 90 tablet 3   aspirin EC 81 MG EC tablet Take 1 tablet (81 mg total) by mouth 2 (two) times daily with a meal. 68 tablet 0   celecoxib (CELEBREX) 200 MG capsule Take 1 capsule (200 mg total) by mouth 2 (two) times daily for 14 days. 28 capsule 0   fluticasone (FLONASE) 50 MCG/ACT nasal spray USE 2 SPRAYS IN BOTH  NOSTRILS DAILY (Patient taking differently: Place 2 sprays into both nostrils daily as needed for allergies. ) 48 g 1   furosemide (LASIX) 20 MG tablet TAKE 1 TABLET EACH DAY. (Patient taking differently: Take 20 mg by mouth daily. ) 90 tablet 0   losartan (COZAAR) 100 MG tablet Take 1 tablet (100 mg total) by mouth daily. -- Office visit needed for further refills 90 tablet 3   LUTEIN PO Take 1 capsule by mouth daily.      Multiple Vitamins-Minerals (CENTRUM PO) Take 1 tablet by mouth daily.      Multiple Vitamins-Minerals (PRESERVISION AREDS 2 PO) Take 1 capsule by mouth every morning.      polyvinyl alcohol (LIQUIFILM TEARS) 1.4 % ophthalmic solution Place 1 drop into both eyes daily as needed for dry eyes.     Saw Palmetto 450 MG CAPS Take 450 mg by mouth daily.      vitamin C (ASCORBIC ACID) 500 MG tablet Take 500 mg by mouth daily.     No current facility-administered medications on file prior to visit.     Past Medical History:  Diagnosis Date   Hypertension    Internal hemorrhoids    Macular degeneration    Tubular adenoma of colon     Past Surgical History:  Procedure Laterality Date   adenomatous polyps  2009, 2011,2014    Dr Fuller Plan;    COLONOSCOPY     EVALUATION UNDER ANESTHESIA WITH HEMORRHOIDECTOMY N/A 07/01/2017   Procedure: ANORECTAL EXAMINATION UNDER ANESTHESIA, HEMORRHOIDECTOMY, HEMORRHOIDAL LIGATION/PEXY;  Surgeon: Michael Boston, MD;  Location: [facility];  Service: General;  Laterality: N/A;   GENERAL AND LOCAL   POLYPECTOMY     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   Procedure: TOTAL HIP ARTHROPLASTY ANTERIOR APPROACH;  Surgeon: Rod Can, MD;  Location: [place];  Service: Orthopedics;  Laterality: Left;    Social History   Socioeconomic History   Marital status: Married    Spouse name: Not on file   Number of children: Not on file   Years of education: Not on file   Highest education level: Not on file  Occupational History   Occupation: retired  Scientist, product/process development strain: Not on file   Food insecurity    Worry: Not on file    Inability: Not on file  Transportation needs    Medical: Not on file    Non-medical: Not on file  Tobacco Use   Smoking status: Current Some Day Smoker    Packs/day: 0.50    Types: Pipe   Smokeless tobacco: Never Used   Tobacco comment: Presently 2 bowls / day; smoked cigarettes age [age] , up to 1 ppd  Substance and Sexual Activity   Alcohol use: Yes    Alcohol/week: 7.0 standard drinks    Types: 7 Shots of liquor per week    Comment: drink daily    Drug use: No   Sexual activity: Not on file  Lifestyle   Physical activity    Days per week: Not on file    Minutes per session: Not on file   Stress: Not on file  Relationships   Social connections    Talks on phone: Not on file    Gets together: Not on file    Attends religious service: Not on file    Active member of club or organization: Not on file    Attends meetings of clubs or organizations: Not on file    Relationship status: Not on file  Other Topics Concern   Not on file  Social History Narrative   Not on file    Family History  Problem Relation Age of Onset   Heart attack Father        early 76s   Diabetes Father    Diabetes Mother    Liver cancer Maternal Aunt    Stroke Neg Hx    COPD Neg Hx    Colon cancer Neg Hx    Esophageal cancer Neg Hx    Rectal cancer Neg Hx    Stomach cancer Neg Hx    Asthma Neg  Hx     Review of Systems  Constitutional: Negative for appetite change, chills and fever.  Respiratory: Negative for cough, shortness of breath and wheezing.   Cardiovascular: Positive for leg swelling (left leg - since sugery). Negative for chest pain and palpitations.  Gastrointestinal: Positive for diarrhea (occ). Negative for abdominal pain, blood in stool and nausea.  Skin: Negative for color change.  Neurological: Negative for light-headedness and headaches.  Psychiatric/Behavioral: Negative for sleep disturbance.       Objective:   Vitals:   12/27/18 0959  BP: 140/76  Pulse: 98  Temp: 98.2 F (36.8 C)   BP Readings from Last 3 Encounters:  12/27/18 140/76  12/20/18 130/69  04/20/18 138/80   Wt Readings from Last 3 Encounters:  12/27/18 131 lb (59.4 kg)  12/17/18 130 lb 15.3 oz (59.4 kg)  04/20/18 131 lb (59.4 kg)   Body mass index is 17.77 kg/m.   Physical Exam    Constitutional: Appears well-developed and well-nourished. No distress.  HENT:  Head: Normocephalic and atraumatic.  Neck: Neck supple. No tracheal deviation present. No thyromegaly present.  No cervical lymphadenopathy Cardiovascular: Normal rate, regular rhythm and normal heart sounds.   No murmur heard. No carotid bruit .  No edema Pulmonary/Chest: Effort normal and breath sounds normal. No respiratory distress. No has no wheezes. No rales.  Abdomen: soft, NT, ND Skin: Skin is warm and dry. Not diaphoretic. Left hip surgical incision healing well - bandage still on, but lower bandage lifted easily to view.  No redness.  Mild swelling.  Psychiatric: Normal mood and affect. Behavior is normal.      Assessment & Plan:    See Problem List for Assessment and Plan  of chronic medical problems.

## 2018-12-27 ENCOUNTER — Other Ambulatory Visit (INDEPENDENT_AMBULATORY_CARE_PROVIDER_SITE_OTHER): Payer: Medicare Other

## 2018-12-27 ENCOUNTER — Ambulatory Visit (INDEPENDENT_AMBULATORY_CARE_PROVIDER_SITE_OTHER): Payer: Medicare Other | Admitting: Internal Medicine

## 2018-12-27 ENCOUNTER — Encounter: Payer: Self-pay | Admitting: Internal Medicine

## 2018-12-27 ENCOUNTER — Other Ambulatory Visit: Payer: Self-pay

## 2018-12-27 VITALS — BP 140/76 | HR 98 | Temp 98.2°F | Wt 131.0 lb

## 2018-12-27 DIAGNOSIS — H3530 Unspecified macular degeneration: Secondary | ICD-10-CM | POA: Diagnosis not present

## 2018-12-27 DIAGNOSIS — R413 Other amnesia: Secondary | ICD-10-CM

## 2018-12-27 DIAGNOSIS — D72829 Elevated white blood cell count, unspecified: Secondary | ICD-10-CM | POA: Insufficient documentation

## 2018-12-27 DIAGNOSIS — N401 Enlarged prostate with lower urinary tract symptoms: Secondary | ICD-10-CM

## 2018-12-27 DIAGNOSIS — S72142A Displaced intertrochanteric fracture of left femur, initial encounter for closed fracture: Secondary | ICD-10-CM

## 2018-12-27 DIAGNOSIS — J439 Emphysema, unspecified: Secondary | ICD-10-CM | POA: Diagnosis not present

## 2018-12-27 DIAGNOSIS — K5790 Diverticulosis of intestine, part unspecified, without perforation or abscess without bleeding: Secondary | ICD-10-CM | POA: Diagnosis not present

## 2018-12-27 DIAGNOSIS — Z96642 Presence of left artificial hip joint: Secondary | ICD-10-CM | POA: Diagnosis not present

## 2018-12-27 DIAGNOSIS — I10 Essential (primary) hypertension: Secondary | ICD-10-CM | POA: Diagnosis not present

## 2018-12-27 DIAGNOSIS — R7303 Prediabetes: Secondary | ICD-10-CM

## 2018-12-27 DIAGNOSIS — S72002D Fracture of unspecified part of neck of left femur, subsequent encounter for closed fracture with routine healing: Secondary | ICD-10-CM | POA: Diagnosis not present

## 2018-12-27 DIAGNOSIS — N138 Other obstructive and reflux uropathy: Secondary | ICD-10-CM | POA: Diagnosis not present

## 2018-12-27 DIAGNOSIS — R911 Solitary pulmonary nodule: Secondary | ICD-10-CM | POA: Diagnosis not present

## 2018-12-27 DIAGNOSIS — Z9181 History of falling: Secondary | ICD-10-CM | POA: Diagnosis not present

## 2018-12-27 DIAGNOSIS — Z96649 Presence of unspecified artificial hip joint: Secondary | ICD-10-CM | POA: Insufficient documentation

## 2018-12-27 LAB — CBC WITH DIFFERENTIAL/PLATELET
Basophils Absolute: 0.1 10*3/uL (ref 0.0–0.1)
Basophils Relative: 0.7 % (ref 0.0–3.0)
Eosinophils Absolute: 0.3 10*3/uL (ref 0.0–0.7)
Eosinophils Relative: 2.4 % (ref 0.0–5.0)
HCT: 34.4 % — ABNORMAL LOW (ref 39.0–52.0)
Hemoglobin: 11.6 g/dL — ABNORMAL LOW (ref 13.0–17.0)
Lymphocytes Relative: 15.5 % (ref 12.0–46.0)
Lymphs Abs: 1.7 10*3/uL (ref 0.7–4.0)
MCHC: 33.7 g/dL (ref 30.0–36.0)
MCV: 95.6 fl (ref 78.0–100.0)
Monocytes Absolute: 1.2 10*3/uL — ABNORMAL HIGH (ref 0.1–1.0)
Monocytes Relative: 10.7 % (ref 3.0–12.0)
Neutro Abs: 7.6 10*3/uL (ref 1.4–7.7)
Neutrophils Relative %: 70.7 % (ref 43.0–77.0)
Platelets: 499 10*3/uL — ABNORMAL HIGH (ref 150.0–400.0)
RBC: 3.59 Mil/uL — ABNORMAL LOW (ref 4.22–5.81)
RDW: 13.3 % (ref 11.5–15.5)
WBC: 10.8 10*3/uL — ABNORMAL HIGH (ref 4.0–10.5)

## 2018-12-27 LAB — HEMOGLOBIN A1C: Hgb A1c MFr Bld: 5.5 % (ref 4.6–6.5)

## 2018-12-27 MED ORDER — TAMSULOSIN HCL 0.4 MG PO CAPS: 0.4000 mg | ORAL_CAPSULE | Freq: Every day | ORAL | 3 refills | Status: DC

## 2018-12-27 NOTE — Assessment & Plan Note (Signed)
While in hospital Likely reactive Will recheck CBC

## 2018-12-27 NOTE — Assessment & Plan Note (Signed)
He is following with urology and has a history of urinary obstruction with elevated PSA Needs to be on Flomax-we will restart Follow-up with neurology as scheduled

## 2018-12-27 NOTE — Assessment & Plan Note (Signed)
Stable He does smoke a pipe and does not have desire to quit

## 2018-12-27 NOTE — Assessment & Plan Note (Signed)
BP well controlled Current regimen effective and well tolerated Continue current medications at current doses  

## 2018-12-27 NOTE — Assessment & Plan Note (Signed)
Compliant with diabetic diet Check A1c since he is getting blood work done

## 2018-12-27 NOTE — Assessment & Plan Note (Signed)
Has had some memory changes and had delirium while in the hospital Discussed with patient and his wife and recommended that he has further evaluation, which she did agree with Referral ordered for neurology

## 2018-12-27 NOTE — Patient Instructions (Addendum)
  Tests ordered today. Your results will be released to [place] (or called to you) after review.  If any changes need to be made, you will be notified at that same time.    Medications reviewed and updated.  Changes include :   none   A referral was ordered for neurology  Please followup in 8 months

## 2018-12-27 NOTE — Assessment & Plan Note (Signed)
Status post surgery 12/19/2018 We will follow-up with orthopedics next week Pain controlled with as needed Celebrex Incision looks good without infection Continue physical therapy Using cane to ambulate

## 2018-12-29 DIAGNOSIS — I10 Essential (primary) hypertension: Secondary | ICD-10-CM | POA: Diagnosis not present

## 2018-12-29 DIAGNOSIS — H3530 Unspecified macular degeneration: Secondary | ICD-10-CM | POA: Diagnosis not present

## 2018-12-29 DIAGNOSIS — R7303 Prediabetes: Secondary | ICD-10-CM | POA: Diagnosis not present

## 2018-12-29 DIAGNOSIS — Z9181 History of falling: Secondary | ICD-10-CM | POA: Diagnosis not present

## 2018-12-29 DIAGNOSIS — J439 Emphysema, unspecified: Secondary | ICD-10-CM | POA: Diagnosis not present

## 2018-12-29 DIAGNOSIS — R911 Solitary pulmonary nodule: Secondary | ICD-10-CM | POA: Diagnosis not present

## 2018-12-29 DIAGNOSIS — N138 Other obstructive and reflux uropathy: Secondary | ICD-10-CM | POA: Diagnosis not present

## 2018-12-29 DIAGNOSIS — S72002D Fracture of unspecified part of neck of left femur, subsequent encounter for closed fracture with routine healing: Secondary | ICD-10-CM | POA: Diagnosis not present

## 2018-12-29 DIAGNOSIS — K5790 Diverticulosis of intestine, part unspecified, without perforation or abscess without bleeding: Secondary | ICD-10-CM | POA: Diagnosis not present

## 2019-01-01 DIAGNOSIS — R911 Solitary pulmonary nodule: Secondary | ICD-10-CM | POA: Diagnosis not present

## 2019-01-01 DIAGNOSIS — S72002D Fracture of unspecified part of neck of left femur, subsequent encounter for closed fracture with routine healing: Secondary | ICD-10-CM | POA: Diagnosis not present

## 2019-01-01 DIAGNOSIS — R7303 Prediabetes: Secondary | ICD-10-CM | POA: Diagnosis not present

## 2019-01-01 DIAGNOSIS — K5790 Diverticulosis of intestine, part unspecified, without perforation or abscess without bleeding: Secondary | ICD-10-CM | POA: Diagnosis not present

## 2019-01-01 DIAGNOSIS — H3530 Unspecified macular degeneration: Secondary | ICD-10-CM | POA: Diagnosis not present

## 2019-01-01 DIAGNOSIS — N138 Other obstructive and reflux uropathy: Secondary | ICD-10-CM | POA: Diagnosis not present

## 2019-01-01 DIAGNOSIS — Z9181 History of falling: Secondary | ICD-10-CM | POA: Diagnosis not present

## 2019-01-01 DIAGNOSIS — J439 Emphysema, unspecified: Secondary | ICD-10-CM | POA: Diagnosis not present

## 2019-01-01 DIAGNOSIS — I10 Essential (primary) hypertension: Secondary | ICD-10-CM | POA: Diagnosis not present

## 2019-01-02 DIAGNOSIS — S72032D Displaced midcervical fracture of left femur, subsequent encounter for closed fracture with routine healing: Secondary | ICD-10-CM | POA: Diagnosis not present

## 2019-01-03 DIAGNOSIS — Z9181 History of falling: Secondary | ICD-10-CM

## 2019-01-03 DIAGNOSIS — F17210 Nicotine dependence, cigarettes, uncomplicated: Secondary | ICD-10-CM | POA: Diagnosis not present

## 2019-01-03 DIAGNOSIS — H3530 Unspecified macular degeneration: Secondary | ICD-10-CM

## 2019-01-03 DIAGNOSIS — R911 Solitary pulmonary nodule: Secondary | ICD-10-CM

## 2019-01-03 DIAGNOSIS — N138 Other obstructive and reflux uropathy: Secondary | ICD-10-CM

## 2019-01-03 DIAGNOSIS — I10 Essential (primary) hypertension: Secondary | ICD-10-CM | POA: Diagnosis not present

## 2019-01-03 DIAGNOSIS — N401 Enlarged prostate with lower urinary tract symptoms: Secondary | ICD-10-CM

## 2019-01-03 DIAGNOSIS — K5790 Diverticulosis of intestine, part unspecified, without perforation or abscess without bleeding: Secondary | ICD-10-CM

## 2019-01-03 DIAGNOSIS — J439 Emphysema, unspecified: Secondary | ICD-10-CM | POA: Diagnosis not present

## 2019-01-03 DIAGNOSIS — S72002D Fracture of unspecified part of neck of left femur, subsequent encounter for closed fracture with routine healing: Secondary | ICD-10-CM | POA: Diagnosis not present

## 2019-01-03 DIAGNOSIS — R7303 Prediabetes: Secondary | ICD-10-CM

## 2019-01-04 DIAGNOSIS — Z9181 History of falling: Secondary | ICD-10-CM | POA: Diagnosis not present

## 2019-01-04 DIAGNOSIS — N138 Other obstructive and reflux uropathy: Secondary | ICD-10-CM | POA: Diagnosis not present

## 2019-01-04 DIAGNOSIS — K5790 Diverticulosis of intestine, part unspecified, without perforation or abscess without bleeding: Secondary | ICD-10-CM | POA: Diagnosis not present

## 2019-01-04 DIAGNOSIS — I10 Essential (primary) hypertension: Secondary | ICD-10-CM | POA: Diagnosis not present

## 2019-01-04 DIAGNOSIS — H3530 Unspecified macular degeneration: Secondary | ICD-10-CM | POA: Diagnosis not present

## 2019-01-04 DIAGNOSIS — S72002D Fracture of unspecified part of neck of left femur, subsequent encounter for closed fracture with routine healing: Secondary | ICD-10-CM | POA: Diagnosis not present

## 2019-01-04 DIAGNOSIS — R7303 Prediabetes: Secondary | ICD-10-CM | POA: Diagnosis not present

## 2019-01-04 DIAGNOSIS — R911 Solitary pulmonary nodule: Secondary | ICD-10-CM | POA: Diagnosis not present

## 2019-01-04 DIAGNOSIS — J439 Emphysema, unspecified: Secondary | ICD-10-CM | POA: Diagnosis not present

## 2019-01-08 ENCOUNTER — Other Ambulatory Visit: Payer: Self-pay | Admitting: Internal Medicine

## 2019-01-08 DIAGNOSIS — N138 Other obstructive and reflux uropathy: Secondary | ICD-10-CM | POA: Diagnosis not present

## 2019-01-08 DIAGNOSIS — Z9181 History of falling: Secondary | ICD-10-CM | POA: Diagnosis not present

## 2019-01-08 DIAGNOSIS — J439 Emphysema, unspecified: Secondary | ICD-10-CM | POA: Diagnosis not present

## 2019-01-08 DIAGNOSIS — H3530 Unspecified macular degeneration: Secondary | ICD-10-CM | POA: Diagnosis not present

## 2019-01-08 DIAGNOSIS — I10 Essential (primary) hypertension: Secondary | ICD-10-CM

## 2019-01-08 DIAGNOSIS — R7303 Prediabetes: Secondary | ICD-10-CM | POA: Diagnosis not present

## 2019-01-08 DIAGNOSIS — S72002D Fracture of unspecified part of neck of left femur, subsequent encounter for closed fracture with routine healing: Secondary | ICD-10-CM | POA: Diagnosis not present

## 2019-01-08 DIAGNOSIS — K5790 Diverticulosis of intestine, part unspecified, without perforation or abscess without bleeding: Secondary | ICD-10-CM | POA: Diagnosis not present

## 2019-01-08 DIAGNOSIS — R911 Solitary pulmonary nodule: Secondary | ICD-10-CM | POA: Diagnosis not present

## 2019-01-09 DIAGNOSIS — S72002D Fracture of unspecified part of neck of left femur, subsequent encounter for closed fracture with routine healing: Secondary | ICD-10-CM | POA: Diagnosis not present

## 2019-01-09 DIAGNOSIS — K5790 Diverticulosis of intestine, part unspecified, without perforation or abscess without bleeding: Secondary | ICD-10-CM | POA: Diagnosis not present

## 2019-01-09 DIAGNOSIS — I10 Essential (primary) hypertension: Secondary | ICD-10-CM | POA: Diagnosis not present

## 2019-01-09 DIAGNOSIS — N138 Other obstructive and reflux uropathy: Secondary | ICD-10-CM | POA: Diagnosis not present

## 2019-01-09 DIAGNOSIS — H3530 Unspecified macular degeneration: Secondary | ICD-10-CM | POA: Diagnosis not present

## 2019-01-09 DIAGNOSIS — Z9181 History of falling: Secondary | ICD-10-CM | POA: Diagnosis not present

## 2019-01-09 DIAGNOSIS — R911 Solitary pulmonary nodule: Secondary | ICD-10-CM | POA: Diagnosis not present

## 2019-01-09 DIAGNOSIS — R7303 Prediabetes: Secondary | ICD-10-CM | POA: Diagnosis not present

## 2019-01-09 DIAGNOSIS — J439 Emphysema, unspecified: Secondary | ICD-10-CM | POA: Diagnosis not present

## 2019-01-18 ENCOUNTER — Telehealth: Payer: Self-pay

## 2019-01-18 NOTE — Telephone Encounter (Signed)
[facility] Record Release form. Fax number [phone]

## 2019-01-18 NOTE — Telephone Encounter (Signed)
-----   Message from Cameron Sprang, MD sent at 01/18/2019  9:45 AM EDT ----- Regarding: RE: Results Can you pls ask wife if she can have [facility] fax their notes on the testing he had to me so I can review them as well? Thanks  Santiago Glad ----- Message ----- From: Lurene Shadow Sent: 01/18/2019   9:33 AM EDT To: Cameron Sprang, MD Subject: Results                                        Good morning,   Wife is calling in about patient and wanting to give update before appt next week. He had Cognitive and Communications Exam from the [facility]. Results of that were RAW 78, Oral Expression 89, Orientation 83, Speech Comprehension 92, Reading Comprehension 100, Writing 100, Attention 88, Problem Solving 96, Memory 37. All out of possible 100 range. Just FYI for VV on Tuesday.   Thanks, [employer]

## 2019-01-23 ENCOUNTER — Telehealth (INDEPENDENT_AMBULATORY_CARE_PROVIDER_SITE_OTHER): Payer: Medicare Other | Admitting: Neurology

## 2019-01-23 ENCOUNTER — Encounter: Payer: Self-pay | Admitting: Neurology

## 2019-01-23 ENCOUNTER — Other Ambulatory Visit: Payer: Self-pay

## 2019-01-23 ENCOUNTER — Telehealth: Payer: Self-pay | Admitting: Neurology

## 2019-01-23 VITALS — Ht 72.0 in | Wt 137.0 lb

## 2019-01-23 DIAGNOSIS — G3184 Mild cognitive impairment, so stated: Secondary | ICD-10-CM | POA: Diagnosis not present

## 2019-01-23 NOTE — Telephone Encounter (Signed)
Pt wife wants to  Have the MRI office to call her phone and where he is having the blood work her phone number is [phone] please let them know that when you send the orders

## 2019-01-23 NOTE — Telephone Encounter (Signed)
Record release sent to [facility]. Fax number [phone]. Phone number [phone].  Spoke with [employer].

## 2019-01-23 NOTE — Telephone Encounter (Signed)
This is what wife told [facility], see initial message. Can ask wife who to call for these results, thanks!

## 2019-01-23 NOTE — Progress Notes (Signed)
Virtual Visit via Video Note The purpose of this virtual visit is to provide medical care while limiting exposure to the novel coronavirus.    Consent was obtained for video visit:  Yes.   Answered questions that patient had about telehealth interaction:  Yes.   I discussed the limitations, risks, security and privacy concerns of performing an evaluation and management service by telemedicine. I also discussed with the patient that there may be a patient responsible charge related to this service. The patient expressed understanding and agreed to proceed.  Pt location: Home Physician Location: office Name of referring provider:  Binnie Rail, MD I connected with Hunter Boone at patients initiation/request on 01/23/2019 at  9:00 AM EDT by video enabled telemedicine application and verified that I am speaking with the correct person using two identifiers. [name]:  [id number] Pt DOB:  1945/10/19 Video Participants:  Hunter Boone;  Hunter Boone (spouse)   History of Present Illness:  This is a pleasant 73 year old right-handed man with a history of hypertension, macular degeneration, prior fall with traumatic pneumothorax, recurrent falls in the past, presenting for evaluation of memory loss. He feels his memory is a "7 or 8 out of 10." He started noticing changes several years ago, forgetting dates more than anything else. He lives with his wife Hunter Boone who is present to provide additional information. She started noticing short-term memory changes a few years ago where he would ask the same questions. He however remained pretty good with keeping track of finances and managing his own medications. She denied any driving concerns, he denies getting lost driving. Concern regarding his cognitive status occurred due to significant delirium during his hospitalization last 12/17/2018. He fell on his left side while cleaning out a storage space and sustained a left closed femoral neck fracture. There was  no loss of consciousness, he was brought by EMS to the ER on the evening of 7/5. Records were reviewed. It appears he received fentanyl that evening. He was scheduled for surgery the next day but on 7/6 he became confused and refused surgery. Concern was raised for possible alcohol use and risk of withdrawals, he had mentioned having a few drinks at night, his wife denied significant alcohol use. He was oriented only to self and was unable to give consent for surgery, combative, removing his IV. He kept saying he was having a haircut and wanted to leave. His wife and son tried to convince him on the phone but he was adamant and wanted to have another opinion.  He became very agitated in the evening and needed a sitter. He had a head CT with no acute changes, mild diffuse atrophy and chronic microvascular disease. Bloodwork unremarkable. On 7/7, his wife came to the hospital and tried to convince him, surgery was done that day, it was noted he was calm with no recollection of events from the day prior. He remained confused the day after, thinking he was in [place], wanting to go out and smoke a cigarette, and was given IV Haldol. He was evaluated by Speech Therapy with a MOCA score of 15/30. He was discharged home the day after surgery with home PT. He was calm once he got home. No further confusion since then. His wife reports that he is "the most calm man ever" and works as a Community education officer. No prior similar confusional episodes with surgeries in the past. No paranoia or hallucinations. His wife called our office to report  that he had a Cognitive and Communications Exam at [facility] (report unavailable for review). Per wife, results showed RAW 78, Oral Expression 89, Orientation 83, Speech Comprehension 92, Reading Comprehension 100, Writing 100, Attention 88, Problem Solving 96, Memory 37. All our of 100 range.   He has a strong family history of dementia in both parents and his sister. 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 He drinks a couple of ounces a day of bourbon for several years. Mood and sleep are good. He denies any headaches, dizziness, diplopia, dysarthria, dysphagia, neck/back pain, focal numbness/tingling/weakness, bowel/bladder dysfunction. No anosmia, tremors, no further falls. His hip is not bothering him.  Laboratory Data: Lab Results  Component Value Date   TSH 4.34 07/29/2017     PAST MEDICAL HISTORY: Past Medical History:  Diagnosis Date   Hypertension    Internal hemorrhoids    Macular degeneration    Tubular adenoma of colon     PAST SURGICAL HISTORY: Past Surgical History:  Procedure Laterality Date   adenomatous polyps  2009, 2011,2014    Dr Fuller Plan;    COLONOSCOPY     EVALUATION UNDER ANESTHESIA WITH HEMORRHOIDECTOMY N/A 07/01/2017   Procedure: ANORECTAL EXAMINATION UNDER ANESTHESIA, HEMORRHOIDECTOMY, HEMORRHOIDAL LIGATION/PEXY;  Surgeon: Michael Boston, MD;  Location: [facility];  Service: General;  Laterality: N/A;  GENERAL AND LOCAL   POLYPECTOMY     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   Procedure: TOTAL HIP ARTHROPLASTY ANTERIOR APPROACH;  Surgeon: Rod Can, MD;  Location: [place];  Service: Orthopedics;  Laterality: Left;    MEDICATIONS: Current Outpatient Medications on File Prior to Visit  Medication Sig Dispense Refill   amLODipine (NORVASC) 5 MG tablet Take 1 tablet (5 mg total) by mouth daily. 90 tablet 3   aspirin EC 81 MG EC tablet Take 1 tablet (81 mg total) by mouth 2 (two) times daily with a meal. 68 tablet 0   fluticasone (FLONASE) 50 MCG/ACT nasal spray USE 2 SPRAYS IN BOTH  NOSTRILS DAILY (Patient taking differently: Place 2 sprays into both nostrils daily as needed for allergies. ) 48 g 1   furosemide (LASIX) 20 MG tablet TAKE 1 TABLET EACH DAY. (Patient taking differently: Take 20 mg by mouth daily. ) 90 tablet 0   losartan (COZAAR) 100 MG tablet TAKE 1 TABLET BY MOUTH   DAILY 90 tablet 2   LUTEIN PO Take 1 capsule by mouth daily.      Multiple Vitamins-Minerals (CENTRUM PO) Take 1 tablet by mouth daily.      Multiple Vitamins-Minerals (PRESERVISION AREDS 2 PO) Take 1 capsule by mouth every morning.      polyvinyl alcohol (LIQUIFILM TEARS) 1.4 % ophthalmic solution Place 1 drop into both eyes daily as needed for dry eyes.     Saw Palmetto 450 MG CAPS Take 450 mg by mouth daily.      tamsulosin (FLOMAX) 0.4 MG CAPS capsule Take 1 capsule (0.4 mg total) by mouth daily. 90 capsule 3   vitamin C (ASCORBIC ACID) 500 MG tablet Take 500 mg by mouth daily.     No current facility-administered medications on file prior to visit.     ALLERGIES: No Known Allergies  FAMILY HISTORY: Family History  Problem Relation Age of Onset   Heart attack Father        early 76s   Diabetes Father    Diabetes Mother    Liver cancer Maternal Aunt    Stroke  Neg Hx    COPD Neg Hx    Colon cancer Neg Hx    Esophageal cancer Neg Hx    Rectal cancer Neg Hx    Stomach cancer Neg Hx    Asthma Neg Hx        Current Outpatient Medications on File Prior to Visit  Medication Sig Dispense Refill   amLODipine (NORVASC) 5 MG tablet Take 1 tablet (5 mg total) by mouth daily. 90 tablet 3   aspirin EC 81 MG EC tablet Take 1 tablet (81 mg total) by mouth 2 (two) times daily with a meal. 68 tablet 0   fluticasone (FLONASE) 50 MCG/ACT nasal spray USE 2 SPRAYS IN BOTH  NOSTRILS DAILY (Patient taking differently: Place 2 sprays into both nostrils daily as needed for allergies. ) 48 g 1   furosemide (LASIX) 20 MG tablet TAKE 1 TABLET EACH DAY. (Patient taking differently: Take 20 mg by mouth daily. ) 90 tablet 0   losartan (COZAAR) 100 MG tablet TAKE 1 TABLET BY MOUTH  DAILY 90 tablet 2   LUTEIN PO Take 1 capsule by mouth daily.      Multiple Vitamins-Minerals (CENTRUM PO) Take 1 tablet by mouth daily.      Multiple Vitamins-Minerals (PRESERVISION AREDS 2 PO) Take  1 capsule by mouth every morning.      polyvinyl alcohol (LIQUIFILM TEARS) 1.4 % ophthalmic solution Place 1 drop into both eyes daily as needed for dry eyes.     Saw Palmetto 450 MG CAPS Take 450 mg by mouth daily.      tamsulosin (FLOMAX) 0.4 MG CAPS capsule Take 1 capsule (0.4 mg total) by mouth daily. 90 capsule 3   vitamin C (ASCORBIC ACID) 500 MG tablet Take 500 mg by mouth daily.     No current facility-administered medications on file prior to visit.      Observations/Objective:   Vitals:   01/23/19 0838  Weight: 137 lb (62.1 kg)  Height: 6' (1.829 m)   GEN:  The patient appears stated age and is in NAD.  Neurological examination: Patient is awake, alert, oriented x 3. No aphasia or dysarthria. Intact fluency and comprehension. Remote and recent memory intact. Able to name and repeat. Cranial nerves: Extraocular movements intact with no nystagmus. No facial asymmetry. Motor: moves all extremities symmetrically, at least anti-gravity x 4. No incoordination on finger to nose testing. Gait: narrow-based and steady, able to tandem walk adequately. Negative Romberg test.  Montreal Cognitive Assessment Blind 01/23/2019  Attention: Read list of digits (0/2) 2  Attention: Read list of letters (0/1) 1  Attention: Serial 7 subtraction starting at 100 (0/3) 3  Language: Repeat phrase (0/2) 2  Language : Fluency (0/1) 0  Abstraction (0/2) 1  Delayed Recall (0/5) 5  Orientation (0/6) 6  Total 20/22    Assessment and Plan:   This is a pleasant 73 year old right-handed man with a history of hypertension, macular degeneration, prior fall with traumatic pneumothorax, recurrent falls in the past, presenting for evaluation of memory loss. Concern was raised after significant delirium the next morning after he came in for a left hip fracture. Symptoms resolved once back home. History consistent with hospital delirium. He may have some mild cognitive impairment, but he is able to do complex  tasks and does not fit criteria for dementia currently. MOCA blind (done over phone) today normal 20/22. We discussed different causes of memory loss, check TSH, B12. MRI brain without contrast will be done  to assess for underlying structural abnormality and assess vascular load. He will be scheduled for Neuropsychological testing to further evaluate cognitive status. We discussed the importance of control of vascular risk factors, physical exercise, and brain stimulation exercises for brain health. Follow-up in 6 months, he knows to call for any changes.   Follow Up Instructions:   -I discussed the assessment and treatment plan with the patient. The patient was provided an opportunity to ask questions and all were answered. The patient agreed with the plan and demonstrated an understanding of the instructions.   The patient was advised to call back or seek an in-person evaluation if the symptoms worsen or if the condition fails to improve as anticipated.    Cameron Sprang, MD

## 2019-01-23 NOTE — Telephone Encounter (Signed)
Received fax from [facility] and they do not have any records on this pt.  Dr. Delice Lesch, was this the right facility?

## 2019-01-30 DIAGNOSIS — S72032D Displaced midcervical fracture of left femur, subsequent encounter for closed fracture with routine healing: Secondary | ICD-10-CM | POA: Diagnosis not present

## 2019-02-09 ENCOUNTER — Telehealth: Payer: Self-pay | Admitting: Neurology

## 2019-02-09 ENCOUNTER — Ambulatory Visit
Admission: RE | Admit: 2019-02-09 | Discharge: 2019-02-09 | Disposition: A | Discharge status: Home / Self Care | Payer: Medicare Other | Source: Ambulatory Visit | Attending: Neurology | Admitting: Neurology

## 2019-02-09 ENCOUNTER — Other Ambulatory Visit (INDEPENDENT_AMBULATORY_CARE_PROVIDER_SITE_OTHER): Payer: Medicare Other

## 2019-02-09 ENCOUNTER — Other Ambulatory Visit: Payer: Self-pay

## 2019-02-09 DIAGNOSIS — G3184 Mild cognitive impairment, so stated: Secondary | ICD-10-CM

## 2019-02-09 NOTE — Telephone Encounter (Signed)
Informed wife that we will call with MRI results  F/U is in March  Come to the office and check in for labs   Our office will call in regards to Neurocognitive testing with Dr. Melvyn Novas. Message sent to [facility] to make sure pt was in que to call.

## 2019-02-09 NOTE — Telephone Encounter (Signed)
Patient wife called and states that he is having his MR today and would like to know if Delice Lesch would like to see him after the MR

## 2019-02-10 LAB — TSH: TSH: 2.38 mIU/L (ref 0.40–4.50)

## 2019-02-10 LAB — VITAMIN B12: Vitamin B-12: 354 pg/mL (ref 200–1100)

## 2019-02-12 ENCOUNTER — Telehealth: Payer: Self-pay | Admitting: Neurology

## 2019-02-12 NOTE — Telephone Encounter (Signed)
Patient wife called to get the lab results

## 2019-02-13 NOTE — Telephone Encounter (Signed)
Left message informing wife of normal TSH and B12 lab results

## 2019-02-22 ENCOUNTER — Telehealth: Payer: Self-pay | Admitting: Neurology

## 2019-02-22 NOTE — Telephone Encounter (Signed)
Patient wife wants a call about the MRI results

## 2019-03-19 ENCOUNTER — Encounter: Payer: Self-pay | Admitting: Psychology

## 2019-03-19 ENCOUNTER — Ambulatory Visit: Payer: Medicare Other | Admitting: Psychology

## 2019-03-19 ENCOUNTER — Ambulatory Visit: Payer: Medicare Other

## 2019-03-19 ENCOUNTER — Other Ambulatory Visit: Payer: Self-pay

## 2019-03-19 DIAGNOSIS — G3184 Mild cognitive impairment, so stated: Secondary | ICD-10-CM

## 2019-03-19 NOTE — Progress Notes (Signed)
   Neuropsychology Note   Hunter Boone completed 90 minutes of neuropsychological testing with technician, Cruzita Lederer, B.S., under the supervision of Dr. Christia Reading, Ph.D., licensed neuropsychologist. The patient did not appear overtly distressed by the testing session, per behavioral observation or via self-report to the technician. Rest breaks were offered.    In considering the patient's current level of functioning, level of presumed impairment, nature of symptoms, emotional and behavioral responses during the interview, level of literacy, and observed level of motivation/effort, a battery of tests was selected and communicated to the psychometrician.   Communication between the psychologist and technician was ongoing throughout the testing session and changes were made as deemed necessary based on patient performance on testing, technician observations and additional pertinent factors such as those listed above.   Hunter Boone will return within approximately two weeks for an interactive feedback session with Dr. Melvyn Novas at which time his test performances, clinical impressions, and treatment recommendations will be reviewed in detail. The patient understands he can contact our office should he require our assistance before this time.   Full report to follow.  90 minutes were spent face-to-face with patient administering standardized tests and 15 minutes were spent scoring (technician). [CPT [id number], [id number]
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
## 2019-03-20 DIAGNOSIS — G3184 Mild cognitive impairment, so stated: Secondary | ICD-10-CM | POA: Insufficient documentation

## 2019-03-20 DIAGNOSIS — F0390 Unspecified dementia without behavioral disturbance: Secondary | ICD-10-CM | POA: Insufficient documentation

## 2019-03-27 ENCOUNTER — Encounter: Payer: Medicare Other | Admitting: Psychology

## 2019-03-28 DIAGNOSIS — H5213 Myopia, bilateral: Secondary | ICD-10-CM | POA: Diagnosis not present

## 2019-03-28 DIAGNOSIS — H25093 Other age-related incipient cataract, bilateral: Secondary | ICD-10-CM | POA: Diagnosis not present

## 2019-03-29 ENCOUNTER — Telehealth: Payer: Self-pay | Admitting: Neurology

## 2019-03-29 NOTE — Telephone Encounter (Signed)
Left message to return call 

## 2019-03-29 NOTE — Telephone Encounter (Signed)
Wife informed of results. 

## 2019-03-29 NOTE — Telephone Encounter (Signed)
Returning [place] call per the VM

## 2019-03-29 NOTE — Telephone Encounter (Signed)
Patient's wife called back again needing to get her husbands MRI results. Please Call. Thanks

## 2019-03-30 ENCOUNTER — Other Ambulatory Visit: Payer: Self-pay | Admitting: Internal Medicine

## 2019-04-02 ENCOUNTER — Other Ambulatory Visit: Payer: Self-pay

## 2019-04-02 MED ORDER — FUROSEMIDE 20 MG PO TABS: 20.0000 mg | ORAL_TABLET | Freq: Every day | ORAL | 0 refills | Status: DC

## 2019-04-06 ENCOUNTER — Other Ambulatory Visit: Payer: Self-pay

## 2019-04-06 ENCOUNTER — Ambulatory Visit: Payer: Medicare Other | Admitting: Psychology

## 2019-04-06 ENCOUNTER — Encounter: Payer: Self-pay | Admitting: Psychology

## 2019-04-06 DIAGNOSIS — G3184 Mild cognitive impairment, so stated: Secondary | ICD-10-CM

## 2019-04-06 MED ORDER — AMLODIPINE BESYLATE 5 MG PO TABS: 5.0000 mg | ORAL_TABLET | Freq: Every day | ORAL | 0 refills | Status: DC

## 2019-04-06 NOTE — Progress Notes (Signed)
   Neuropsychology Feedback Session Hunter Boone. [facility] Neurology  Reason for Referral:   Hunter Boone a 73 y.o. Caucasian male referred by [name], M.D.,to characterize hiscurrent cognitive functioning and assist with diagnostic clarity and treatment planning in the context of subjective cognitive decline and an episode of delirium during a previous hospitalization in July 2020.  Feedback:   Hunter Boone completed a comprehensive neuropsychological evaluation on 03/19/2019. Please refer to that encounter for the full report. Briefly, results suggested primary impairments across semantic fluency and both encoding and retrieval aspects of memory. Additional performance variability was exhibited across domains of processing speed, executive functioning, and consolidation aspects of memory. 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 Regarding etiology, I have concerns regarding Alzheimer's disease given his amnestic memory profile and variable performance across recognition tasks. Low performance across semantic fluency, especially relative to phonemic fluency being within normal limits, is also consistent with this presentation. However, performance across other domains commonly implicated in Alzheimer's disease (e.g., confrontation naming and visuospatial abilities) were within normal limits. This is atypical of this presentation and could suggest Hunter Boone being early in this disease process. Recommendations included following up with Dr. Delice Lesch for medication adjustments and a repeat evaluation in 12-18 months.  Hunter Boone was accompanied by his wife. Content of the current session focused on the results of the current evaluation and concerns surrounding a neurodegenerative illness. Hunter Boone  and his wife were given the opportunity to ask questions and their questions were answered. He was also encouraged to reach out should additional questions arise. A copy of his report was provided at the conclusion of the visit.     A total of 25 minutes were spent with Hunter Boone during the current feedback session.

## 2019-04-18 ENCOUNTER — Telehealth: Payer: Self-pay | Admitting: Internal Medicine

## 2019-04-18 DIAGNOSIS — R911 Solitary pulmonary nodule: Secondary | ICD-10-CM

## 2019-04-18 NOTE — Telephone Encounter (Signed)
He is due for a chest of his chest to f/u his lung nodule - please let him know I have ordered this and someone will call him to schedule this.

## 2019-04-20 NOTE — Telephone Encounter (Signed)
Pt is aware.  

## 2019-05-07 ENCOUNTER — Ambulatory Visit (INDEPENDENT_AMBULATORY_CARE_PROVIDER_SITE_OTHER)
Admission: RE | Admit: 2019-05-07 | Discharge: 2019-05-07 | Disposition: A | Discharge status: Home / Self Care | Payer: Medicare Other | Source: Ambulatory Visit | Attending: Internal Medicine | Admitting: Internal Medicine

## 2019-05-07 ENCOUNTER — Other Ambulatory Visit: Payer: Self-pay

## 2019-05-07 DIAGNOSIS — R911 Solitary pulmonary nodule: Secondary | ICD-10-CM

## 2019-05-14 ENCOUNTER — Encounter: Payer: Self-pay | Admitting: Neurology

## 2019-05-14 ENCOUNTER — Other Ambulatory Visit: Payer: Self-pay

## 2019-05-14 ENCOUNTER — Telehealth (INDEPENDENT_AMBULATORY_CARE_PROVIDER_SITE_OTHER): Payer: Medicare Other | Admitting: Neurology

## 2019-05-14 VITALS — Ht 72.0 in | Wt 135.0 lb

## 2019-05-14 DIAGNOSIS — G3184 Mild cognitive impairment, so stated: Secondary | ICD-10-CM

## 2019-05-14 MED ORDER — DONEPEZIL HCL 5 MG PO TABS: ORAL_TABLET | ORAL | 11 refills | Status: DC

## 2019-05-14 NOTE — Progress Notes (Signed)
Virtual Visit via Video Note The purpose of this virtual visit is to provide medical care while limiting exposure to the novel coronavirus.    Consent was obtained for video visit:  Yes.   Answered questions that patient had about telehealth interaction:  Yes.   I discussed the limitations, risks, security and privacy concerns of performing an evaluation and management service by telemedicine. I also discussed with the patient that there may be a patient responsible charge related to this service. The patient expressed understanding and agreed to proceed.  Pt location: Home Physician Location: office Name of referring provider:  Binnie Rail, MD I connected with Hunter Boone at patients initiation/request on 05/14/2019 at  9:30 AM EST by video enabled telemedicine application and verified that I am speaking with the correct person using two identifiers. [name]:  [id number] Pt DOB:  07/19/1945 Video Participants:  Hunter Boone;  Hunter Boone (spouse)   History of Present Illness:  The patient was seen as a virtual video visit on 05/14/2019. He was last seen 3 months ago for memory loss. Records were reviewed. I personally reviewed MRI brain without contrast done 01/2019 which did not show any acute changes. There was mild diffuse atrophy and mild chronic microvascular disease. He underwent Neuropsychological testing last October 2020 with results indicating Mild Neurocognitive Disorder (mild cognitive impairment). He had primary impairments across semantic fluency and both encoding and retrieval aspects of memory,as well as processing speed, executive functioning, and consolidation aspects of memory. Concerns was raised regarding Alzheimer's disease, given his amnestic memory profile and low performance across semantic fluency, however he did well with other domains commonly implicated in Alzheimer's disease, suggesting he is early in the disease process. Findings and questions were reviewed  today. Since his last visit, he and his wife report symptoms are unchanged. His wife continues to report that he repeats himself after several minutes. He remains independent with managing finances and medications. He denies getting lost driving. 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 He denies any headaches, dizziness, focal numbness/tingling/weakness, no falls.    History on Initial Assessment 01/23/2019: This is a pleasant 73 year old right-handed man with a history of hypertension, macular degeneration, prior fall with traumatic pneumothorax, recurrent falls in the past, presenting for evaluation of memory loss. He feels his memory is a "7 or 8 out of 10." He started noticing changes several years ago, forgetting dates more than anything else. He lives with his wife Hunter Boone who is present to provide additional information. She started noticing short-term memory changes a few years ago where he would ask the same questions. He however remained pretty good with keeping track of finances and managing his own medications. She denied any driving concerns, he denies getting lost driving. Concern regarding his cognitive status occurred due to significant delirium during his hospitalization last 12/17/2018. He fell on his left side while cleaning out a storage space and sustained a left closed femoral neck fracture. There was no loss of consciousness, he was brought by EMS to the ER on the evening of 7/5. Records were reviewed. It appears he received fentanyl that evening. He was scheduled for surgery the next day but on 7/6 he became confused and refused surgery. Concern was raised for possible alcohol use and risk of withdrawals, he had mentioned having a few drinks at night, his wife denied significant alcohol use. He was oriented only to self and was unable to give consent for surgery, combative,  removing his IV. He kept saying he was having a haircut and wanted to leave. His wife and son tried to convince  him on the phone but he was adamant and wanted to have another opinion.  He became very agitated in the evening and needed a sitter. He had a head CT with no acute changes, mild diffuse atrophy and chronic microvascular disease. Bloodwork unremarkable. On 7/7, his wife came to the hospital and tried to convince him, surgery was done that day, it was noted he was calm with no recollection of events from the day prior. He remained confused the day after, thinking he was in [place], [place], wanting to go out and smoke a cigarette, and was given IV Haldol. He was evaluated by Speech Therapy with a MOCA score of 15/30. He was discharged home the day after surgery with home PT. He was calm once he got home. No further confusion since then. His wife reports that he is "the most calm man ever" and works as a Community education officer. No prior similar confusional episodes with surgeries in the past. No paranoia or hallucinations. His wife called our office to report that he had a Cognitive and Communications Exam at [facility] (report unavailable for review). Per wife, results showed RAW 78, Oral Expression 89, Orientation 83, Speech Comprehension 92, Reading Comprehension 100, Writing 100, Attention 88, Problem Solving 96, Memory 37. All our of 100 range.   He has a strong family history of dementia in both parents and his sister. He was knocked out one time as a Company secretary in [place], no neurosurgical procedures. He drinks a couple of ounces a day of bourbon for several years. Mood and sleep are good. He denies any headaches, dizziness, diplopia, dysarthria, dysphagia, neck/back pain, focal numbness/tingling/weakness, bowel/bladder dysfunction. No anosmia, tremors, no further falls. His hip is not bothering him.    Current Outpatient Medications on File Prior to Visit  Medication Sig Dispense Refill   amLODipine (NORVASC) 5 MG tablet Take 1 tablet (5 mg total) by mouth daily. 90 tablet 0   fluticasone (FLONASE) 50  MCG/ACT nasal spray USE 2 SPRAYS IN BOTH  NOSTRILS DAILY (Patient taking differently: Place 2 sprays into both nostrils daily as needed for allergies. ) 48 g 1   furosemide (LASIX) 20 MG tablet Take 1 tablet (20 mg total) by mouth daily. 90 tablet 0   losartan (COZAAR) 100 MG tablet TAKE 1 TABLET BY MOUTH  DAILY 90 tablet 2   LUTEIN PO Take 1 capsule by mouth daily.      Multiple Vitamins-Minerals (CENTRUM PO) Take 1 tablet by mouth daily.      Multiple Vitamins-Minerals (PRESERVISION AREDS 2 PO) Take 1 capsule by mouth every morning.      polyvinyl alcohol (LIQUIFILM TEARS) 1.4 % ophthalmic solution Place 1 drop into both eyes daily as needed for dry eyes.     Saw Palmetto 450 MG CAPS Take 450 mg by mouth daily.      tamsulosin (FLOMAX) 0.4 MG CAPS capsule Take 1 capsule (0.4 mg total) by mouth daily. 90 capsule 3   vitamin C (ASCORBIC ACID) 500 MG tablet Take 500 mg by mouth daily.     No current facility-administered medications on file prior to visit.      Observations/Objective:   Vitals:   05/14/19 0856  Weight: 135 lb (61.2 kg)  Height: 6' (1.829 m)   GEN:  The patient appears stated age and is in NAD.  Neurological examination: Patient  is awake, alert, oriented x 3. No aphasia or dysarthria. Intact fluency and comprehension. Remote and recent memory intact. Able to name and repeat. Cranial nerves: Extraocular movements intact with no nystagmus. No facial asymmetry. Motor: moves all extremities symmetrically, at least anti-gravity x 4.    Assessment and Plan:   This is a pleasant 73 yo RH man with a history of hypertension, macular degeneration, prior fall with traumatic pneumothorax, recurrent falls in the past, with memory loss. MRI brain unremarkable. Neuropsychological testing indicated Mild Neurocognitive Disorder (mild cognitive impairment), with concern raised for early Alzheimer's disease. Findings and recommendations were discussed at length today, we discussed the  importance of control of vascular risk factors, physical exercise, and brain stimulation exercises for brain health. We discussed recommendation to start medications used in dementia, he is agreeable to starting Donepezil 5mg , take 1/2 tab daily for 2 weeks, then increase to 1 tablet daily. Side effects and expectations from medication discussed. Repeat Neuropsychological testing will be done in a year. Follow-up as scheduled in April 2020, they know to call for any changes.    Follow Up Instructions:   -I discussed the assessment and treatment plan with the patient. The patient was provided an opportunity to ask questions and all were answered. The patient agreed with the plan and demonstrated an understanding of the instructions.   The patient was advised to call back or seek an in-person evaluation if the symptoms worsen or if the condition fails to improve as anticipated.     Cameron Sprang, MD

## 2019-05-21 ENCOUNTER — Other Ambulatory Visit: Payer: Self-pay | Admitting: Internal Medicine

## 2019-08-04 ENCOUNTER — Ambulatory Visit: Payer: Medicare Other | Attending: Internal Medicine

## 2019-08-04 DIAGNOSIS — Z23 Encounter for immunization: Secondary | ICD-10-CM

## 2019-08-04 NOTE — Progress Notes (Signed)
   Covid-19 Vaccination Clinic  Name:  LOUISE MCCULLAGH    MRN: [id number] DOB: 01/20/1946  08/04/2019  Mr. Bondurant was observed post Covid-19 immunization for 15 minutes without incidence. He was provided with Vaccine Information Sheet and instruction to access the V-Safe system.   Mr. Faatz was instructed to call 911 with any severe reactions post vaccine: [name] Difficulty breathing  . Swelling of your face and throat  . A fast heartbeat  . A bad rash all over your body  . Dizziness and weakness    Immunizations Administered    Name Date Dose VIS Date Route   Pfizer COVID-19 Vaccine 08/04/2019  9:27 AM 0.3 mL 05/25/2019 Intramuscular   Manufacturer: [place]   Lot: [id number]   [place]: [id number]

## 2019-08-08 ENCOUNTER — Other Ambulatory Visit: Payer: Self-pay | Admitting: Internal Medicine

## 2019-08-26 DIAGNOSIS — I700 Atherosclerosis of aorta: Secondary | ICD-10-CM | POA: Insufficient documentation

## 2019-08-26 NOTE — Patient Instructions (Addendum)
  Blood work was ordered.     Medications reviewed and updated.  Changes include :   Change lasix to daily as needed.  crestor 5 mg daily  Your prescription(s) have been submitted to your pharmacy. Please take as directed and contact our office if you believe you are having problem(s) with the medication(s).    Please followup in 6 months

## 2019-08-26 NOTE — Progress Notes (Signed)
Subjective:    Patient ID: Hunter Boone, male    DOB: 02/21/1946, 74 y.o.   MRN: [id number]  HPI The patient is here for follow up of their chronic medical problems, including hypertension, COPD, prediabetes, tobacco abuse, BPH mild cognitive dysfunction.  His wife is here with him.  He is still smoking.  He does want to quit and he knows he needs to quit.  He is not exercising regularly.  He is eating a healthy diet.   He has no concerns.  Medications and allergies reviewed with patient and updated if appropriate.  Patient Active Problem List   Diagnosis Date Noted  . Aortic atherosclerosis ([place]) 08/26/2019  . Mild neurocognitive disorder 03/20/2019  . Leukocytosis 12/27/2018  . Elevated PSA 08/16/2017  . Bilateral leg edema 07/29/2017  . Nocturnal enuresis 07/29/2017  . Prediabetes 02/01/2017  . COPD (chronic obstructive pulmonary disease) with emphysema ([place]) 06/13/2016  . Lung nodule-repeat CT due 04/2020 05/25/2016  . Pityriasis rosea 08/22/2015  . Nonspecific abnormal electrocardiogram (ECG) (EKG) 10/25/2012  . 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 Diverticulosis of colon 04/10/2009  . Tobacco use 01/26/2008  . Essential hypertension 01/26/2008    Current Outpatient Medications on File Prior to Visit  Medication Sig Dispense Refill  . amLODipine (NORVASC) 5 MG tablet TAKE 1 TABLET BY MOUTH  DAILY 90 tablet 3  . donepezil (ARICEPT) 5 MG tablet Take 1/2 tablet daily for 2 weeks, then increase to 1 tablet daily and continue 30 tablet 11  . fluticasone (FLONASE) 50 MCG/ACT nasal spray Place 2 sprays into both nostrils daily as needed for allergies. 48 g 1  . losartan (COZAAR) 100 MG tablet TAKE 1 TABLET BY MOUTH  DAILY 90 tablet 2  . LUTEIN PO Take 1 capsule by mouth daily.     . Multiple Vitamins-Minerals (CENTRUM PO) Take 1 tablet by mouth daily.     . Multiple Vitamins-Minerals (PRESERVISION AREDS 2 PO) Take 1 capsule by mouth every morning.     . polyvinyl  alcohol (LIQUIFILM TEARS) 1.4 % ophthalmic solution Place 1 drop into both eyes daily as needed for dry eyes.    . Saw Palmetto 450 MG CAPS Take 450 mg by mouth daily.     . tamsulosin (FLOMAX) 0.4 MG CAPS capsule Take 1 capsule (0.4 mg total) by mouth daily. 90 capsule 3  . vitamin C (ASCORBIC ACID) 500 MG tablet Take 500 mg by mouth daily.     No current facility-administered medications on file prior to visit.    Past Medical History:  Diagnosis Date  . Colonic polyps 04/10/2009   Qualifier: Diagnosis of  By: Fuller Plan MD Lamont Snowball T  Tubular adenoma 2009 , 2011, & 2014 ( tubular adenomatous fragments) Dr Layne Benton [facility]     . Hypertension   . Internal hemorrhoids   . Macular degeneration   . Tubular adenoma of colon     Past Surgical History:  Procedure Laterality Date  . adenomatous polyps  2009, 2011,2014    Dr Fuller Plan;   . COLONOSCOPY    . EVALUATION UNDER ANESTHESIA WITH HEMORRHOIDECTOMY N/A 07/01/2017   Procedure: ANORECTAL EXAMINATION UNDER ANESTHESIA, HEMORRHOIDECTOMY, HEMORRHOIDAL LIGATION/PEXY;  Surgeon: Michael Boston, MD;  Location: [facility];  Service: General;  Laterality: N/A;  GENERAL AND LOCAL  . POLYPECTOMY    . 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 Marital status: Married    Spouse name: Not on file  . Number of children: Not on file  . Years of education: 12  . Highest education level: Associate degree: academic program  Occupational History  . Occupation: Company secretary  Tobacco Use  . Smoking status: Current Some Day Smoker    Packs/day: 1.00    Types: Cigarettes  . Smokeless tobacco: Never Used  Substance and Sexual Activity  . Alcohol use: Yes    Alcohol/week: 4.0 - 7.0 standard drinks    Types: 4 - 7 Shots of liquor per week    Comment: 1 drink several times per  week  . Drug use: No  . Sexual activity: Not on file  Other Topics Concern  . Not on file  Social History Narrative   Lives with wife in a split level home      Right handed      Highest Level of edu- some college   Social Determinants of Health   Financial Resource Strain:   . Difficulty of Paying Living Expenses:   Food Insecurity:   . Worried About Charity fundraiser in the Last Year:   . Arboriculturist in the Last Year:   Transportation Needs:   . Film/video editor (Medical):   [name] Lack of Transportation (Non-Medical):   Physical Activity:   . Days of Exercise per Week:   . Minutes of Exercise per Session:   Stress:   . Feeling of Stress :   Social Connections:   . Frequency of Communication with Friends and Family:   . Frequency of Social Gatherings with Friends and Family:   . Attends Religious Services:   . Active Member of Clubs or Organizations:   . Attends Archivist Meetings:   [name] Marital Status:     Family History  Problem Relation Age of Onset  . Heart attack Father        Early 67s  . Diabetes Father   . Dementia Father        Unspecified type  . Diabetes Mother   . Dementia Mother        Unspecified type, said to be early-stages  . Liver cancer Maternal Aunt   . Alzheimer's disease Sister        Symptom onset in mid to late 60s  . Stroke Neg Hx   . COPD Neg Hx   . Colon cancer Neg Hx   . Esophageal cancer Neg Hx   . Rectal cancer Neg Hx   . Stomach cancer Neg Hx   . Asthma Neg Hx     Review of Systems  Constitutional: Negative for chills and fever.  Respiratory: Negative for cough, shortness of breath and wheezing.   Cardiovascular: Negative for chest pain, palpitations and leg swelling.  Genitourinary: Negative for difficulty urinating.  Neurological: Negative for dizziness, light-headedness and headaches.  Psychiatric/Behavioral: Negative for dysphoric mood. The patient is not nervous/anxious.        Objective:    Vitals:   08/27/19 0949  BP: 138/70  Pulse: 69  Resp: 16  Temp: 98.1 F (36.7 C)  SpO2: 95%   BP Readings from Last 3 Encounters:  08/27/19 138/70  12/27/18 140/76  12/20/18 130/69   Wt Readings from Last 3 Encounters:  08/27/19 131 lb 6.4 oz (59.6 kg)  05/14/19 135 lb (61.2 kg)  01/23/19 137 lb (62.1 kg)  Body mass index is 17.82 kg/m.   Physical Exam    Constitutional: Appears well-developed and well-nourished. No distress.  HENT:  Head: Normocephalic and atraumatic.  Neck: Neck supple. No tracheal deviation present. No thyromegaly present.  No cervical lymphadenopathy Cardiovascular: Normal rate, regular rhythm and normal heart sounds.   No murmur heard. No carotid bruit .  No edema Pulmonary/Chest: Effort normal and breath sounds normal. No respiratory distress. No has no wheezes. No rales.  Skin: Skin is warm and dry. Not diaphoretic.  Psychiatric: Normal mood and affect. Behavior is normal.      Assessment & Plan:    See Problem List for Assessment and Plan of chronic medical problems.    This visit occurred during the SARS-CoV-2 public health emergency.  Safety protocols were in place, including screening questions prior to the visit, additional usage of staff PPE, and extensive cleaning of exam room while observing appropriate contact time as indicated for disinfecting solutions.

## 2019-08-27 ENCOUNTER — Other Ambulatory Visit: Payer: Self-pay

## 2019-08-27 ENCOUNTER — Ambulatory Visit (INDEPENDENT_AMBULATORY_CARE_PROVIDER_SITE_OTHER): Payer: Medicare Other | Admitting: Internal Medicine

## 2019-08-27 ENCOUNTER — Encounter: Payer: Self-pay | Admitting: Internal Medicine

## 2019-08-27 VITALS — BP 138/70 | HR 69 | Temp 98.1°F | Resp 16 | Ht 72.0 in | Wt 131.4 lb

## 2019-08-27 DIAGNOSIS — N401 Enlarged prostate with lower urinary tract symptoms: Secondary | ICD-10-CM

## 2019-08-27 DIAGNOSIS — I700 Atherosclerosis of aorta: Secondary | ICD-10-CM

## 2019-08-27 DIAGNOSIS — R7303 Prediabetes: Secondary | ICD-10-CM

## 2019-08-27 DIAGNOSIS — I10 Essential (primary) hypertension: Secondary | ICD-10-CM | POA: Diagnosis not present

## 2019-08-27 DIAGNOSIS — N138 Other obstructive and reflux uropathy: Secondary | ICD-10-CM

## 2019-08-27 DIAGNOSIS — F17210 Nicotine dependence, cigarettes, uncomplicated: Secondary | ICD-10-CM

## 2019-08-27 DIAGNOSIS — J439 Emphysema, unspecified: Secondary | ICD-10-CM

## 2019-08-27 DIAGNOSIS — D72829 Elevated white blood cell count, unspecified: Secondary | ICD-10-CM

## 2019-08-27 DIAGNOSIS — Z720 Tobacco use: Secondary | ICD-10-CM

## 2019-08-27 DIAGNOSIS — G3184 Mild cognitive impairment, so stated: Secondary | ICD-10-CM | POA: Diagnosis not present

## 2019-08-27 DIAGNOSIS — R600 Localized edema: Secondary | ICD-10-CM

## 2019-08-27 LAB — COMPREHENSIVE METABOLIC PANEL
ALT: 14 U/L (ref 0–53)
AST: 19 U/L (ref 0–37)
Albumin: 4.1 g/dL (ref 3.5–5.2)
Alkaline Phosphatase: 64 U/L (ref 39–117)
BUN: 18 mg/dL (ref 6–23)
CO2: 26 mEq/L (ref 19–32)
Calcium: 9.6 mg/dL (ref 8.4–10.5)
Chloride: 100 mEq/L (ref 96–112)
Creatinine, Ser: 0.99 mg/dL (ref 0.40–1.50)
GFR: 73.88 mL/min (ref >60.00)
Glucose, Bld: 95 mg/dL (ref 70–99)
Potassium: 3.9 mEq/L (ref 3.5–5.1)
Sodium: 136 mEq/L (ref 135–145)
Total Bilirubin: 0.7 mg/dL (ref 0.2–1.2)
Total Protein: 7.7 g/dL (ref 6.0–8.3)

## 2019-08-27 LAB — LIPID PANEL
Cholesterol: 174 mg/dL (ref 0–200)
HDL: 75.6 mg/dL (ref >39.00)
LDL Cholesterol: 80 mg/dL (ref 0–99)
NonHDL: 97.95
Total CHOL/HDL Ratio: 2
Triglycerides: 88 mg/dL (ref 0.0–149.0)
VLDL: 17.6 mg/dL (ref 0.0–40.0)

## 2019-08-27 LAB — CBC WITH DIFFERENTIAL/PLATELET
Basophils Absolute: 0.1 10*3/uL (ref 0.0–0.1)
Basophils Relative: 0.9 % (ref 0.0–3.0)
Eosinophils Absolute: 0.2 10*3/uL (ref 0.0–0.7)
Eosinophils Relative: 2.6 % (ref 0.0–5.0)
HCT: 42.2 % (ref 39.0–52.0)
Hemoglobin: 14.2 g/dL (ref 13.0–17.0)
Lymphocytes Relative: 20.8 % (ref 12.0–46.0)
Lymphs Abs: 1.8 10*3/uL (ref 0.7–4.0)
MCHC: 33.5 g/dL (ref 30.0–36.0)
MCV: 96.1 fl (ref 78.0–100.0)
Monocytes Absolute: 0.8 10*3/uL (ref 0.1–1.0)
Monocytes Relative: 8.9 % (ref 3.0–12.0)
Neutro Abs: 5.8 10*3/uL (ref 1.4–7.7)
Neutrophils Relative %: 66.8 % (ref 43.0–77.0)
Platelets: 264 10*3/uL (ref 150.0–400.0)
RBC: 4.39 Mil/uL (ref 4.22–5.81)
RDW: 13.7 % (ref 11.5–15.5)
WBC: 8.7 10*3/uL (ref 4.0–10.5)

## 2019-08-27 LAB — HEMOGLOBIN A1C: Hgb A1c MFr Bld: 5.7 % (ref 4.6–6.5)

## 2019-08-27 MED ORDER — ROSUVASTATIN CALCIUM 5 MG PO TABS: 5.0000 mg | ORAL_TABLET | Freq: Every day | ORAL | 1 refills | Status: DC

## 2019-08-27 MED ORDER — FUROSEMIDE 20 MG PO TABS: 20.0000 mg | ORAL_TABLET | Freq: Every day | ORAL | 2 refills | Status: DC | PRN

## 2019-08-27 NOTE — Assessment & Plan Note (Addendum)
Smoking cessation was discussed for more than 3 minutes.  The patient was counseled on the dangers of tobacco use, and was advised to quit and wants to quit.  Reviewed ways of quitting smoking including nicotine replacement, vapping/e-cigarettes, cold [place], weaning off cigarettes, and pharmacotherapy (wellbutrin and chantix).  He does want to quit, and he is ready to try something.  He thinks e-cigarettes may work best for him and he plans on attempting that first.  Discussed that these are still not safe and if they help him wean off the tobacco that is 1 thing, but ideally does not want to stay on the e-cigarettes forever

## 2019-08-27 NOTE — Assessment & Plan Note (Signed)
Chronic Following with urology Denies any difficulty urinating-no obstructive symptoms Continue Flomax daily

## 2019-08-27 NOTE — Assessment & Plan Note (Signed)
Chronic He does have evidence of aortic atherosclerosis on prior imaging Cholesterol overall has been good, but he does smoke and has other risk factors for cardiovascular disease Discussed low-dose of statin for preventative reasons Start Crestor 5 mg daily

## 2019-08-27 NOTE — Assessment & Plan Note (Signed)
History leukocytosis CBC

## 2019-08-27 NOTE — Assessment & Plan Note (Signed)
Chronic Check a1c Low sugar / carb diet Stressed regular exercise  

## 2019-08-27 NOTE — Assessment & Plan Note (Signed)
Chronic Has been on Lasix 20 mg daily, but has not had any issues with edema We will change Lasix to 20 mg daily as needed only and if he does not end up needing it we will discontinue altogether CMP

## 2019-08-27 NOTE — Assessment & Plan Note (Signed)
Chronic, asymptomatic Denies any shortness of breath coughing or wheezing Smokes a pipe and he is interested in quitting-stressed smoking cessation

## 2019-08-27 NOTE — Assessment & Plan Note (Signed)
Chronic BP well controlled Current regimen effective and well tolerated Continue current medications at current doses cmp  

## 2019-08-27 NOTE — Assessment & Plan Note (Signed)
Following with neurology Wife states that his short-term memory is poor, but long-term memory is still good Fully independent in ADLs Taking Aricept 5 mg daily

## 2019-08-28 ENCOUNTER — Ambulatory Visit: Payer: Medicare Other | Attending: Internal Medicine

## 2019-08-28 DIAGNOSIS — Z23 Encounter for immunization: Secondary | ICD-10-CM

## 2019-08-28 NOTE — Progress Notes (Signed)
   Covid-19 Vaccination Clinic  Name:  Hunter Boone    MRN: [id number] DOB: Feb 04, 1946  08/28/2019  [name] was observed post Covid-19 immunization for 15 minutes without incident. He was provided with Vaccine Information Sheet and instruction to access the V-Safe system.   [name] was instructed to call 911 with any severe reactions post vaccine: [name] Difficulty breathing  . Swelling of face and throat  . A fast heartbeat  . A bad rash all over body  . Dizziness and weakness   Immunizations Administered    Name Date Dose VIS Date Route   Pfizer COVID-19 Vaccine 08/28/2019 10:21 AM 0.3 mL 05/25/2019 Intramuscular   Manufacturer: [place]   Lot: [id number]   [place]: [id number]

## 2019-09-05 ENCOUNTER — Other Ambulatory Visit: Payer: Self-pay | Admitting: Neurology

## 2019-09-05 ENCOUNTER — Other Ambulatory Visit: Payer: Self-pay

## 2019-09-05 ENCOUNTER — Ambulatory Visit: Payer: Medicare Other | Admitting: Neurology

## 2019-09-05 ENCOUNTER — Encounter: Payer: Self-pay | Admitting: Neurology

## 2019-09-05 VITALS — BP 138/72 | HR 72 | Ht 72.0 in | Wt 131.4 lb

## 2019-09-05 DIAGNOSIS — G3184 Mild cognitive impairment, so stated: Secondary | ICD-10-CM | POA: Diagnosis not present

## 2019-09-05 MED ORDER — DONEPEZIL HCL 10 MG PO TABS: ORAL_TABLET | ORAL | 3 refills | Status: DC

## 2019-09-05 NOTE — Patient Instructions (Signed)
1. With your current bottle of Donepezil 5mg : take 2 tablets daily (total dose 10mg ). Once finished, your new bottle will  be for Donepezil 10mg : take 1 tablet daily  2. Schedule repeat Neurocognitive testing for October 2021  3. Follow-up after testing, call for any changes  FALL PRECAUTIONS: Be cautious when walking. Scan the area for obstacles that may increase the risk of trips and falls. When getting up in the mornings, sit up at the edge of the bed for a few minutes before getting out of bed. Consider elevating the bed at the head end to avoid drop of blood pressure when getting up. Walk always in a well-lit room (use night lights in the walls). Avoid area rugs or power cords from appliances in the middle of the walkways. Use a walker or a cane if necessary and consider physical therapy for balance exercise. Get your eyesight checked regularly.  FINANCIAL OVERSIGHT: Supervision, especially oversight when making financial decisions or transactions is also recommended.  HOME SAFETY: Consider the safety of the kitchen when operating appliances like stoves, microwave oven, and blender. Consider having supervision and share cooking responsibilities until no longer able to participate in those. Accidents with firearms and other hazards in the house should be identified and addressed as well.  DRIVING: Regarding driving, in patients with progressive memory problems, driving will be impaired. We advise to have someone else do the driving if trouble finding directions or if minor accidents are reported. Independent driving assessment is available to determine safety of driving.  ABILITY TO BE LEFT ALONE: If patient is unable to contact 911 operator, consider using LifeLine, or when the need is there, arrange for someone to stay with patients. Smoking is a fire hazard, consider supervision or cessation. Risk of wandering should be assessed by caregiver and if detected at any point, supervision and safe  proof recommendations should be instituted.  MEDICATION SUPERVISION: Inability to self-administer medication needs to be constantly addressed. Implement a mechanism to ensure safe administration of the medications.  RECOMMENDATIONS FOR ALL PATIENTS WITH MEMORY PROBLEMS: 1. Continue to exercise (Recommend 30 minutes of walking everyday, or 3 hours every week) 2. Increase social interactions - continue going to [place] and enjoy social gatherings with friends and family 3. Eat healthy, avoid fried foods and eat more fruits and vegetables 4. Maintain adequate blood pressure, blood sugar, and blood cholesterol level. Reducing the risk of stroke and cardiovascular disease also helps promoting better memory. 5. Avoid stressful situations. Live a simple life and avoid aggravations. Organize your time and prepare for the next day in anticipation. 6. Sleep well, avoid any interruptions of sleep and avoid any distractions in the bedroom that may interfere with adequate sleep quality 7. Avoid sugar, avoid sweets as there is a strong link between excessive sugar intake, diabetes, and cognitive impairment The Mediterranean diet has been shown to help patients reduce the risk of progressive memory disorders and reduces cardiovascular risk. This includes eating fish, eat fruits and green leafy vegetables, nuts like almonds and hazelnuts, walnuts, and also use olive oil. Avoid fast foods and fried foods as much as possible. Avoid sweets and sugar as sugar use has been linked to worsening of memory function.  There is always a concern of gradual progression of memory problems. If this is the case, then we may need to adjust level of care according to patient needs. Support, both to the patient and caregiver, should then be put into place.

## 2019-09-05 NOTE — Progress Notes (Signed)
NEUROLOGY FOLLOW UP OFFICE NOTE  [name] [id number] Oct 25, 1945  HISTORY OF PRESENT ILLNESS: I had the pleasure of seeing Hunter Boone in follow-up in the neurology clinic on 09/05/2019.  The patient was last seen 4 months ago for Mild Cognitive Impairment. He is again accompanied by his wife who helps supplement the history today.  He had Neurocognitive testing in October 2020 with results indicating Mild Cognitive Disorder, etiology possibly Alzheimer's disease but early in the disease process. He was started on Donepezil 5mg  daily last November 2020, no side effects. He feels he is doing well. He denies any difficulties with complex tasks, he continues to drive without getting lost, no missed medications or bills. 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 His wife feels he is about the same, he still repeats the same questions and misplaces things. No personality changes, he is even-keel, no paranoia or hallucinations. Sleep is good. He denies any headaches, dizziness, vision changes, focal numbness/tingling/weakness, no falls.   History on Initial Assessment 01/23/2019: This is a pleasant 74 year old right-handed man with a history of hypertension, macular degeneration, prior fall with traumatic pneumothorax, recurrent falls in the past, presenting for evaluation of memory loss. He feels his memory is a "7 or 8 out of 10." He started noticing changes several years ago, forgetting dates more than anything else. He lives with his wife Hunter Boone who is present to provide additional information. She started noticing short-term memory changes a few years ago where he would ask the same questions. He however remained pretty good with keeping track of finances and managing his own medications. She denied any driving concerns, he denies getting lost driving. Concern regarding his cognitive status occurred due to significant delirium during his hospitalization last 12/17/2018. He fell on his  left side while cleaning out a storage space and sustained a left closed femoral neck fracture. There was no loss of consciousness, he was brought by EMS to the ER on the evening of 7/5. Records were reviewed. It appears he received fentanyl that evening. He was scheduled for surgery the next day but on 7/6 he became confused and refused surgery. Concern was raised for possible alcohol use and risk of withdrawals, he had mentioned having a few drinks at night, his wife denied significant alcohol use. He was oriented only to self and was unable to give consent for surgery, combative, removing his IV. He kept saying he was having a haircut and wanted to leave. His wife and son tried to convince him on the phone but he was adamant and wanted to have another opinion.  He became very agitated in the evening and needed a sitter. He had a head CT with no acute changes, mild diffuse atrophy and chronic microvascular disease. Bloodwork unremarkable. On 7/7, his wife came to the hospital and tried to convince him, surgery was done that day, it was noted he was calm with no recollection of events from the day prior. He remained confused the day after, thinking he was in [place], wanting to go out and smoke a cigarette, and was given IV Haldol. He was evaluated by Speech Therapy with a MOCA score of 15/30. He was discharged home the day after surgery with home PT. He was calm once he got home. No further confusion since then. His wife reports that he is "the most calm man ever" and works as a Community education officer. No prior similar confusional episodes with surgeries in the past.  No paranoia or hallucinations. His wife called our office to report that he had a Cognitive and Communications Exam at [facility] (report unavailable for review). Per wife, results showed RAW 78, Oral Expression 89, Orientation 83, Speech Comprehension 92, Reading Comprehension 100, Writing 100, Attention 88, Problem Solving 96, Memory 37. All  our of 100 range.   He has a strong family history of dementia in both parents and his sister. He was knocked out one time as a Company secretary in [place], no neurosurgical procedures. He drinks a couple of ounces a day of bourbon for several years. Mood and sleep are good. He denies any headaches, dizziness, diplopia, dysarthria, dysphagia, neck/back pain, focal numbness/tingling/weakness, bowel/bladder dysfunction. No anosmia, tremors, no further falls. His hip is not bothering him.  Diagnostic Data MRI brain without contrast done 01/2019 did not show any acute changes. There was mild diffuse atrophy and mild chronic microvascular disease.  Neuropsychological testing last October 2020 with results indicating Mild Neurocognitive Disorder (mild cognitive impairment). He had primary impairments across semantic fluency and both encoding and retrieval aspects of memory,as well as processing speed, executive functioning, and consolidation aspects of memory. Concerns was raised regarding Alzheimer's disease, given his amnestic memory profile and low performance across semantic fluency, however he did well with other domains commonly implicated in Alzheimer's disease, suggesting he is early in the disease process.   PAST MEDICAL HISTORY: Past Medical History:  Diagnosis Date  . Colonic polyps 04/10/2009   Qualifier: Diagnosis of  By: Fuller Plan MD Lamont Snowball T  Tubular adenoma 2009 , 2011, & 2014 ( tubular adenomatous fragments) Dr Layne Benton [facility]     . Hypertension   . Internal hemorrhoids   . Macular degeneration   . Tubular adenoma of colon     MEDICATIONS: Current Outpatient Medications on File Prior to Visit  Medication Sig Dispense Refill  . amLODipine (NORVASC) 5 MG tablet TAKE 1 TABLET BY MOUTH  DAILY 90 tablet 3  . donepezil (ARICEPT) 5 MG tablet Take 1/2 tablet daily for 2 weeks, then increase to 1 tablet daily and continue 30 tablet 11  . fluticasone (FLONASE) 50 MCG/ACT nasal spray Place 2 sprays  into both nostrils daily as needed for allergies. 48 g 1  . furosemide (LASIX) 20 MG tablet Take 1 tablet (20 mg total) by mouth daily as needed. 90 tablet 2  . losartan (COZAAR) 100 MG tablet TAKE 1 TABLET BY MOUTH  DAILY 90 tablet 2  . LUTEIN PO Take 1 capsule by mouth daily.     . Multiple Vitamins-Minerals (CENTRUM PO) Take 1 tablet by mouth daily.     . Multiple Vitamins-Minerals (PRESERVISION AREDS 2 PO) Take 1 capsule by mouth every morning.     . polyvinyl alcohol (LIQUIFILM TEARS) 1.4 % ophthalmic solution Place 1 drop into both eyes daily as needed for dry eyes.    . rosuvastatin (CRESTOR) 5 MG tablet Take 1 tablet (5 mg total) by mouth daily. 90 tablet 1  . Saw Palmetto 450 MG CAPS Take 450 mg by mouth daily.     . tamsulosin (FLOMAX) 0.4 MG CAPS capsule Take 1 capsule (0.4 mg total) by mouth daily. 90 capsule 3  . vitamin C (ASCORBIC ACID) 500 MG tablet Take 500 mg by mouth daily.     No current facility-administered medications on file prior to visit.    ALLERGIES: No Known Allergies  FAMILY HISTORY: Family History  Problem Relation Age of Onset  . Heart attack Father  Early 60s  . Diabetes Father   . Dementia Father        Unspecified type  . Diabetes Mother   . Dementia Mother        Unspecified type, said to be early-stages  . Liver cancer Maternal Aunt   . Alzheimer's disease Sister        Symptom onset in mid to late 60s  . Stroke Neg Hx   . COPD Neg Hx   . Colon cancer Neg Hx   . Esophageal cancer Neg Hx   . Rectal cancer Neg Hx   . Stomach cancer Neg Hx   . Asthma Neg Hx     SOCIAL HISTORY: Social History   Socioeconomic History  . Marital status: Married    Spouse name: Not on file  . Number of children: Not on file  . Years of education: 71  . Highest education level: Associate degree: academic program  Occupational History  . Occupation: Company secretary  Tobacco Use  . Smoking status: Current Some Day Smoker    Packs/day: 1.00    Types:  Cigarettes  . Smokeless tobacco: Never Used  Substance and Sexual Activity  . Alcohol use: Yes    Alcohol/week: 4.0 - 7.0 standard drinks    Types: 4 - 7 Shots of liquor per week    Comment: 1 drink several times per week  . Drug use: No  . Sexual activity: Not on file  Other Topics Concern  . Not on file  Social History Narrative   Lives with wife in a split level home      Right handed      Highest Level of edu- some college   Social Determinants of Health   Financial Resource Strain:   . Difficulty of Paying Living Expenses:   Food Insecurity:   . Worried About Charity fundraiser in the Last Year:   . Arboriculturist in the Last Year:   Transportation Needs:   . Film/video editor (Medical):   [name] Lack of Transportation (Non-Medical):   Physical Activity:   . Days of Exercise per Week:   . Minutes of Exercise per Session:   Stress:   . Feeling of Stress :   Social Connections:   . Frequency of Communication with Friends and Family:   . Frequency of Social Gatherings with Friends and Family:   . Attends Religious Services:   . Active Member of Clubs or Organizations:   . Attends Archivist Meetings:   [name] Marital Status:   Intimate Partner Violence:   . Fear of Current or Ex-Partner:   . Emotionally Abused:   [name] Physically Abused:   . Sexually Abused:     REVIEW OF SYSTEMS: Constitutional: No fevers, chills, or sweats, no generalized fatigue, change in appetite Eyes: No visual changes, double vision, eye pain Ear, nose and throat: No hearing loss, ear pain, nasal congestion, sore throat Cardiovascular: No chest pain, palpitations Respiratory:  No shortness of breath at rest or with exertion, wheezes GastrointestinaI: No nausea, vomiting, diarrhea, abdominal pain, fecal incontinence Genitourinary:  No dysuria, urinary retention or frequency Musculoskeletal:  No neck pain, back pain Integumentary: No rash, pruritus, skin lesions Neurological: as  above Psychiatric: No depression, insomnia, anxiety Endocrine: No palpitations, fatigue, diaphoresis, mood swings, change in appetite, change in weight, increased thirst Hematologic/Lymphatic:  No anemia, purpura, petechiae. Allergic/Immunologic: no itchy/runny eyes, nasal congestion, recent allergic reactions, rashes  PHYSICAL EXAM: Vitals:   09/05/19  1005  BP: 138/72  Pulse: 72  SpO2: 97%   General: No acute distress Head:  Normocephalic/atraumatic Skin/Extremities: No rash, no edema Neurological Exam: alert and oriented to person, place, and month/year. Did not know date, says it is Tues (it is Wed). No aphasia or dysarthria. Fund of knowledge is appropriate.  Recent and remote memory are impaired. 0/3 delayed recall. Attention and concentration are normal.    Able to name objects and repeat phrases. Cranial nerves: Pupils equal, round, reactive to light. Extraocular movements intact with no nystagmus. Visual fields full. No facial asymmetry. Motor: Bulk and tone normal, muscle strength 5/5 throughout with no pronator Finger to nose testing intact.  Gait narrow-based and steady, able to tandem walk adequately.  Romberg negative.  IMPRESSION: This is a pleasant 74 yo RH man with a history of hypertension, macular degeneration, prior fall with traumatic pneumothorax, recurrent falls in the past, with memory loss. MRI brain unremarkable. Neuropsychological testing indicated Mild Neurocognitive Disorder (mild cognitive impairment), with concern raised for early Alzheimer's disease. We again discussed diagnosis and prognosis. Increase Donepezil to 10mg  daily. Proceed with repeat Neurocognitive testing in October 2021. Continue control of vascular risk factors, physical exercise, and brain stimulation exercises. Follow-up in 6 months, they know to call for any changes.   Thank you for allowing me to participate in his care.  Please do not hesitate to call for any questions or concerns.   [name], M.D.   CC: Dr. Quay Burow

## 2019-10-05 ENCOUNTER — Telehealth: Payer: Self-pay | Admitting: Internal Medicine

## 2019-10-05 ENCOUNTER — Other Ambulatory Visit: Payer: Self-pay | Admitting: Internal Medicine

## 2019-10-05 DIAGNOSIS — I10 Essential (primary) hypertension: Secondary | ICD-10-CM

## 2019-10-05 NOTE — Progress Notes (Signed)
  Chronic Care Management   Note  10/05/2019 Name: Hunter Boone MRN: [id number] DOB: 02-10-1946  Hunter Boone is a 74 y.o. year old male who is a primary care patient of Burns, Claudina Lick, MD. I reached out to Verdis Frederickson by phone today in response to a referral sent by [name] PCP, Binnie Rail, MD.   [name] was given information about Chronic Care Management services today including:  1. CCM service includes personalized support from designated clinical staff supervised by his physician, including individualized plan of care and coordination with other care providers 2. 24/7 contact phone numbers for assistance for urgent and routine care needs. 3. Service will only be billed when office clinical staff spend 20 minutes or more in a month to coordinate care. 4. Only one practitioner may furnish and bill the service in a calendar month. 5. The patient may stop CCM services at any time (effective at the end of the month) by phone call to the office staff.   Patient did not agree to enrollment in care management services and does not wish to consider at this time.   This note is not being shared with the patient for the following reason: To respect privacy (The patient or proxy has requested that the information not be shared).  Follow up plan:   Raynicia Dukes [employer] Scheduler

## 2019-12-25 ENCOUNTER — Ambulatory Visit: Payer: Medicare Other | Admitting: Internal Medicine

## 2019-12-25 ENCOUNTER — Encounter: Payer: Self-pay | Admitting: Internal Medicine

## 2019-12-25 ENCOUNTER — Other Ambulatory Visit: Payer: Self-pay

## 2019-12-25 ENCOUNTER — Ambulatory Visit (INDEPENDENT_AMBULATORY_CARE_PROVIDER_SITE_OTHER): Payer: Medicare Other | Admitting: Internal Medicine

## 2019-12-25 VITALS — BP 140/80 | HR 60 | Temp 98.2°F | Resp 16 | Ht 72.0 in | Wt 127.0 lb

## 2019-12-25 DIAGNOSIS — K4090 Unilateral inguinal hernia, without obstruction or gangrene, not specified as recurrent: Secondary | ICD-10-CM | POA: Diagnosis not present

## 2019-12-25 NOTE — Progress Notes (Signed)
Subjective:  Patient ID: Hunter Boone, male    DOB: 1945-07-02  Age: 74 y.o. MRN: [id number]  CC: Inguinal Hernia  This visit occurred during the SARS-CoV-2 public health emergency.  Safety protocols were in place, including screening questions prior to the visit, additional usage of staff PPE, and extensive cleaning of exam room while observing appropriate contact time as indicated for disinfecting solutions.   NEW TO ME  HPI Hunter Boone presents for concerns about an uncomfortable bulge in his left groin for about a month.  He denies testicular pain or swelling, dysuria, hematuria, abdominal pain, nausea, vomiting, loss of appetite, diarrhea, or constipation.  Outpatient Medications Prior to Visit  Medication Sig Dispense Refill  . amLODipine (NORVASC) 5 MG tablet TAKE 1 TABLET BY MOUTH  DAILY 90 tablet 3  . donepezil (ARICEPT) 10 MG tablet Take 1 tablet daily 90 tablet 3  . fluticasone (FLONASE) 50 MCG/ACT nasal spray Place 2 sprays into both nostrils daily as needed for allergies. 48 g 1  . losartan (COZAAR) 100 MG tablet TAKE 1 TABLET BY MOUTH  DAILY 90 tablet 3  . LUTEIN PO Take 1 capsule by mouth daily.     . Multiple Vitamins-Minerals (CENTRUM PO) Take 1 tablet by mouth daily.     . Multiple Vitamins-Minerals (PRESERVISION AREDS 2 PO) Take 1 capsule by mouth every morning.     . polyvinyl alcohol (LIQUIFILM TEARS) 1.4 % ophthalmic solution Place 1 drop into both eyes daily as needed for dry eyes.    . rosuvastatin (CRESTOR) 5 MG tablet Take 1 tablet (5 mg total) by mouth daily. 90 tablet 1  . Saw Palmetto 450 MG CAPS Take 450 mg by mouth daily.     . tamsulosin (FLOMAX) 0.4 MG CAPS capsule Take 1 capsule (0.4 mg total) by mouth daily. 90 capsule 3  . vitamin C (ASCORBIC ACID) 500 MG tablet Take 500 mg by mouth daily.     No facility-administered medications prior to visit.    ROS Review of Systems  Constitutional: Negative.  Negative for diaphoresis and fatigue.  HENT:  Negative.   Eyes: Negative.   Respiratory: Negative for cough, chest tightness, shortness of breath and wheezing.   Cardiovascular: Negative for chest pain, palpitations and leg swelling.  Gastrointestinal: Negative for abdominal pain, constipation, diarrhea, nausea and vomiting.  Endocrine: Negative.   Genitourinary: Negative.  Negative for decreased urine volume, difficulty urinating, dysuria, scrotal swelling, testicular pain and urgency.  Musculoskeletal: Negative.  Negative for arthralgias and myalgias.  Skin: Negative for color change, pallor and rash.  Neurological: Negative.   Hematological: Negative for adenopathy. Does not bruise/bleed easily.  Psychiatric/Behavioral: Negative.     Objective:  BP 140/80 (BP Location: Left Arm, Patient Position: Sitting, Cuff Size: Normal)   Pulse 60   Temp 98.2 F (36.8 C) (Oral)   Resp 16   Ht 6' (1.829 m)   Wt 127 lb (57.6 kg)   SpO2 95%   BMI 17.22 kg/m   BP Readings from Last 3 Encounters:  12/25/19 140/80  09/05/19 138/72  08/27/19 138/70    Wt Readings from Last 3 Encounters:  12/25/19 127 lb (57.6 kg)  09/05/19 131 lb 6.4 oz (59.6 kg)  08/27/19 131 lb 6.4 oz (59.6 kg)    Physical Exam Vitals reviewed.  Constitutional:      Appearance: Normal appearance. He is not ill-appearing.  HENT:     Mouth/Throat:     Mouth: Mucous membranes are moist.  Eyes:     Conjunctiva/sclera: Conjunctivae normal.  Cardiovascular:     Rate and Rhythm: Normal rate and regular rhythm.     Heart sounds: No murmur heard.   Pulmonary:     Effort: Pulmonary effort is normal.     Breath sounds: No stridor. No wheezing, rhonchi or rales.  Abdominal:     General: Abdomen is flat. Bowel sounds are normal. There is no distension.     Palpations: Abdomen is soft. There is no hepatomegaly, splenomegaly, mass or pulsatile mass.     Tenderness: There is no abdominal tenderness.     Hernia: A hernia is present. Hernia is present in the left  inguinal area. There is no hernia in the umbilical area, ventral area, right femoral area, left femoral area or right inguinal area.  Genitourinary:    Penis: Uncircumcised. No phimosis, paraphimosis, hypospadias, erythema, tenderness, discharge, swelling or lesions.      Testes: Normal.        Right: Mass, tenderness or swelling not present.        Left: Tenderness or swelling not present.     Epididymis:     Right: Normal. Not inflamed or enlarged.     Left: Normal. Not inflamed or enlarged.     Comments: He has an indirect, left inguinal hernia that is present only with Valsalva and then spontaneously reduces. Musculoskeletal:        General: Normal range of motion.     Cervical back: Neck supple.     Right lower leg: No edema.     Left lower leg: No edema.  Lymphadenopathy:     Cervical: No cervical adenopathy.  Skin:    General: Skin is warm and dry.     Coloration: Skin is not pale.  Neurological:     General: No focal deficit present.     Mental Status: He is alert.     Lab Results  Component Value Date   WBC 8.7 08/27/2019   HGB 14.2 08/27/2019   HCT 42.2 08/27/2019   PLT 264.0 08/27/2019   GLUCOSE 95 08/27/2019   CHOL 174 08/27/2019   TRIG 88.0 08/27/2019   HDL 75.60 08/27/2019   LDLCALC 80 08/27/2019   ALT 14 08/27/2019   AST 19 08/27/2019   NA 136 08/27/2019   K 3.9 08/27/2019   CL 100 08/27/2019   CREATININE 0.99 08/27/2019   BUN 18 08/27/2019   CO2 26 08/27/2019   TSH 2.38 02/09/2019   PSA 9.52 (H) 08/16/2017   INR 1.01 05/24/2016   HGBA1C 5.7 08/27/2019    CT Chest Wo Contrast  Result Date: 05/07/2019 CLINICAL DATA:  Lung nodule follow-up EXAM: CT CHEST WITHOUT CONTRAST TECHNIQUE: Multidetector CT imaging of the chest was performed following the standard protocol without IV contrast. COMPARISON:  CT chest dated May 05, 2018. FINDINGS: Cardiovascular: The heart size is normal. Thoracic aortic calcifications are noted. There is no evidence for a  thoracic aortic aneurysm. There is a trace pericardial effusion. Mediastinum/Nodes: --No mediastinal or hilar lymphadenopathy. --No axillary lymphadenopathy. --No supraclavicular lymphadenopathy. --Normal thyroid gland. --The esophagus is unremarkable Lungs/Pleura: Emphysematous changes are noted bilaterally. Again noted is a spiculated nodule in the right upper lobe measuring approximately 11 x 6 mm (previously measuring approximately 7 x 12 mm. The ground-glass nodule in the anterior left lower lobe is essentially stable from prior study. There is no pneumothorax. No new pulmonary nodule. The trachea is unremarkable. Upper Abdomen: No acute abnormality. Musculoskeletal: No chest  wall abnormality. No acute or significant osseous findings. IMPRESSION: 1. Stable spiculated nodule in the right upper lobe. One year follow-up is recommended. 2. Unchanged ground-glass opacity in the left lower lobe. Aortic Atherosclerosis (ICD10-I70.0) and Emphysema (ICD10-J43.9). Electronically Signed   By: Constance Holster M.D.   On: 05/07/2019 15:23    Assessment & Plan:   Woodard was seen today for inguinal hernia.  Diagnoses and all orders for this visit:  Non-recurrent unilateral inguinal hernia without obstruction or gangrene -     Ambulatory referral to General Surgery   I am having Verdis Frederickson maintain his Multiple Vitamins-Minerals (CENTRUM PO), Multiple Vitamins-Minerals (PRESERVISION AREDS 2 PO), Saw Palmetto, LUTEIN PO, vitamin C, polyvinyl alcohol, tamsulosin, amLODipine, fluticasone, rosuvastatin, donepezil, and losartan.  No orders of the defined types were placed in this encounter.    Follow-up: Return if symptoms worsen or fail to improve.  Scarlette Calico, MD

## 2019-12-25 NOTE — Patient Instructions (Signed)

## 2019-12-27 ENCOUNTER — Encounter: Payer: Self-pay | Admitting: Internal Medicine

## 2019-12-28 ENCOUNTER — Other Ambulatory Visit: Payer: Self-pay | Admitting: Internal Medicine

## 2020-01-21 ENCOUNTER — Ambulatory Visit: Payer: Self-pay | Admitting: Surgery

## 2020-01-21 DIAGNOSIS — K4090 Unilateral inguinal hernia, without obstruction or gangrene, not specified as recurrent: Secondary | ICD-10-CM | POA: Diagnosis not present

## 2020-01-21 DIAGNOSIS — G3184 Mild cognitive impairment, so stated: Secondary | ICD-10-CM | POA: Diagnosis not present

## 2020-01-21 NOTE — H&P (Signed)
Hunter Boone Appointment: 01/21/2020 10:15 AM Location: [facility] Patient #: [id number] DOB: [birth year] Married / Language: Hunter Boone / Race: White Male  History of Present Illness Hunter Hector MD; 01/21/2020 10:50 AM) The patient is a 74 year old male who presents with an inguinal hernia. Note for "Inguinal hernia": ` ` ` Patient sent for surgical consultation at the request of Dr Hunter Boone  Chief Complaint: Left inguinal hernia. ` ` The patient is a active patient followed by Dr. Eilleen Boone. Noticed a bulge in the left groin. Started a couple months ago. Saw his primary care physician. Inguinal hernia diagnosis. Surgical consultation offered. Has some mild cognitive impairment followed by Hunter Boone neurology seems stable on Aricept. He did have a fall with pneumothoraces and hip fracture that was repaired last year. I done hemorrhoid surgery on him about 2 years ago. He comes in today with his wife. 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 Moves his bowels couple times a day. No major problems with urination or defecation. I think his hernia was notable a couple months ago. He's had a couple episodes of sharp pain. His wife reminded him that he had a sharp episode this past weekend. Concerning. He is not on blood thinners. Used to be a heavy smoker. Hypertension cholesterol control. On Flomax for some year prostate urinary issues.  (Review of systems as stated in this history (HPI) or in the review of systems. Otherwise all other 12 point ROS are negative) ` ` `  This patient encounter took 25 minutes today to perform the following: obtain history, perform exam, review outside records, interpret tests & imaging, counsel the patient on their diagnosis; and, document this encounter, including findings & plan in the electronic health record (EHR).   This visit occurred during the SARS-CoV-2 public health emergency. Safety protocols were  in place, including screening questions prior to the visit, additional usage of staff PPE, and extensive cleaning of exam room while observing appropriate contact time as indicated for disinfecting solutions.   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 Tamsulosin HCl (0.4MG  Capsule, Oral) Active. Donepezil HCl (10MG  Tablet, Oral) Active. Medications Reconciled    Vitals (Hunter Boone CMA; 01/21/2020 10:07 AM) 01/21/2020 10:07 AM Weight: 124 lb Height: 69in Body Surface Area: 1.69 m Body Mass Index: 18.31 kg/m  Pulse: 104 (Regular)  BP: 118/60(Sitting, Left Arm, Standard)        Physical Exam Hunter Hector MD; 01/21/2020 10:48 AM)  General Mental Status-Alert. General Appearance-Not in acute distress, Not Sickly. Orientation-Oriented X3. Hydration-Well hydrated. Voice-Normal.  Integumentary Global Assessment Upon inspection and palpation of skin surfaces of the - Axillae: non-tender, no inflammation or ulceration, no drainage. and Distribution of scalp and body hair is normal. General Characteristics Temperature - normal warmth is noted.  Head and Neck Head-normocephalic, atraumatic with no lesions or palpable masses. Face Global Assessment - atraumatic, no absence of expression. Neck Global Assessment - no abnormal  movements, no bruit auscultated on the right, no bruit  auscultated on the left, no decreased range of motion, non-tender. Trachea-midline. Thyroid Gland Characteristics - non-tender.  Eye Eyeball - Left-Extraocular movements intact, No Nystagmus - Left. Eyeball - Right-Extraocular movements intact, No Nystagmus - Right. Cornea - Left-No Hazy - Left. Cornea - Right-No Hazy - Right. Sclera/Conjunctiva - Left-No scleral icterus, No Discharge - Left. Sclera/Conjunctiva - Right-No scleral icterus, No Discharge - Right. Pupil - Left-Direct reaction to light normal. Pupil - Right-Direct reaction to light normal.  ENMT Ears Pinna - Left - no drainage observed, no generalized tenderness observed. Pinna - Right - no drainage observed, no generalized tenderness observed. Nose and Sinuses External Inspection of the Nose - no destructive lesion observed. Inspection of the nares - Left - quiet respiration. Inspection of the nares - Right - quiet respiration. Mouth and Throat Lips - Upper Lip - no fissures observed, no pallor noted. Lower Lip - no fissures observed, no pallor noted. Nasopharynx - no discharge present. Oral Cavity/Oropharynx - Tongue - no dryness observed. Oral Mucosa - no cyanosis observed. Hypopharynx - no evidence of airway distress observed.  Chest and Lung Exam Inspection Movements - Normal and Symmetrical. Accessory muscles - No use of accessory muscles in breathing. Palpation Palpation of the chest reveals - Non-tender. Auscultation Breath sounds - Normal and Clear.  Cardiovascular Auscultation Rhythm - Regular. Murmurs & Other Heart Sounds - Auscultation of the heart reveals - No Murmurs and No Systolic Clicks.  Abdomen Inspection Inspection of the abdomen reveals - No Visible peristalsis and No Abnormal pulsations. Umbilicus - No Bleeding, No Urine drainage. Palpation/Percussion Palpation and Percussion of the abdomen reveal - Soft, Non Tender, No  Rebound tenderness, No Rigidity (guarding) and No Cutaneous hyperesthesia. Note: Abdomen soft. Nontender. Not distended. No umbilical or incisional hernias. No guarding.  Male Genitourinary Sexual Maturity Tanner 5 - Adult hair pattern and Adult penile size and shape. Note: Circumcised male. Obvious left groin bulge reducible consistent with inguinal hernia. Seems medial. Mild impulse in the right femoral canal in this very thin male. No obvious indirect or direct hernia on the right side. Testes otherwise normal.  Peripheral Vascular Upper Extremity Inspection - Left - No Cyanotic nailbeds - Left, Not Ischemic. Inspection - Right - No Cyanotic nailbeds - Right, Not Ischemic.  Neurologic Neurologic evaluation reveals -normal attention span and ability to concentrate, able to name objects and repeat phrases. Appropriate fund of knowledge , normal sensation and normal coordination. Mental Status Affect - not angry, not paranoid. Cranial Nerves-Normal Bilaterally. Gait-Normal.  Neuropsychiatric Mental status exam performed with findings of-able to articulate well with normal speech/language, rate, volume and coherence, thought content normal with ability to perform basic computations and apply abstract reasoning and no evidence of hallucinations, delusions, obsessions or homicidal/suicidal ideation. Note: Some mild memory difficulty long-term.  Musculoskeletal Global Assessment Spine, Ribs and Pelvis - no instability, subluxation or laxity. Right Upper Extremity - no instability, subluxation or laxity.  Lymphatic Head & Neck  General Head & Neck Lymphatics: Bilateral - Description - No Localized lymphadenopathy. Axillary  General Axillary Region: Bilateral - Description - No Localized lymphadenopathy. Femoral & Inguinal  Generalized Femoral & Inguinal Lymphatics: Left - Description - No Localized lymphadenopathy. Right - Description - No Localized  lymphadenopathy.    Assessment & Plan Hunter Hector MD; 01/21/2020 10:48 AM)  LEFT INGUINAL HERNIA (K40.90) Impression: Obvious left femoral hernia with intermittent episodes of sharp pain. Possible right femoral hernia as well.  I think he would benefit from laparoscopic exploration and repair  of hernias found. Should be an outpatient surgery. He tolerated hemorrhoid and hip surgery in the past 2 years without much difficulty, so I do not feel he needs any further clearance. Sent by his primary care physician as well.  Discussed with the patient and his wife. Questions answered. They are just in proceeding.  He does have some mild memory problems but seems relatively stable on Aricept. Defer to neurology and primary care. He is otherwise functioning active and tolerated surgeries the past few years without any major cognitive decline, so I do not think that's a barrier from my standpoint.   PREOP - ING HERNIA - ENCOUNTER FOR PREOPERATIVE EXAMINATION FOR GENERAL SURGICAL PROCEDURE (Z01.818)  Current Plans You are being scheduled for surgery- Our schedulers will call you.  You should hear from our office's scheduling department within 5 working days about the location, date, and time of surgery. We try to make accommodations for patient's preferences in scheduling surgery, but sometimes the OR schedule or the surgeon's schedule prevents [place] from making those accommodations.  If you have not heard from our office [phone]) in 5 working days, call the office and ask for your surgeon's nurse.  If you have other questions about your diagnosis, plan, or surgery, call the office and ask for your surgeon's nurse.  Written instructions provided The anatomy & physiology of the abdominal wall and pelvic floor was discussed. The pathophysiology of hernias in the inguinal and pelvic region was discussed. Natural history risks such as progressive enlargement, pain, incarceration, and  strangulation was discussed. Contributors to complications such as smoking, obesity, diabetes, prior surgery, etc were discussed.  I feel the risks of no intervention will lead to serious problems that outweigh the operative risks; therefore, I recommended surgery to reduce and repair the hernia. I explained laparoscopic techniques with possible need for an open approach. I noted usual use of mesh to patch and/or buttress hernia repair  Risks such as bleeding, infection, abscess, need for further treatment, heart attack, death, and other risks were discussed. I noted a good likelihood this will help address the problem. Goals of post-operative recovery were discussed as well. Possibility that this will not correct all symptoms was explained. I stressed the importance of low-impact activity, aggressive pain control, avoiding constipation, & not pushing through pain to minimize risk of post-operative chronic pain or injury. Possibility of reherniation was discussed. We will work to minimize complications.  An educational handout further explaining the pathology & treatment options was given as well. Questions were answered. The patient expresses understanding & wishes to proceed with surgery.  Pt Education - Pamphlet Given - Laparoscopic Hernia Repair: discussed with patient and provided information. Pt Education - [facility] Pain Control (Genesis Paget) Pt Education - [facility] Hernia Post-Op HCI (Ariyannah Pauling): discussed with patient and provided information. Pt Education - [facility] Mesh education: discussed with patient and provided information.  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 Follow by neurology primary care without any major changes at declined. 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 Still occasionally preaching.  Hunter Hector, MD, FACS, MASCRS Gastrointestinal and Minimally Invasive Surgery  [facility] [address]  [place], [place] [address] [phone] Fax [phone] Main/Paging  CONTACT INFORMATION: Weekday (9AM-5PM) concerns: Call [facility] main office at [phone] Weeknight (5PM-9AM) or Weekend/Holiday concerns: Check [email or web address] for General Surgery [facility] coverage (Please, do not use SecureChat as it is not reliable communication to operating surgeons for immediate  patient care)

## 2020-02-20 ENCOUNTER — Ambulatory Visit: Payer: Medicare Other | Admitting: Internal Medicine

## 2020-02-26 NOTE — Patient Instructions (Addendum)
  Blood work was ordered.     Medications reviewed and updated.  Changes include :   none  Your prescription(s) have been submitted to your pharmacy. Please take as directed and contact our office if you believe you are having problem(s) with the medication(s).   Please followup in 6 months   

## 2020-02-26 NOTE — Progress Notes (Signed)
Subjective:    Patient ID: Hunter Boone, male    DOB: [birth year], 74 y.o.   MRN: [id number]  HPI The patient is here for follow up of their chronic medical problems, including htn, prediabetes, COPD, tobacco abuse, BPH, MCI.   His wife is here with him.  He is taking all of his medications as prescribed.    He is not exercising regularly.   He is still smoking.  He is smoking less and hopes to eventually not be smoking.  He has been experiencing stomach pain that started at least  one month ago.  Pain can be intense at times.  Pain is located in the suprapubic region.  He denies any obvious relation to eating, bowel movements or urination.  There is no obvious pattern.  Eating may make it better, but he is not sure.  The pain does not occur daily, but a few times a week. No pain now.  He has had some nausea at times.  He is unsure if that is related.  He is scheduled to have left inguinal surgery later this month and wonders if that is related to his current pain.  He does follow with urology and has known BPH.  He is taking his medication daily.  He denies any dysuria or difficulty urinating.  Occasionally takes ibuprofen or ASA, but not often.      Medications and allergies reviewed with patient and updated if appropriate.  Patient Active Problem List   Diagnosis Date Noted  . Non-recurrent unilateral inguinal hernia without obstruction or gangrene 12/25/2019  . Aortic atherosclerosis ([place]) 08/26/2019  . Mild neurocognitive disorder 03/20/2019  . Leukocytosis 12/27/2018  . Elevated PSA 08/16/2017  . Bilateral leg edema 07/29/2017  . Nocturnal enuresis 07/29/2017  . Prediabetes 02/01/2017  . COPD (chronic obstructive pulmonary disease) with emphysema ([place]) 06/13/2016  . Lung nodule-repeat CT due 04/2020 05/25/2016  . Pityriasis rosea 08/22/2015  . Nonspecific abnormal electrocardiogram (ECG) (EKG) 10/25/2012  . 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 Diverticulosis of  colon 04/10/2009  . Tobacco use 01/26/2008  . Essential hypertension 01/26/2008    Current Outpatient Medications on File Prior to Visit  Medication Sig Dispense Refill  . amLODipine (NORVASC) 5 MG tablet TAKE 1 TABLET BY MOUTH  DAILY 90 tablet 3  . donepezil (ARICEPT) 10 MG tablet Take 1 tablet daily 90 tablet 3  . fluticasone (FLONASE) 50 MCG/ACT nasal spray USE 2 SPRAYS IN BOTH  NOSTRILS DAILY AS NEEDED  FOR ALLERGIES 48 g 1  . losartan (COZAAR) 100 MG tablet TAKE 1 TABLET BY MOUTH  DAILY 90 tablet 3  . LUTEIN PO Take 1 capsule by mouth daily.     . Multiple Vitamins-Minerals (CENTRUM PO) Take 1 tablet by mouth daily.     . Multiple Vitamins-Minerals (PRESERVISION AREDS 2 PO) Take 1 capsule by mouth every morning.     . polyvinyl alcohol (LIQUIFILM TEARS) 1.4 % ophthalmic solution Place 1 drop into both eyes daily as needed for dry eyes.    . rosuvastatin (CRESTOR) 5 MG tablet Take 1 tablet (5 mg total) by mouth daily. 90 tablet 1  . Saw Palmetto 450 MG CAPS Take 450 mg by mouth daily.     . tamsulosin (FLOMAX) 0.4 MG CAPS capsule Take 1 capsule (0.4 mg total) by mouth daily. 90 capsule 3  . vitamin C (ASCORBIC ACID) 500 MG tablet Take 500 mg by mouth daily.     No current facility-administered medications  on file prior to visit.    Past Medical History:  Diagnosis Date  . Colonic polyps 04/10/2009   Qualifier: Diagnosis of  By: Fuller Plan MD Lamont Snowball T  Tubular adenoma 2009 , 2011, & 2014 ( tubular adenomatous fragments) Dr Layne [facility]     . Hypertension   . Internal hemorrhoids   . Macular degeneration   . Tubular adenoma of colon     Past Surgical History:  Procedure Laterality Date  . adenomatous polyps  2009, 2011,2014    Dr Fuller Plan;   . COLONOSCOPY    . EVALUATION UNDER ANESTHESIA WITH HEMORRHOIDECTOMY N/A 07/01/2017   Procedure: ANORECTAL EXAMINATION UNDER ANESTHESIA, HEMORRHOIDECTOMY, HEMORRHOIDAL LIGATION/PEXY;  Surgeon: Michael Boston, MD;  Location: [facility];   Service: General;  Laterality: N/A;  GENERAL AND LOCAL  . POLYPECTOMY    . 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   Procedure: TOTAL HIP ARTHROPLASTY ANTERIOR APPROACH;  Surgeon: Rod Can, MD;  Location: [facility];  Service: Orthopedics;  Laterality: Left;    Social History   Socioeconomic History  . Marital status: Married    Spouse name: Not on file  . Number of children: Not on file  . Years of education: 80  . Highest education level: Associate degree: academic program  Occupational History  . Occupation: Company secretary  Tobacco Use  . Smoking status: Current Every Day Smoker    Packs/day: 1.00    Types: Cigarettes  . Smokeless tobacco: Never Used  Vaping Use  . Vaping Use: Never used  Substance and Sexual Activity  . Alcohol use: Yes    Alcohol/week: 4.0 - 7.0 standard drinks    Types: 4 - 7 Shots of liquor per week    Comment: 1 drink several times per week  . Drug use: No  . Sexual activity: Not on file  Other Topics Concern  . Not on file  Social History Narrative   Lives with wife in a split level home      Right handed      Highest Level of edu- some college   Social Determinants of Health   Financial Resource Strain:   . Difficulty of Paying Living Expenses: Not on file  Food Insecurity:   . Worried About Charity fundraiser in the Last Year: Not on file  . Ran Out of Food in the Last Year: Not on file  Transportation Needs:   . Lack of Transportation (Medical): Not on file  . Lack of Transportation (Non-Medical): Not on file  Physical Activity:   . Days of Exercise per Week: Not on file  . Minutes of Exercise per Session: Not on file  Stress:   . Feeling of Stress : Not on file  Social Connections:   . Frequency of Communication with Friends and Family: Not on file  . Frequency of Social Gatherings with Friends and Family: Not on file  . Attends Religious Services: Not on file  . Active Member of Clubs or Organizations: Not on file  . Attends Theatre manager Meetings: Not on file  . Marital Status: Not on file    Family History  Problem Relation Age of Onset  . Heart attack Father        Early 31s  . Diabetes Father   . Dementia Father        Unspecified type  . Diabetes Mother   . Dementia Mother        Unspecified type, said to be early-stages  . Liver  cancer Maternal Aunt   . Alzheimer's disease Sister        Symptom onset in mid to late 60s  . Stroke Neg Hx   . COPD Neg Hx   . Colon cancer Neg Hx   . Esophageal cancer Neg Hx   . Rectal cancer Neg Hx   . Stomach cancer Neg Hx   . Asthma Neg Hx     Review of Systems  Constitutional: Negative for chills and fever.  Respiratory: Negative for cough, shortness of breath and wheezing.   Cardiovascular: Negative for chest pain, palpitations and leg swelling.  Gastrointestinal: Positive for abdominal pain (intermittent) and nausea. Negative for blood in stool (no melena), constipation and diarrhea (occasional - once every two weeks, uncontrollable).       Once in a while  Neurological: Negative for light-headedness and headaches.       Objective:   Vitals:   02/27/20 1003  BP: 120/70  Pulse: 69  Temp: 98.4 F (36.9 C)  SpO2: 97%   BP Readings from Last 3 Encounters:  02/27/20 120/70  12/25/19 140/80  09/05/19 138/72   Wt Readings from Last 3 Encounters:  02/27/20 125 lb 12.8 oz (57.1 kg)  12/25/19 127 lb (57.6 kg)  09/05/19 131 lb 6.4 oz (59.6 kg)   Body mass index is 17.06 kg/m.   Physical Exam    Constitutional: Appears well-developed and well-nourished. No distress.  HENT:  Head: Normocephalic and atraumatic.  Neck: Neck supple. No tracheal deviation present. No thyromegaly present.  No cervical lymphadenopathy Cardiovascular: Normal rate, regular rhythm and normal heart sounds.   2/6 systolic murmur heard. No carotid bruit .  1 + pitting b/l edema Pulmonary/Chest: Effort normal and breath sounds normal. No respiratory distress. No has no  wheezes. No rales. Abdomen: Soft, nondistended.  No tenderness with palpation.  Area of discomfort is in the suprapubic region, but he denies pain at this time. Skin: Skin is warm and dry. Not diaphoretic.  Psychiatric: Normal mood and affect. Behavior is normal.      Assessment & Plan:    See Problem List for Assessment and Plan of chronic medical problems.    This visit occurred during the SARS-CoV-2 public health emergency.  Safety protocols were in place, including screening questions prior to the visit, additional usage of staff PPE, and extensive cleaning of exam room while observing appropriate contact time as indicated for disinfecting solutions.

## 2020-02-27 ENCOUNTER — Ambulatory Visit (INDEPENDENT_AMBULATORY_CARE_PROVIDER_SITE_OTHER): Payer: Medicare Other | Admitting: Internal Medicine

## 2020-02-27 ENCOUNTER — Other Ambulatory Visit: Payer: Self-pay

## 2020-02-27 ENCOUNTER — Encounter: Payer: Self-pay | Admitting: Internal Medicine

## 2020-02-27 VITALS — BP 120/70 | HR 69 | Temp 98.4°F | Wt 125.8 lb

## 2020-02-27 DIAGNOSIS — N401 Enlarged prostate with lower urinary tract symptoms: Secondary | ICD-10-CM | POA: Diagnosis not present

## 2020-02-27 DIAGNOSIS — I700 Atherosclerosis of aorta: Secondary | ICD-10-CM | POA: Diagnosis not present

## 2020-02-27 DIAGNOSIS — Z23 Encounter for immunization: Secondary | ICD-10-CM

## 2020-02-27 DIAGNOSIS — N138 Other obstructive and reflux uropathy: Secondary | ICD-10-CM | POA: Diagnosis not present

## 2020-02-27 DIAGNOSIS — R7303 Prediabetes: Secondary | ICD-10-CM | POA: Diagnosis not present

## 2020-02-27 DIAGNOSIS — I10 Essential (primary) hypertension: Secondary | ICD-10-CM

## 2020-02-27 DIAGNOSIS — J439 Emphysema, unspecified: Secondary | ICD-10-CM | POA: Diagnosis not present

## 2020-02-27 DIAGNOSIS — R102 Pelvic and perineal pain: Secondary | ICD-10-CM | POA: Insufficient documentation

## 2020-02-27 NOTE — Assessment & Plan Note (Signed)
Chronic Following with urology - up to date with visits On flomax - denies difficulty urinating Having suprapubic pain - ? Related Check UA

## 2020-02-27 NOTE — Assessment & Plan Note (Signed)
Chronic Still smoking, but cutting down and trying to quit - stressed cessation No SOB, cough, wheeze

## 2020-02-27 NOTE — Assessment & Plan Note (Signed)
Chronic BP well controlled Current regimen effective and well tolerated Continue current medications at current doses cmp  

## 2020-02-27 NOTE — Assessment & Plan Note (Signed)
Acute Started at least one month ago - intermittent ? Related to inguinal hernia No urinary or GI symptoms Check UA Will discuss with surgeon if related to inguinal hernias May need to see urology or do imaging

## 2020-02-27 NOTE — Assessment & Plan Note (Signed)
Chronic On crestor Eating healthy Advised regular exercise Check lipids

## 2020-02-27 NOTE — Assessment & Plan Note (Signed)
Chronic Check a1c Low sugar / carb diet Stressed regular exercise  

## 2020-02-28 LAB — COMPLETE METABOLIC PANEL WITH GFR
AG Ratio: 1.5 (calc) (ref 1.0–2.5)
ALT: 19 U/L (ref 9–46)
AST: 20 U/L (ref 10–35)
Albumin: 4.5 g/dL (ref 3.6–5.1)
Alkaline phosphatase (APISO): 66 U/L (ref 35–144)
BUN: 18 mg/dL (ref 7–25)
CO2: 25 mmol/L (ref 20–32)
Calcium: 10.1 mg/dL (ref 8.6–10.3)
Chloride: 99 mmol/L (ref 98–110)
Creat: 1.06 mg/dL (ref 0.70–1.18)
GFR, Est African American: 80 mL/min/{1.73_m2} (ref >=60)
GFR, Est Non African American: 69 mL/min/{1.73_m2} (ref >=60)
Globulin: 3 g/dL (calc) (ref 1.9–3.7)
Glucose, Bld: 97 mg/dL (ref 65–99)
Potassium: 5.5 mmol/L — ABNORMAL HIGH (ref 3.5–5.3)
Sodium: 133 mmol/L — ABNORMAL LOW (ref 135–146)
Total Bilirubin: 0.9 mg/dL (ref 0.2–1.2)
Total Protein: 7.5 g/dL (ref 6.1–8.1)

## 2020-02-28 LAB — CBC WITH DIFFERENTIAL/PLATELET
Absolute Monocytes: 800 cells/uL (ref 200–950)
Basophils Absolute: 70 cells/uL (ref 0–200)
Basophils Relative: 0.8 %
Eosinophils Absolute: 183 cells/uL (ref 15–500)
Eosinophils Relative: 2.1 %
HCT: 42.3 % (ref 38.5–50.0)
Hemoglobin: 14.3 g/dL (ref 13.2–17.1)
Lymphs Abs: 1749 cells/uL (ref 850–3900)
MCH: 32 pg (ref 27.0–33.0)
MCHC: 33.8 g/dL (ref 32.0–36.0)
MCV: 94.6 fL (ref 80.0–100.0)
MPV: 11.5 fL (ref 7.5–12.5)
Monocytes Relative: 9.2 %
Neutro Abs: 5899 cells/uL (ref 1500–7800)
Neutrophils Relative %: 67.8 %
Platelets: 281 10*3/uL (ref 140–400)
RBC: 4.47 10*6/uL (ref 4.20–5.80)
RDW: 12.2 % (ref 11.0–15.0)
Total Lymphocyte: 20.1 %
WBC: 8.7 10*3/uL (ref 3.8–10.8)

## 2020-02-28 LAB — URINALYSIS, ROUTINE W REFLEX MICROSCOPIC
Bacteria, UA: NONE SEEN /HPF
Bilirubin Urine: NEGATIVE
Crystals: NONE SEEN /HPF
Glucose, UA: NEGATIVE
Ketones, ur: NEGATIVE
Leukocytes,Ua: NEGATIVE
Nitrite: NEGATIVE
Protein, ur: NEGATIVE
RBC / HPF: NONE SEEN /HPF (ref 0–2)
Specific Gravity, Urine: 1.015 (ref 1.001–1.03)
Squamous Epithelial / HPF: NONE SEEN /HPF (ref <=5)
pH: 5.5 (ref 5.0–8.0)

## 2020-02-28 LAB — LIPID PANEL
Cholesterol: 158 mg/dL (ref <200)
HDL: 86 mg/dL (ref >=40)
LDL Cholesterol (Calc): 57 mg/dL (calc)
Non-HDL Cholesterol (Calc): 72 mg/dL (calc) (ref <130)
Total CHOL/HDL Ratio: 1.8 (calc) (ref <5.0)
Triglycerides: 72 mg/dL (ref <150)

## 2020-02-28 LAB — HEMOGLOBIN A1C
Hgb A1c MFr Bld: 5.5 % of total Hgb (ref <5.7)
Mean Plasma Glucose: 111 (calc)
eAG (mmol/L): 6.2 (calc)

## 2020-03-06 ENCOUNTER — Encounter (HOSPITAL_BASED_OUTPATIENT_CLINIC_OR_DEPARTMENT_OTHER): Payer: Self-pay | Admitting: Surgery

## 2020-03-06 ENCOUNTER — Other Ambulatory Visit: Payer: Self-pay

## 2020-03-06 NOTE — Progress Notes (Signed)
Spoke w/ via phone for pre-op interview--- Pt and pt's wife, [name] Lab needs dos---- Istat, EKG             Lab results------ current chest CT in epic COVID test ------ 03-08-2020 @ 1100 Arrive at ------- 1015 NPO after MN NO Solid Food.  Clear liquids from MN until--- 0915 Medications to take morning of surgery ----- Crestor, Norvasc, Flomax, Aricept Diabetic medication ----- n/a Patient Special Instructions ----- do not take losartan morning of surgery Pre-Op special Istructions ----- may need pt wife in pre-op for medication due to mild cognitive disorder Patient verbalized understanding of instructions that were given at this phone interview. Patient denies shortness of breath, chest pain, fever, cough at this phone interview.   Anesthesia Review:  HTN, COPD w/ Emphysema (stills smokes 1ppd, no oxygen),  Pre-diabetes, Mild cognitive disorder.  Pt denies cough, any sob with exertion or flight of stairs.    PCP: Dr Billey Gosling (lov 02-27-2020 epic) Neurologist:  Dr Delice Lesch Cassell Clement 09-05-2019 epic) Cardiologist :  no Chest x-ray : CT chest 05-07-2019 epic EKG : 12-17-2018 epic Echo : no Stress test: no Cardiac Cath :  no Activity level:  See above Sleep Study/ CPAP :  NO Fasting Blood Sugar :      / Checks Blood Sugar -- times a day:  N/a Blood Thinner/ Instructions /Last Dose: NO ASA / Instructions/ Last Dose :  NO

## 2020-03-08 ENCOUNTER — Other Ambulatory Visit (HOSPITAL_COMMUNITY)
Admission: RE | Admit: 2020-03-08 | Discharge: 2020-03-08 | Disposition: A | Discharge status: Home / Self Care | Payer: Medicare Other | Source: Ambulatory Visit | Attending: Surgery | Admitting: Surgery

## 2020-03-08 DIAGNOSIS — Z01812 Encounter for preprocedural laboratory examination: Secondary | ICD-10-CM | POA: Diagnosis not present

## 2020-03-08 DIAGNOSIS — Z20822 Contact with and (suspected) exposure to covid-19: Secondary | ICD-10-CM | POA: Diagnosis not present

## 2020-03-08 LAB — SARS CORONAVIRUS 2 (TAT 6-24 HRS): SARS Coronavirus 2: NEGATIVE

## 2020-03-11 ENCOUNTER — Other Ambulatory Visit: Payer: Self-pay | Admitting: Internal Medicine

## 2020-03-12 ENCOUNTER — Ambulatory Visit (HOSPITAL_BASED_OUTPATIENT_CLINIC_OR_DEPARTMENT_OTHER): Payer: Medicare Other | Admitting: Anesthesiology

## 2020-03-12 ENCOUNTER — Other Ambulatory Visit: Payer: Self-pay

## 2020-03-12 ENCOUNTER — Encounter (HOSPITAL_BASED_OUTPATIENT_CLINIC_OR_DEPARTMENT_OTHER): Payer: Self-pay | Admitting: Surgery

## 2020-03-12 ENCOUNTER — Encounter (HOSPITAL_BASED_OUTPATIENT_CLINIC_OR_DEPARTMENT_OTHER)
Admission: RE | Disposition: A | Discharge status: Home / Self Care | Payer: Self-pay | Source: Home / Self Care | Attending: Surgery

## 2020-03-12 ENCOUNTER — Ambulatory Visit (HOSPITAL_BASED_OUTPATIENT_CLINIC_OR_DEPARTMENT_OTHER)
Admission: RE | Admit: 2020-03-12 | Discharge: 2020-03-12 | Disposition: A | Discharge status: Home / Self Care | Payer: Medicare Other | Attending: Surgery | Admitting: Surgery

## 2020-03-12 DIAGNOSIS — D176 Benign lipomatous neoplasm of spermatic cord: Secondary | ICD-10-CM | POA: Insufficient documentation

## 2020-03-12 DIAGNOSIS — N433 Hydrocele, unspecified: Secondary | ICD-10-CM | POA: Diagnosis not present

## 2020-03-12 DIAGNOSIS — Z79899 Other long term (current) drug therapy: Secondary | ICD-10-CM | POA: Insufficient documentation

## 2020-03-12 DIAGNOSIS — K469 Unspecified abdominal hernia without obstruction or gangrene: Secondary | ICD-10-CM | POA: Diagnosis not present

## 2020-03-12 DIAGNOSIS — K4090 Unilateral inguinal hernia, without obstruction or gangrene, not specified as recurrent: Secondary | ICD-10-CM

## 2020-03-12 DIAGNOSIS — K4120 Bilateral femoral hernia, without obstruction or gangrene, not specified as recurrent: Secondary | ICD-10-CM | POA: Diagnosis not present

## 2020-03-12 DIAGNOSIS — G3184 Mild cognitive impairment, so stated: Secondary | ICD-10-CM | POA: Insufficient documentation

## 2020-03-12 DIAGNOSIS — I10 Essential (primary) hypertension: Secondary | ICD-10-CM | POA: Diagnosis not present

## 2020-03-12 DIAGNOSIS — J449 Chronic obstructive pulmonary disease, unspecified: Secondary | ICD-10-CM | POA: Insufficient documentation

## 2020-03-12 DIAGNOSIS — Z87891 Personal history of nicotine dependence: Secondary | ICD-10-CM | POA: Insufficient documentation

## 2020-03-12 DIAGNOSIS — K4020 Bilateral inguinal hernia, without obstruction or gangrene, not specified as recurrent: Secondary | ICD-10-CM | POA: Diagnosis not present

## 2020-03-12 DIAGNOSIS — J440 Chronic obstructive pulmonary disease with acute lower respiratory infection: Secondary | ICD-10-CM | POA: Diagnosis not present

## 2020-03-12 HISTORY — DX: Unspecified macular degeneration: H35.30

## 2020-03-12 HISTORY — DX: Presence of spectacles and contact lenses: Z97.3

## 2020-03-12 HISTORY — PX: INGUINAL HERNIA REPAIR: SHX194

## 2020-03-12 HISTORY — PX: LAPAROSCOPIC ABDOMINAL EXPLORATION: SHX6249

## 2020-03-12 HISTORY — DX: Personal history of colonic polyps: Z86.010

## 2020-03-12 HISTORY — DX: Complete loss of teeth, unspecified cause, unspecified class: K08.109

## 2020-03-12 HISTORY — DX: Atherosclerosis of aorta: I70.0

## 2020-03-12 HISTORY — DX: Solitary pulmonary nodule: R91.1

## 2020-03-12 HISTORY — DX: Benign prostatic hyperplasia without lower urinary tract symptoms: N40.0

## 2020-03-12 HISTORY — DX: Prediabetes: R73.03

## 2020-03-12 HISTORY — DX: Cardiac murmur, unspecified: R01.1

## 2020-03-12 HISTORY — DX: Emphysema, unspecified: J43.9

## 2020-03-12 HISTORY — DX: Personal history of adenomatous and serrated colon polyps: Z86.0101

## 2020-03-12 HISTORY — DX: Personal history of other specified conditions: Z87.898

## 2020-03-12 HISTORY — DX: Personal history of traumatic brain injury: Z87.820

## 2020-03-12 HISTORY — DX: Unspecified hemorrhoids: K64.9

## 2020-03-12 HISTORY — DX: Mild cognitive impairment of uncertain or unknown etiology: G31.84

## 2020-03-12 HISTORY — DX: Unilateral inguinal hernia, without obstruction or gangrene, not specified as recurrent: K40.90

## 2020-03-12 HISTORY — DX: Diverticulosis of large intestine without perforation or abscess without bleeding: K57.30

## 2020-03-12 HISTORY — DX: Personal history of other diseases of the digestive system: Z87.19

## 2020-03-12 LAB — POCT I-STAT, CHEM 8
BUN: 16 mg/dL (ref 8–23)
Calcium, Ion: 1.26 mmol/L (ref 1.15–1.40)
Chloride: 99 mmol/L (ref 98–111)
Creatinine, Ser: 1 mg/dL (ref 0.61–1.24)
Glucose, Bld: 102 mg/dL — ABNORMAL HIGH (ref 70–99)
HCT: 43 % (ref 39.0–52.0)
Hemoglobin: 14.6 g/dL (ref 13.0–17.0)
Potassium: 4.9 mmol/L (ref 3.5–5.1)
Sodium: 134 mmol/L — ABNORMAL LOW (ref 135–145)
TCO2: 26 mmol/L (ref 22–32)

## 2020-03-12 SURGERY — REPAIR, HERNIA, INGUINAL, LAPAROSCOPIC
Anesthesia: General | Site: Abdomen

## 2020-03-12 MED ORDER — TRAMADOL HCL 50 MG PO TABS: 50.0000 mg | ORAL_TABLET | Freq: Four times a day (QID) | ORAL | 0 refills | Status: DC | PRN

## 2020-03-12 MED ORDER — BUPIVACAINE LIPOSOME 1.3 % IJ SUSP
INTRAMUSCULAR | Status: DC | PRN
  Administered 2020-03-12: 20 mL

## 2020-03-12 MED ORDER — OXYCODONE HCL 5 MG PO TABS: 5.0000 mg | ORAL_TABLET | Freq: Once | ORAL | Status: DC | PRN

## 2020-03-12 MED ORDER — BUPIVACAINE LIPOSOME 1.3 % IJ SUSP: 20.0000 mL | Freq: Once | INTRAMUSCULAR | Status: DC

## 2020-03-12 MED ORDER — GLYCOPYRROLATE 0.2 MG/ML IJ SOLN
INTRAMUSCULAR | Status: DC | PRN
  Administered 2020-03-12: .2 mg via INTRAVENOUS

## 2020-03-12 MED ORDER — LACTATED RINGERS IV SOLN: INTRAVENOUS | Status: DC

## 2020-03-12 MED ORDER — FENTANYL CITRATE (PF) 100 MCG/2ML IJ SOLN: 25.0000 ug | INTRAMUSCULAR | Status: DC | PRN

## 2020-03-12 MED ORDER — DEXAMETHASONE SODIUM PHOSPHATE 10 MG/ML IJ SOLN
INTRAMUSCULAR | Status: AC
  Filled 2020-03-12: qty 1

## 2020-03-12 MED ORDER — GABAPENTIN 300 MG PO CAPS
ORAL_CAPSULE | ORAL | Status: AC
  Filled 2020-03-12: qty 1

## 2020-03-12 MED ORDER — 0.9 % SODIUM CHLORIDE (POUR BTL) OPTIME
TOPICAL | Status: DC | PRN
  Administered 2020-03-12 (×2): 1000 mL

## 2020-03-12 MED ORDER — LIDOCAINE HCL (CARDIAC) PF 100 MG/5ML IV SOSY
PREFILLED_SYRINGE | INTRAVENOUS | Status: DC | PRN
  Administered 2020-03-12: 80 mg via INTRAVENOUS

## 2020-03-12 MED ORDER — ACETAMINOPHEN 500 MG PO TABS
ORAL_TABLET | ORAL | Status: AC
  Filled 2020-03-12: qty 2

## 2020-03-12 MED ORDER — EPHEDRINE SULFATE-NACL 50-0.9 MG/10ML-% IV SOSY
PREFILLED_SYRINGE | INTRAVENOUS | Status: DC | PRN
  Administered 2020-03-12 (×2): 10 mg via INTRAVENOUS

## 2020-03-12 MED ORDER — SODIUM CHLORIDE 0.9 % IR SOLN
Status: DC | PRN
  Administered 2020-03-12: 1000 mL

## 2020-03-12 MED ORDER — ENSURE PRE-SURGERY PO LIQD: 296.0000 mL | Freq: Once | ORAL | Status: DC

## 2020-03-12 MED ORDER — SUCCINYLCHOLINE CHLORIDE 20 MG/ML IJ SOLN
INTRAMUSCULAR | Status: DC | PRN
  Administered 2020-03-12: 100 mg via INTRAVENOUS

## 2020-03-12 MED ORDER — BUPIVACAINE-EPINEPHRINE 0.25% -1:200000 IJ SOLN
INTRAMUSCULAR | Status: DC | PRN
  Administered 2020-03-12: 40 mL

## 2020-03-12 MED ORDER — PROPOFOL 10 MG/ML IV BOLUS
INTRAVENOUS | Status: DC | PRN
  Administered 2020-03-12: 100 mg via INTRAVENOUS
  Administered 2020-03-12: 20 mg via INTRAVENOUS

## 2020-03-12 MED ORDER — FENTANYL CITRATE (PF) 100 MCG/2ML IJ SOLN
INTRAMUSCULAR | Status: DC | PRN
Start: 2020-03-12 — End: 2020-03-12
  Administered 2020-03-12 (×2): 50 ug via INTRAVENOUS
  Administered 2020-03-12: 25 ug via INTRAVENOUS

## 2020-03-12 MED ORDER — BUPIVACAINE-EPINEPHRINE 0.5% -1:200000 IJ SOLN
INTRAMUSCULAR | Status: AC
  Filled 2020-03-12: qty 1

## 2020-03-12 MED ORDER — SUGAMMADEX SODIUM 200 MG/2ML IV SOLN
INTRAVENOUS | Status: DC | PRN
  Administered 2020-03-12: 200 mg via INTRAVENOUS

## 2020-03-12 MED ORDER — LIDOCAINE 2% (20 MG/ML) 5 ML SYRINGE
INTRAMUSCULAR | Status: AC
  Filled 2020-03-12: qty 5

## 2020-03-12 MED ORDER — ROCURONIUM BROMIDE 10 MG/ML (PF) SYRINGE
PREFILLED_SYRINGE | INTRAVENOUS | Status: AC
  Filled 2020-03-12: qty 10

## 2020-03-12 MED ORDER — CHLORHEXIDINE GLUCONATE CLOTH 2 % EX PADS: 6.0000 | MEDICATED_PAD | Freq: Once | CUTANEOUS | Status: DC

## 2020-03-12 MED ORDER — GABAPENTIN 300 MG PO CAPS
300.0000 mg | ORAL_CAPSULE | ORAL | Status: AC
  Administered 2020-03-12: 300 mg via ORAL

## 2020-03-12 MED ORDER — PROPOFOL 10 MG/ML IV BOLUS
INTRAVENOUS | Status: AC
  Filled 2020-03-12: qty 20

## 2020-03-12 MED ORDER — EPHEDRINE SULFATE 50 MG/ML IJ SOLN
INTRAMUSCULAR | Status: DC | PRN
  Administered 2020-03-12: 10 mg via INTRAVENOUS

## 2020-03-12 MED ORDER — CEFAZOLIN SODIUM-DEXTROSE 2-4 GM/100ML-% IV SOLN
INTRAVENOUS | Status: AC
  Filled 2020-03-12: qty 100

## 2020-03-12 MED ORDER — ONDANSETRON HCL 4 MG/2ML IJ SOLN
INTRAMUSCULAR | Status: AC
  Filled 2020-03-12: qty 2

## 2020-03-12 MED ORDER — CEFAZOLIN SODIUM-DEXTROSE 2-4 GM/100ML-% IV SOLN
2.0000 g | INTRAVENOUS | Status: AC
  Administered 2020-03-12: 2 g via INTRAVENOUS

## 2020-03-12 MED ORDER — ROCURONIUM BROMIDE 100 MG/10ML IV SOLN
INTRAVENOUS | Status: DC | PRN
  Administered 2020-03-12 (×4): 20 mg via INTRAVENOUS

## 2020-03-12 MED ORDER — FENTANYL CITRATE (PF) 250 MCG/5ML IJ SOLN
INTRAMUSCULAR | Status: AC
  Filled 2020-03-12: qty 5

## 2020-03-12 MED ORDER — ONDANSETRON HCL 4 MG/2ML IJ SOLN: 4.0000 mg | Freq: Once | INTRAMUSCULAR | Status: DC | PRN

## 2020-03-12 MED ORDER — ACETAMINOPHEN 500 MG PO TABS
1000.0000 mg | ORAL_TABLET | ORAL | Status: AC
  Administered 2020-03-12: 1000 mg via ORAL

## 2020-03-12 MED ORDER — EPHEDRINE 5 MG/ML INJ
INTRAVENOUS | Status: AC
  Filled 2020-03-12: qty 10

## 2020-03-12 MED ORDER — BUPIVACAINE LIPOSOME 1.3 % IJ SUSP
INTRAMUSCULAR | Status: AC
  Filled 2020-03-12: qty 20

## 2020-03-12 MED ORDER — MIDAZOLAM HCL 2 MG/2ML IJ SOLN
INTRAMUSCULAR | Status: AC
  Filled 2020-03-12: qty 2

## 2020-03-12 MED ORDER — OXYCODONE HCL 5 MG/5ML PO SOLN: 5.0000 mg | Freq: Once | ORAL | Status: DC | PRN

## 2020-03-12 SURGICAL SUPPLY — 40 items
APL PRP STRL LF DISP 70% ISPRP (MISCELLANEOUS) ×2
CABLE HIGH FREQUENCY MONO STRZ (ELECTRODE) ×3 IMPLANT
CANISTER SUCT 3000ML PPV (MISCELLANEOUS) IMPLANT
CHLORAPREP W/TINT 26 (MISCELLANEOUS) ×3 IMPLANT
COVER WAND RF STERILE (DRAPES) ×3 IMPLANT
DECANTER SPIKE VIAL GLASS SM (MISCELLANEOUS) ×3 IMPLANT
DEVICE SECURE STRAP 25 ABSORB (INSTRUMENTS) IMPLANT
DRAPE WARM FLUID 44X44 (DRAPES) ×3 IMPLANT
DRSG TEGADERM 2-3/8X2-3/4 SM (GAUZE/BANDAGES/DRESSINGS) ×6 IMPLANT
DRSG TEGADERM 4X4.75 (GAUZE/BANDAGES/DRESSINGS) ×3 IMPLANT
ELECT REM PT RETURN 9FT ADLT (ELECTROSURGICAL) ×3
ELECTRODE REM PT RTRN 9FT ADLT (ELECTROSURGICAL) ×2 IMPLANT
GAUZE SPONGE 4X4 12PLY STRL LF (GAUZE/BANDAGES/DRESSINGS) ×1 IMPLANT
GLOVE ECLIPSE 8.0 STRL XLNG CF (GLOVE) ×3 IMPLANT
GLOVE INDICATOR 8.0 STRL GRN (GLOVE) ×3 IMPLANT
GOWN STRL REUS W/TWL XL LVL3 (GOWN DISPOSABLE) ×3 IMPLANT
IRRIG SUCT STRYKERFLOW 2 WTIP (MISCELLANEOUS)
IRRIGATION SUCT STRKRFLW 2 WTP (MISCELLANEOUS) IMPLANT
KIT TURNOVER CYSTO (KITS) ×3 IMPLANT
MANIFOLD NEPTUNE II (INSTRUMENTS) IMPLANT
MESH ULTRAPRO 6X6 15CM15CM (Mesh General) ×2 IMPLANT
NEEDLE HYPO 22GX1.5 SAFETY (NEEDLE) ×3 IMPLANT
NS IRRIG 500ML POUR BTL (IV SOLUTION) ×3 IMPLANT
PACK BASIN DAY SURGERY FS (CUSTOM PROCEDURE TRAY) ×3 IMPLANT
PAD POSITIONING PINK XL (MISCELLANEOUS) ×3 IMPLANT
SCISSORS LAP 5X35 DISP (ENDOMECHANICALS) ×3 IMPLANT
SET TUBE SMOKE EVAC HIGH FLOW (TUBING) ×3 IMPLANT
SLEEVE ADV FIXATION 5X100MM (TROCAR) ×3 IMPLANT
SPONGE GAUZE 2X2 8PLY STRL LF (GAUZE/BANDAGES/DRESSINGS) ×9 IMPLANT
SUT MNCRL AB 4-0 PS2 18 (SUTURE) ×3 IMPLANT
SUT PDS AB 1 CT1 27 (SUTURE) IMPLANT
SUT VIC AB 2-0 SH 27 (SUTURE) ×3
SUT VIC AB 2-0 SH 27XBRD (SUTURE) IMPLANT
SUT VICRYL 0 UR6 27IN ABS (SUTURE) IMPLANT
TOWEL OR 17X26 10 PK STRL BLUE (TOWEL DISPOSABLE) ×3 IMPLANT
TRAY DSU PREP LF (CUSTOM PROCEDURE TRAY) ×3 IMPLANT
TRAY LAPAROSCOPIC (CUSTOM PROCEDURE TRAY) ×3 IMPLANT
TROCAR ADV FIXATION 5X100MM (TROCAR) ×3 IMPLANT
TROCAR XCEL BLUNT TIP 100MML (ENDOMECHANICALS) ×3 IMPLANT
WATER STERILE IRR 500ML POUR (IV SOLUTION) ×3 IMPLANT

## 2020-03-12 NOTE — Anesthesia Postprocedure Evaluation (Signed)
Anesthesia Post Note  Patient: Hunter Boone  Procedure(s) Performed: LAPARSCOPIC LEFT INGUINAL HERNIA REPAIR; BILATERAL FEMORAL HERNIA REPAIR; BILATERAL TAP BLOCK (N/A Abdomen) LAPAROSCOPIC  EXPLORATION AND REPAIRS OF HERNIAS FOUND (N/A )     Patient location during evaluation: Phase II Anesthesia Type: General Level of consciousness: awake and alert, patient cooperative and oriented Pain management: pain level controlled Vital Signs Assessment: post-procedure vital signs reviewed and stable Respiratory status: spontaneous breathing, nonlabored ventilation and respiratory function stable Cardiovascular status: stable and blood pressure returned to baseline Postop Assessment: no apparent nausea or vomiting and adequate PO intake Anesthetic complications: no   No complications documented.  Last Vitals:  Vitals:   03/12/20 1430 03/12/20 1445  BP: (!) 113/96 133/80  Pulse: 72 67  Resp: 17 15  Temp:    SpO2: 100% 97%    Last Pain:  Vitals:   03/12/20 1445  TempSrc:   PainSc: 0-No pain                 Taiyana Kissler,E. Keviana Guida

## 2020-03-12 NOTE — H&P (Signed)
Hunter Boone DOB: [birth year] Married / Language: [name] / Race: White Male  Patient Care Team: Binnie Rail, MD as PCP - General (Internal Medicine) Michael Boston, MD as Consulting Physician (General Surgery) Pyrtle, Lajuan Lines, MD as Consulting Physician (Gastroenterology) Cameron Sprang, MD as Consulting Physician (Neurology)  ` ` Patient sent for surgical consultation at the request of Dr Scarlette Calico  Chief Complaint: Left inguinal hernia. ` ` The patient is a active patient followed by Dr. Eilleen Kempf. Noticed a bulge in the left groin. Started a couple months ago. Saw his primary care physician. Inguinal hernia diagnosis. Surgical consultation offered. Has some mild cognitive impairment followed by [facility] neurology seems stable on Aricept. He did have a fall with pneumothoraces and hip fracture that was repaired last year. I done hemorrhoid surgery on him about 2 years ago. He comes in today with his wife. 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 Moves his bowels couple times a day. No major problems with urination or defecation. I think his hernia was notable a couple months ago. He's had a couple episodes of sharp pain. His wife reminded him that he had a sharp episode this past weekend. Concerning. He is not on blood thinners. Used to be a heavy smoker. Hypertension cholesterol control. On Flomax for some year prostate urinary issues.  (Review of systems as stated in this history (HPI) or in the review of systems. Otherwise all other 12 point ROS are negative) ` ` `  This patient encounter took 25 minutes today to perform the following: obtain history, perform exam, review outside records, interpret tests & imaging, counsel the patient on their diagnosis; and, document this encounter, including findings & plan in the electronic health record (EHR).   This visit occurred during the SARS-CoV-2 public health emergency. Safety protocols  were in place, including screening questions prior to the visit, additional usage of staff PPE, and extensive cleaning of exam room while observing appropriate contact time as indicated for disinfecting solutions.   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 Tamsulosin HCl (0.4MG  Capsule, Oral) Active. Donepezil HCl (10MG  Tablet, Oral) Active. Medications Reconciled    Vitals ([name] CMA; 01/21/2020 10:07 AM) 01/21/2020 10:07 AM Weight: 124 lb Height: 69in Body Surface Area: 1.69 m Body Mass Index: 18.31 kg/m  Pulse: 104 (Regular)  BP: 118/60(Sitting, Left Arm, Standard)        Physical Exam [name] MD; 01/21/2020 10:48 AM)  General Mental Status-Alert. General Appearance-Not in acute distress, Not Sickly. Orientation-Oriented X3. Hydration-Well hydrated. Voice-Normal.  Integumentary Global Assessment Upon inspection and palpation of skin surfaces of the - Axillae: non-tender, no inflammation or ulceration, no drainage. and Distribution of scalp and body hair is normal. General Characteristics Temperature - normal warmth is noted.  Head and Neck Head-normocephalic, atraumatic with no lesions or palpable masses. Face Global Assessment - atraumatic, no absence of expression. Neck Global  Assessment - no abnormal movements, no bruit auscultated on the right, no bruit auscultated on the  left, no decreased range of motion, non-tender. Trachea-midline. Thyroid Gland Characteristics - non-tender.  Eye Eyeball - Left-Extraocular movements intact, No Nystagmus - Left. Eyeball - Right-Extraocular movements intact, No Nystagmus - Right. Cornea - Left-No Hazy - Left. Cornea - Right-No Hazy - Right. Sclera/Conjunctiva - Left-No scleral icterus, No Discharge - Left. Sclera/Conjunctiva - Right-No scleral icterus, No Discharge - Right. Pupil - Left-Direct reaction to light normal. Pupil - Right-Direct reaction to light normal.  ENMT Ears Pinna - Left - no drainage observed, no generalized tenderness observed. Pinna - Right - no drainage observed, no generalized tenderness observed. Nose and Sinuses External Inspection of the Nose - no destructive lesion observed. Inspection of the nares - Left - quiet respiration. Inspection of the nares - Right - quiet respiration. Mouth and Throat Lips - Upper Lip - no fissures observed, no pallor noted. Lower Lip - no fissures observed, no pallor noted. Nasopharynx - no discharge present. Oral Cavity/Oropharynx - Tongue - no dryness observed. Oral Mucosa - no cyanosis observed. Hypopharynx - no evidence of airway distress observed.  Chest and Lung Exam Inspection Movements - Normal and Symmetrical. Accessory muscles - No use of accessory muscles in breathing. Palpation Palpation of the chest reveals - Non-tender. Auscultation Breath sounds - Normal and Clear.  Cardiovascular Auscultation Rhythm - Regular. Murmurs & Other Heart Sounds - Auscultation of the heart reveals - No Murmurs and No Systolic Clicks.  Abdomen Inspection Inspection of the abdomen reveals - No Visible peristalsis and No Abnormal pulsations. Umbilicus - No Bleeding, No Urine drainage. Palpation/Percussion Palpation and Percussion of the abdomen  reveal - Soft, Non Tender, No Rebound tenderness, No Rigidity (guarding) and No Cutaneous hyperesthesia. Note: Abdomen soft. Nontender. Not distended. No umbilical or incisional hernias. No guarding.  Male Genitourinary Sexual Maturity Tanner 5 - Adult hair pattern and Adult penile size and shape. Note: Circumcised male. Obvious left groin bulge reducible consistent with inguinal hernia. Seems medial. Mild impulse in the right femoral canal in this very thin male. No obvious indirect or direct hernia on the right side. Testes otherwise normal.  Peripheral Vascular Upper Extremity Inspection - Left - No Cyanotic nailbeds - Left, Not Ischemic. Inspection - Right - No Cyanotic nailbeds - Right, Not Ischemic.  Neurologic Neurologic evaluation reveals -normal attention span and ability to concentrate, able to name objects and repeat phrases. Appropriate fund of knowledge , normal sensation and normal coordination. Mental Status Affect - not angry, not paranoid. Cranial Nerves-Normal Bilaterally. Gait-Normal.  Neuropsychiatric Mental status exam performed with findings of-able to articulate well with normal speech/language, rate, volume and coherence, thought content normal with ability to perform basic computations and apply abstract reasoning and no evidence of hallucinations, delusions, obsessions or homicidal/suicidal ideation. Note: Some mild memory difficulty long-term.  Musculoskeletal Global Assessment Spine, Ribs and Pelvis - no instability, subluxation or laxity. Right Upper Extremity - no instability, subluxation or laxity.  Lymphatic Head & Neck  General Head & Neck Lymphatics: Bilateral - Description - No Localized lymphadenopathy. Axillary  General Axillary Region: Bilateral - Description - No Localized lymphadenopathy. Femoral & Inguinal  Generalized Femoral & Inguinal Lymphatics: Left - Description - No Localized lymphadenopathy. Right -  Description - No Localized lymphadenopathy.    Assessment & Plan   LEFT INGUINAL HERNIA (K40.90) Impression: Obvious left femoral hernia with intermittent episodes of sharp pain. Possible right femoral hernia as well.  I think he would benefit from laparoscopic exploration and repair of hernias found. Should be an outpatient surgery. He  tolerated hemorrhoid and hip surgery in the past 2 years without much difficulty, so I do not feel he needs any further clearance. Sent by his primary care physician as well.  Discussed with the patient and his wife. Questions answered. They are just in proceeding.  He does have some mild memory problems but seems relatively stable on Aricept. Defer to neurology and primary care. He is otherwise functioning active and tolerated surgeries the past few years without any major cognitive decline, so I do not think that's a barrier from my standpoint.   The anatomy & physiology of the abdominal wall and pelvic floor was discussed. The pathophysiology of hernias in the inguinal and pelvic region was discussed. Natural history risks such as progressive enlargement, pain, incarceration, and strangulation was discussed. Contributors to complications such as smoking, obesity, diabetes, prior surgery, etc were discussed.  I feel the risks of no intervention will lead to serious problems that outweigh the operative risks; therefore, I recommended surgery to reduce and repair the hernia. I explained laparoscopic techniques with possible need for an open approach. I noted usual use of mesh to patch and/or buttress hernia repair  Risks such as bleeding, infection, abscess, need for further treatment, heart attack, death, and other risks were discussed. I noted a good likelihood this will help address the problem. Goals of post-operative recovery were discussed as well. Possibility that this will not correct all symptoms was explained. I stressed the  importance of low-impact activity, aggressive pain control, avoiding constipation, & not pushing through pain to minimize risk of post-operative chronic pain or injury. Possibility of reherniation was discussed. We will work to minimize complications.  An educational handout further explaining the pathology & treatment options was given as well. Questions were answered. The patient expresses understanding & wishes to proceed with surgery.  Pt Education - Pamphlet Given - Laparoscopic Hernia Repair: discussed with patient and provided information. Pt Education - [facility] Pain Control (Talayia Hjort) Pt Education - [facility] Hernia Post-Op HCI (Dashiel Bergquist): discussed with patient and provided information. Pt Education - [facility] Mesh education: discussed with patient and provided information.  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 Follow by neurology primary care without any major changes at declined. 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 Still occasionally preaching.  [name], MD, FACS, MASCRS Gastrointestinal and Minimally Invasive Surgery  [facility] [address], [place], [place] [address] [phone] Fax [phone] Main/Paging  CONTACT INFORMATION: Weekday (9AM-5PM) concerns: Call [facility] main office at [phone] Weeknight (5PM-9AM) or Weekend/Holiday concerns: Check [email or web address] for General Surgery [facility] coverage (Please, do not use SecureChat as it is not reliable communication to operating surgeons for immediate patient care)

## 2020-03-12 NOTE — Anesthesia Procedure Notes (Signed)
Performed by: Flynn-Cook, Henri Guedes A, CRNA       

## 2020-03-12 NOTE — Op Note (Signed)
03/12/2020  1:45 PM  PATIENT:  Hunter Boone  74 y.o. male  Patient Care Team: Binnie Rail, MD as PCP - General (Internal Medicine) Michael Boston, MD as Consulting Physician (General Surgery) Pyrtle, Lajuan Lines, MD as Consulting Physician (Gastroenterology) Cameron Sprang, MD as Consulting Physician (Neurology)  PRE-OPERATIVE DIAGNOSIS:  LEFT AND POSSIBLE RIGHT INGUINAL HERNIAS  POST-OPERATIVE DIAGNOSIS:   LEFT INGUINAL HERNIA BILATERAL FEMORAL HERNIAS  PROCEDURE:   LAPARSCOPIC LEFT INGUINAL HERNIA REPAIR BILATERAL FEMORAL HERNIA REPAIR BILATERAL TAP BLOCK  SURGEON:  [name], MD  ASSISTANT: Daiva Huge, MD, PGY-7, [facility]  I was personally present during the key and critical portions of this procedure and immediately available throughout the entire procedure, as documented in my operative note.   ANESTHESIA:     Regional ilioinguinal and genitofemoral and spermatic cord nerve blocks  General  Nerve block provided with liposomal bupivacaine (Experel) mixed with 0.25% bupivacaine as a Bilateral TAP block x 61mL each side at the level of the transverse abdominis & preperitoneal spaces along the flank at the anterior axillary line, from subcostal ridge to iliac crest under laparoscopic guidance    EBL:  Total I/O In: 800 [I.V.:800] Out: 20 [Blood:20].  See anesthesia record  Delay start of Pharmacological VTE agent (>24hrs) due to surgical blood loss or risk of bleeding:  no  DRAINS: NONE  SPECIMEN:  LEFT INGUINAL HERNIA SAC  DISPOSITION OF SPECIMEN:  N/A  COUNTS:  YES  PLAN OF CARE: Discharge to home after PACU  PATIENT DISPOSITION:  PACU - hemodynamically stable.  INDICATION: Large gentleman with obvious left inguinal hernia.  Some bulging on right low side c/w possible femoral hernia.  I recommended laparoscopic exploration and repair of hernias found  The anatomy & physiology of the abdominal wall and pelvic floor was discussed.  The  pathophysiology of hernias in the inguinal and pelvic region was discussed.  Natural history risks such as progressive enlargement, pain, incarceration & strangulation was discussed.   Contributors to complications such as smoking, obesity, diabetes, prior surgery, etc were discussed.    I feel the risks of no intervention will lead to serious problems that outweigh the operative risks; therefore, I recommended surgery to reduce and repair the hernia.  I explained laparoscopic techniques with possible need for an open approach.  I noted usual use of mesh to patch and/or buttress hernia repair  Risks such as bleeding, infection, abscess, need for further treatment, heart attack, death, and other risks were discussed.  I noted a good likelihood this will help address the problem.   Goals of post-operative recovery were discussed as well.  Possibility that this will not correct all symptoms was explained.  I stressed the importance of low-impact activity, aggressive pain control, avoiding constipation, & not pushing through pain to minimize risk of post-operative chronic pain or injury. Possibility of reherniation was discussed.  We will work to minimize complications.     An educational handout further explaining the pathology & treatment options was given as well.  Questions were answered.  The patient expresses understanding & wishes to proceed with surgery.  OR FINDINGS: Moderate left indirect inguinal hernia with probable noncommunicating hydrocele.  Hernia sac excised.  Small but definite femoral hernia.  No direct space inguinal nor obturator hernias.  On the right side, moderate size femoral hernia.  No obturator, direct space, nor indirect inguinal hernia.  DESCRIPTION:  The patient was identified & brought into the operating room. The patient was  positioned supine with arms tucked. SCDs were active during the entire case. The patient underwent general anesthesia without any difficulty.  The  abdomen was prepped and draped in a sterile fashion. The patient's bladder was emptied.  A Surgical Timeout confirmed our plan.  We made a transverse incision through the inferior umbilical fold.  We made a small transverse nick through the anterior rectus fascia contralateral to the inguinal hernia side and placed a 0-vicryl stitch through the fascia.  I placed a Hasson trocar into the preperitoneal plane.  Entry was clean.  We induced carbon dioxide insufflation. Camera inspection revealed no injury.  We used a 9mm angled scope to bluntly free the peritoneum off the infraumbilical anterior abdominal wall.  We created enough of a preperitoneal pocket to place 9mm ports into the right & left mid-abdomen into this preperitoneal cavity.  We focused attention on the LEFT pelvis since that was the dominant hernia side.   We used blunt & focused sharp dissection to free the peritoneum off the flank and down to the pubic rim.  I freed the anteriolateral bladder wall off the anteriolateral pelvic wall, sparing midline attachments.   I located a swath of peritoneum going into a hernia fascial defect at the  internal ring consistent with  an indirect inguinal hernia..  I gradually freed the peritoneal hernia sac off safely and reduced it into the preperitoneal space.  It looks like he had a moderate size noncommunicating hydrocele associated with the sac.  I trimmed some of the hernia sac off and sent that for pathology.  Closed the peritoneal hernia sac defect with 2-0 Vicryl using laparoscopic intracorporeal suturing.  I freed the peritoneum off the spermatic vessels & vas deferens.  I freed peritoneum off the retroperitoneum along the psoas muscle.  Patient had a small but definite femoral hernia.  Spermatic cord lipoma was dissected away & removed.  I checked & assured hemostasis.     I turned attention on the opposite  RIGHT pelvis.  We did dissection in a similar, mirror-image fashion. The patient had a femoral  hernia.[name]   Spermatic cord lipoma was dissected away & removed.    I checked & assured hemostasis.     I chose 15x15 cm sheets of ultra-lightweight polypropylene mesh (Ultrapro), one for each side.  I cut a single sigmoid-shaped slit ~6cm from a corner of each mesh.  We placed the meshes into the preperitoneal space & laid them as overlapping diamonds such that at the inferior points, a 6x6 cm corner flap rested in the true anterolateral pelvis, covering the obturator & femoral foramina.   I allowed the bladder to return to the pubis, this helping tuck the corners of the mesh in the anteriolateral pelvis.  The medial corners overlapped each other across midline cephalad to the pubic rim.   This provided >2 inch coverage around the hernias. Because the defects well covered and not particularly large, I did not place any tacks.   I held the hernia sacs cephalad & evacuated carbon dioxide.  I closed the fascia with absorbable suture.  I closed the skin using 4-0 monocryl stitch.  Sterile dressings were applied.   The patient was extubated & arrived in the PACU in stable condition..  I had discussed postoperative care with the patient in the holding area.  Instructions are written in the chart.  I discussed operative findings, updated the patient's status, discussed probable steps to recovery, and gave postoperative recommendations to the patient's spouse.  Recommendations were made.  Questions were answered.  She expressed understanding & appreciation.   [name], M.D., F.A.C.S. Gastrointestinal and Minimally Invasive Surgery [facility] [address], [place], [place] [address] [phone] Main / Paging  03/12/2020 1:45 PM

## 2020-03-12 NOTE — Anesthesia Preprocedure Evaluation (Addendum)
Anesthesia Evaluation  Patient identified by MRN, date of birth, ID band Patient awake    Reviewed: Allergy & Precautions, NPO status , Patient's Chart, lab work & pertinent test results  Airway Mallampati: II  TM Distance: >3 FB Neck ROM: Full    Dental  (+) Missing, Dental Advisory Given, Poor Dentition   Pulmonary COPD, Current Smoker,    Pulmonary exam normal breath sounds clear to auscultation       Cardiovascular hypertension, Pt. on medications Normal cardiovascular exam Rhythm:Regular Rate:Normal     Neuro/Psych Mild neurocognitive disorder  Neuromuscular disease    GI/Hepatic negative GI ROS, Neg liver ROS,   Endo/Other  negative endocrine ROS  Renal/GU negative Renal ROS     Musculoskeletal negative musculoskeletal ROS (+)   Abdominal   Peds  Hematology HLD   Anesthesia Other Findings LEFT AND POSSIBLE RIGHT INGUINAL HERNIAS  Reproductive/Obstetrics                           Anesthesia Physical Anesthesia Plan  ASA: III  Anesthesia Plan: General   Post-op Pain Management:    Induction: Intravenous  PONV Risk Score and Plan: 3 and Ondansetron, Dexamethasone and Treatment may vary due to age or medical condition  Airway Management Planned: Oral ETT  Additional Equipment: None  Intra-op Plan:   Post-operative Plan: Extubation in OR  Informed Consent: I have reviewed the patients History and Physical, chart, labs and discussed the procedure including the risks, benefits and alternatives for the proposed anesthesia with the patient or authorized representative who has indicated his/her understanding and acceptance.     Dental advisory given  Plan Discussed with: CRNA  Anesthesia Plan Comments:        Anesthesia Quick Evaluation

## 2020-03-12 NOTE — Anesthesia Procedure Notes (Signed)
Procedure Name: Intubation Date/Time: 03/12/2020 11:47 AM Performed by: Garrel Ridgel, CRNA Pre-anesthesia Checklist: Patient identified, Emergency Drugs available, Suction available and Patient being monitored Patient Re-evaluated:Patient Re-evaluated prior to induction Oxygen Delivery Method: Circle system utilized Preoxygenation: Pre-oxygenation with 100% oxygen Induction Type: IV induction Ventilation: Mask ventilation without difficulty Laryngoscope Size: Mac and 4 Tube type: Oral Tube size: 7.5 mm Number of attempts: 1 Airway Equipment and Method: Stylet and Oral airway Placement Confirmation: ETT inserted through vocal cords under direct vision,  positive ETCO2 and breath sounds checked- equal and bilateral Secured at: 22 cm Tube secured with: Tape Dental Injury: Teeth and Oropharynx as per pre-operative assessment

## 2020-03-12 NOTE — Transfer of Care (Signed)
Immediate Anesthesia Transfer of Care Note  Patient: Hunter Boone  Procedure(s) Performed: LAPARSCOPIC LEFT INGUINAL HERNIA REPAIR; BILATERAL FEMORAL HERNIA REPAIR; BILATERAL TAP BLOCK (N/A Abdomen) LAPAROSCOPIC  EXPLORATION AND REPAIRS OF HERNIAS FOUND (N/A )  Patient Location: PACU  Anesthesia Type:General  Level of Consciousness: oriented, drowsy and patient cooperative  Airway & Oxygen Therapy: Patient Spontanous Breathing and Patient connected to face mask oxygen  Post-op Assessment: Report given to RN and Post -op Vital signs reviewed and stable  Post vital signs: Reviewed and stable  Last Vitals:  Vitals Value Taken Time  BP 110/68 03/12/20 1347  Temp    Pulse 88 03/12/20 1349  Resp 22 03/12/20 1349  SpO2 100 % 03/12/20 1349  Vitals shown include unvalidated device data.  Last Pain:  Vitals:   03/12/20 0947  TempSrc: Oral  PainSc: 0-No pain         Complications: No complications documented.

## 2020-03-12 NOTE — Discharge Instructions (Signed)
[facility]ERNIA REPAIR: POST OP INSTRUCTIONS  ######################################################################  EAT Gradually transition to a high fiber diet with a fiber supplement over the next few weeks after discharge.  Start with a pureed / full liquid diet (see below)  WALK Walk an hour a day.  Control your pain to do that.    CONTROL PAIN Control pain so that you can walk, sleep, tolerate sneezing/coughing, and go up/down stairs.  HAVE A BOWEL MOVEMENT DAILY Keep your bowels regular to avoid problems.  OK to try a laxative to override constipation.  OK to use an antidairrheal to slow down diarrhea.  Call if not better after 2 tries  CALL IF YOU HAVE PROBLEMS/CONCERNS Call if you are still struggling despite following these instructions. Call if you have concerns not answered by these instructions  ######################################################################    1. DIET: Follow a light bland diet & liquids the first 24 hours after arrival home, such as soup, liquids, starches, etc.  Be sure to drink plenty of fluids.  Quickly advance to a usual solid diet within a few days.  Avoid fast food or heavy meals as your are more likely to get nauseated or have irregular bowels.  A low-fat, high-fiber diet for the rest of your life is ideal.   2. Take your usually prescribed home medications unless otherwise directed.  3. PAIN CONTROL: a. Pain is best controlled by a usual combination of three different methods TOGETHER: i. Ice/Heat ii. Over the counter pain medication iii. Prescription pain medication b. Most patients will experience some swelling and bruising around the hernia(s) such as the bellybutton, groins, or old incisions.  Ice packs or heating pads (30-60 minutes up to 6 times a day) will help. Use ice for the first few days to help decrease swelling and bruising, then switch to heat to help relax tight/sore spots and speed recovery.  Some people prefer to use ice  alone, heat alone, alternating between ice & heat.  Experiment to what works for you.  Swelling and bruising can take several weeks to resolve.   c. It is helpful to take an over-the-counter pain medication regularly for the first few weeks.  Choose one of the following that works best for you: i. Naproxen (Aleve, etc)  Two 244m tabs twice a day ii. Ibuprofen (Advil, etc) Three 2036mtabs four times a day (every meal & bedtime) iii. Acetaminophen (Tylenol, etc) 325-65016mour times a day (every meal & bedtime) d. A  prescription for pain medication should be given to you upon discharge.  Take your pain medication as prescribed.  i. If you are having problems/concerns with the prescription medicine (does not control pain, nausea, vomiting, rash, itching, etc), please call us [phone] see if we need to switch you to a different pain medicine that will work better for you and/or control your side effect better. ii. If you need a refill on your pain medication, please contact your pharmacy.  They will contact our office to request authorization. Prescriptions will not be filled after 5 pm or on week-ends.  4. Avoid getting constipated.  Between the surgery and the pain medications, it is common to experience some constipation.  Increasing fluid intake and taking a fiber supplement (such as Metamucil, Citrucel, FiberCon, MiraLax, etc) 1-2 times a day regularly will usually help prevent this problem from occurring.  A mild laxative (prune juice, Milk of Magnesia, MiraLax, etc) should be taken according to package directions if there are no bowel movements after 48  hours.    5. Wash / shower every day.  You may shower over the dressings as they are waterproof.    6. Remove your waterproof bandages, skin tapes, and other bandages 5 days after surgery. You may replace a dressing/Band-Aid to cover the incision for comfort if you wish. You may leave the incisions open to air.  You may replace a  dressing/Band-Aid to cover an incision for comfort if you wish.  Continue to shower over incision(s) after the dressing is off.  7. ACTIVITIES as tolerated:   a. You may resume regular (light) daily activities beginning the next day--such as daily self-care, walking, climbing stairs--gradually increasing activities as tolerated.  Control your pain so that you can walk an hour a day.  If you can walk 30 minutes without difficulty, it is safe to try more intense activity such as jogging, treadmill, bicycling, low-impact aerobics, swimming, etc. b. Save the most intensive and strenuous activity for last such as sit-ups, heavy lifting, contact sports, etc  Refrain from any heavy lifting or straining until you are off narcotics for pain control.   c. DO NOT PUSH THROUGH PAIN.  Let pain be your guide: If it hurts to do something, don't do it.  Pain is your body warning you to avoid that activity for another week until the pain goes down. d. You may drive when you are no longer taking prescription pain medication, you can comfortably wear a seatbelt, and you can safely maneuver your car and apply brakes. e. Dennis Bast may have sexual intercourse when it is comfortable.   8. FOLLOW UP in our office a. Please call [facility] at [phone] to set up an appointment to see your surgeon in the office for a follow-up appointment approximately 2-3 weeks after your surgery. b. Make sure that you call for this appointment the day you arrive home to insure a convenient appointment time.  9.  If you have disability of FMLA / Family leave forms, please bring the forms to the office for processing.  (do not give to your surgeon).  WHEN TO CALL us [phone]: 1. Poor pain control 2. Reactions / problems with new medications (rash/itching, nausea, etc)  3. Fever over 101.5 F (38.5 C) 4. Inability to urinate 5. Nausea and/or vomiting 6. Worsening swelling or bruising 7. Continued bleeding from incision. 8. Increased pain,  redness, or drainage from the incision   The clinic staff is available to answer your questions during regular business hours (8:30am-5pm).  Please don't hesitate to call and ask to speak to one of our nurses for clinical concerns.   If you have a medical emergency, go to the nearest emergency room or call 911.  A surgeon from [facility] is always on call at the hospitals in [facility], [place], [place], [place], [place]  [address] ?  [address] [address], [place], [place]   [address] MAIN: [phone] ? TOLL FREE: [phone] ? FAX: [phone] [email or web address]    Inguinal Hernia, Adult An inguinal hernia develops when fat or the intestines push through a weak spot in a muscle where your leg meets your lower abdomen (groin). This creates a bulge. This kind of hernia could also be: In your scrotum, if you are male. In folds of skin around your vagina, if you are male. There are three types of inguinal hernias: Hernias that can be pushed back into the abdomen (are reducible). This type rarely causes pain.  Hernias that are not reducible (are incarcerated). Hernias that are not reducible and lose their blood supply (are strangulated). This type of hernia requires emergency surgery. What are the causes? This condition is caused by having a weak spot in the muscles or tissues in the groin. This weak spot develops over time. The hernia may poke through the weak spot when you suddenly strain your lower abdominal muscles, such as when you: Lift a heavy object. Strain to have a bowel movement. Constipation can lead to straining. Cough. What increases the risk? This condition is more likely to develop in: Men. Pregnant women. People who: Are overweight. Work in jobs that require long periods of standing or heavy lifting. Have had an inguinal hernia before. Smoke or have lung disease. These factors can lead to long-lasting  (chronic) coughing. What are the signs or symptoms? Symptoms may depend on the size of the hernia. Often, a small inguinal hernia has no symptoms. Symptoms of a larger hernia may include: A lump in the groin area. This is easier to see when standing. It might not be visible when lying down. Pain or burning in the groin. This may get worse when lifting, straining, or coughing. A dull ache or a feeling of pressure in the groin. In men, an unusual lump in the scrotum. Symptoms of a strangulated inguinal hernia may include: A bulge in your groin that is very painful and tender to the touch. A bulge that turns red or purple. Fever, nausea, and vomiting. Inability to have a bowel movement or to pass gas. How is this diagnosed? This condition is diagnosed based on your symptoms, your medical history, and a physical exam. Your health care provider may feel your groin area and ask you to cough. How is this treated? Treatment depends on the size of your hernia and whether you have symptoms. If you do not have symptoms, your health care provider may have you watch your hernia carefully and have you come in for follow-up visits. If your hernia is large or if you have symptoms, you may need surgery to repair the hernia. Follow these instructions at home: Lifestyle Avoid lifting heavy objects. Avoid standing for long periods of time. Do not use any products that contain nicotine or tobacco, such as cigarettes and e-cigarettes. If you need help quitting, ask your health care provider. Maintain a healthy weight. Preventing constipation Take actions to prevent constipation. Constipation leads to straining with bowel movements, which can make a hernia worse or cause a hernia repair to break down. Your health care provider may recommend that you: Drink enough fluid to keep your urine pale yellow. Eat foods that are high in fiber, such as fresh fruits and vegetables, whole grains, and beans. Limit foods that  are high in fat and processed sugars, such as fried or sweet foods. Take an over-the-counter or prescription medicine for constipation. General instructions You may try to push the hernia back in place by very gently pressing on it while lying down. Do not try to force the bulge back in if it will not push in easily. Watch your hernia for any changes in shape, size, or color. Get help right away if you notice any changes. Take over-the-counter and prescription medicines only as told by your health care provider. Keep all follow-up visits as told by your health care provider. This is important. Contact a health care provider if: You have a fever. You develop new symptoms. Your symptoms get worse. Get help right away  if: You have pain in your groin that suddenly gets worse. You have a bulge in your groin that: Suddenly gets bigger and does not get smaller. Becomes red or purple or painful to the touch. You are a man and you have a sudden pain in your scrotum, or the size of your scrotum suddenly changes. You cannot push the hernia back in place by very gently pressing on it when you are lying down. Do not try to force the bulge back in if it will not push in easily. You have nausea or vomiting that does not go away. You have a fast heartbeat. You cannot have a bowel movement or pass gas. These symptoms may represent a serious problem that is an emergency. Do not wait to see if the symptoms will go away. Get medical help right away. Call your local emergency services (911 in the [place] Summary An inguinal hernia develops when fat or the intestines push through a weak spot in a muscle where your leg meets your lower abdomen (groin). This condition is caused by having a weak spot in muscles or tissue in your groin. Symptoms may depend on the size of the hernia, and they may include pain or swelling in your groin. A small inguinal hernia often has no symptoms. Treatment may not be needed if you  do not have symptoms. If you have symptoms or a large hernia, you may need surgery to repair the hernia. Avoid lifting heavy objects. Also avoid standing for long amounts of time. This information is not intended to replace advice given to you by your health care provider. Make sure you discuss any questions you have with your health care provider. Document Revised: 07/02/2017 Document Reviewed: 03/02/2017 Elsevier Patient Education  [place] Instructions  Activity: Get plenty of rest for the remainder of the day. A responsible individual must stay with you for 24 hours following the procedure.  For the next 24 hours, DO NOT: -Drive a car -Paediatric nurse -Drink alcoholic beverages -Take any medication unless instructed by your physician -Make any legal decisions or sign important papers.  Meals: Start with liquid foods such as gelatin or soup. Progress to regular foods as tolerated. Avoid greasy, spicy, heavy foods. If nausea and/or vomiting occur, drink only clear liquids until the nausea and/or vomiting subsides. Call your physician if vomiting continues.  Special Instructions/Symptoms: Your throat may feel dry or sore from the anesthesia or the breathing tube placed in your throat during surgery. If this causes discomfort, gargle with warm salt water. The discomfort should disappear within 24 hours.  Information for Discharge Teaching: EXPAREL (bupivacaine liposome injectable suspension)   Your surgeon or anesthesiologist gave you EXPAREL(bupivacaine) to help control your pain after surgery.   EXPAREL is a local anesthetic that provides pain relief by numbing the tissue around the surgical site.  EXPAREL is designed to release pain medication over time and can control pain for up to 72 hours.  Depending on how you respond to EXPAREL, you may require less pain medication during your recovery.  Possible side effects:  Temporary loss of  sensation or ability to move in the area where bupivacaine was injected.  Nausea, vomiting, constipation  Rarely, numbness and tingling in your mouth or lips, lightheadedness, or anxiety may occur.  Call your doctor right away if you think you may be experiencing any of these sensations, or if you have other questions regarding possible side effects.  Follow  all other discharge instructions given to you by your surgeon or nurse. Eat a healthy diet and drink plenty of water or other fluids.  If you return to the hospital for any reason within 96 hours following the administration of EXPAREL, it is important for health care providers to know that you have received this anesthetic. A teal colored band has been placed on your arm with the date, time and amount of EXPAREL you have received in order to alert and inform your health care providers. Please leave this armband in place for the full 96 hours following administration, and then you may remove the band.  May remove green armband Sunday, March 16, 2020.

## 2020-03-13 ENCOUNTER — Encounter (HOSPITAL_BASED_OUTPATIENT_CLINIC_OR_DEPARTMENT_OTHER): Payer: Self-pay | Admitting: Surgery

## 2020-03-13 LAB — SURGICAL PATHOLOGY

## 2020-03-19 ENCOUNTER — Encounter: Payer: Medicare Other | Admitting: Counselor

## 2020-03-19 ENCOUNTER — Encounter: Payer: Medicare Other | Admitting: Psychology

## 2020-03-26 ENCOUNTER — Encounter: Payer: Medicare Other | Admitting: Counselor

## 2020-03-26 ENCOUNTER — Encounter: Payer: Medicare Other | Admitting: Psychology

## 2020-04-12 ENCOUNTER — Ambulatory Visit: Payer: Medicare Other | Attending: Internal Medicine

## 2020-04-12 DIAGNOSIS — Z23 Encounter for immunization: Secondary | ICD-10-CM

## 2020-04-12 NOTE — Progress Notes (Signed)
   Covid-19 Vaccination Clinic  Name:  FLEMING PRILL    MRN: [id number] DOB: [birth year]  04/12/2020  Mr. Puertas was observed post Covid-19 immunization for 15 minutes without incident. He was provided with Vaccine Information Sheet and instruction to access the V-Safe system.   Mr. Berkovich was instructed to call 911 with any severe reactions post vaccine: [name] Difficulty breathing  . Swelling of face and throat  . A fast heartbeat  . A bad rash all over body  . Dizziness and weakness

## 2020-04-14 ENCOUNTER — Encounter: Payer: Medicare Other | Admitting: Psychology

## 2020-04-21 ENCOUNTER — Encounter: Payer: Medicare Other | Admitting: Psychology

## 2020-04-25 ENCOUNTER — Telehealth: Payer: Self-pay | Admitting: Pharmacist

## 2020-04-25 NOTE — Progress Notes (Signed)
Chronic Care Management Pharmacy Assistant   Name: Hunter Boone  MRN: [id number] DOB: [birth year]  Reason for Encounter: Medication Adherence Call    PCP : Binnie Rail, MD  Allergies:  No Known Allergies  Medications: Outpatient Encounter Medications as of 04/25/2020  Medication Sig  . amLODipine (NORVASC) 5 MG tablet TAKE 1 TABLET BY MOUTH  DAILY (Patient taking differently: Take 5 mg by mouth daily. )  . donepezil (ARICEPT) 10 MG tablet Take 1 tablet daily (Patient taking differently: Take 10 mg by mouth daily. Take 1 tablet daily)  . fluticasone (FLONASE) 50 MCG/ACT nasal spray USE 2 SPRAYS IN BOTH  NOSTRILS DAILY AS NEEDED  FOR ALLERGIES (Patient taking differently: Place 2 sprays into both nostrils as needed. )  . losartan (COZAAR) 100 MG tablet TAKE 1 TABLET BY MOUTH  DAILY (Patient taking differently: Take 100 mg by mouth daily. )  . LUTEIN PO Take 1 capsule by mouth daily.   . Multiple Vitamins-Minerals (CENTRUM PO) Take 1 tablet by mouth daily.   . Multiple Vitamins-Minerals (PRESERVISION AREDS 2 PO) Take 1 capsule by mouth every morning.   . polyvinyl alcohol (LIQUIFILM TEARS) 1.4 % ophthalmic solution Place 1 drop into both eyes daily as needed for dry eyes.  . rosuvastatin (CRESTOR) 5 MG tablet TAKE 1 TABLET ONCE DAILY.  . Saw Palmetto 450 MG CAPS Take 450 mg by mouth daily.   . tamsulosin (FLOMAX) 0.4 MG CAPS capsule Take 1 capsule (0.4 mg total) by mouth daily. (Patient taking differently: Take 0.4 mg by mouth daily. )  . traMADol (ULTRAM) 50 MG tablet Take 1-2 tablets (50-100 mg total) by mouth every 6 (six) hours as needed for moderate pain or severe pain.  . vitamin C (ASCORBIC ACID) 500 MG tablet Take 500 mg by mouth daily.   No facility-administered encounter medications on file as of 04/25/2020.    Current Diagnosis: Patient Active Problem List   Diagnosis Date Noted  . Bilateral femoral hernias s/p lap repair w mesh 03/12/2020 03/12/2020  . Suprapubic  pain 02/27/2020  . Left inguinal hernia s/p lap repair w mesh 03/12/2020 12/25/2019  . Aortic atherosclerosis ([place]) 08/26/2019  . Mild neurocognitive disorder 03/20/2019  . Leukocytosis 12/27/2018  . Elevated PSA 08/16/2017  . Bilateral leg edema 07/29/2017  . Nocturnal enuresis 07/29/2017  . Prediabetes 02/01/2017  . 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 Lung nodule-repeat CT due 04/2020 05/25/2016  . Pityriasis rosea 08/22/2015  . Nonspecific abnormal electrocardiogram (ECG) (EKG) 10/25/2012  . 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 Diverticulosis of colon 04/10/2009  . Tobacco use 01/26/2008  . Essential hypertension 01/26/2008    Goals Addressed   None     Follow-Up:  Pharmacist Review   The patient was on the medication adherence list for his rosuvastatin. After speaking with the patient he stated that he has been taking his medication daily as prescribed but the last time he filled the medication was on 12/28/19 and he has 10 pills left. I informed him that if he has been taking the medication daily then he should have ran out of pills by now and he was supposed to have a new fill on 03/27/20 for another 90-day supply. The patient then placed his wife on the phone so she can better understand what is going on.   After speaking with the patients wife she realized that he has not been taking the rosuvastatin daily and she is not  sure how many days he missed but he does have quite a few pills left in the bottle. I explained to her how the medication should be once daily. She understood and stated that she will make sure to start checking his medications everyday to be sure he is taking his medications as prescribed daily.   I also informed her that he is able to pick up his new 90-day supply now from the pharmacy since his refill was due on 03/27/20.  Rosendo Gros, [facility]  Practice Team Manager/ CPA (Clinical Pharmacist  Assistant) [phone]

## 2020-05-01 ENCOUNTER — Telehealth: Payer: Self-pay | Admitting: Neurology

## 2020-05-01 NOTE — Telephone Encounter (Signed)
Patient's son called to update Dr Delice Lesch and Dr Melvyn Novas prior to patient's appointment 05/02/20. Patient's wife is in hospital, and once released, she will be in rehab for a few months. Son is reporting he is unsure if patient can function on his own for that length of time, he has noticed patient is more forgetful and unsure of how to get places ("he's lived in [place] his whole life"). Son [place] out of state and is having to go back home 05/02/20 sometime after patient's appointment. He wasn't sure how his dad would react if these things were brought up in front of the patient and if his dad would be willing to continue with the testing, but wanted to make the Drs aware so it can be handled appropriately. Son is aware we can't share information with him, he is bringing patient to appointment 05/02/20 and will update DPR then.

## 2020-05-02 ENCOUNTER — Ambulatory Visit (INDEPENDENT_AMBULATORY_CARE_PROVIDER_SITE_OTHER): Payer: Medicare Other | Admitting: Psychology

## 2020-05-02 ENCOUNTER — Other Ambulatory Visit: Payer: Self-pay

## 2020-05-02 DIAGNOSIS — G3184 Mild cognitive impairment, so stated: Secondary | ICD-10-CM | POA: Diagnosis not present

## 2020-05-02 NOTE — Progress Notes (Signed)
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    Figure Copy --- 7 16 Below Average    Line Orientation --- 14/20 17-25 Below Average to Average  Language --- 78 7 Well Below Average    Picture Naming --- 10/10 51-75 Average    Semantic Fluency --- 2 <1 Exceptionally Low  Attention --- 106 66 Average    Digit Span --- 14 91 Above Average    Coding --- 8 25 Average  Delayed Memory --- 40 <1 Exceptionally Low    List Recall --- 1/10 3-9 Well Below Average    List Recognition --- 10/20 <2 Exceptionally Low    Story Recall --- 1 <1 Exceptionally Low    Story Recognition --- 5/12 1-2 Exceptionally Low    Figure Recall --- 1 <1 Exceptionally Low    Figure Recognition --- 3/8 4-8 Well Below Average        Intellectual Functioning:             Standard Score Standard Score Percentile   Test of Premorbid Functioning: 95 100 50 Average        Memory:            Wechsler Memory Scale (WMS-IV):                       Raw Score (Scaled Score) Raw Score (Scaled Score) Percentile     Logical Memory I 15/53 (4) --- --- ---    Logical Memory II 0/39 (1) --- --- ---    Logical Memory Recognition 15/23 --- --- ---        [place]  Verbal Learning Test (CVLT-III) Brief Form: Raw Score (Scaled/Standard Score) Raw Score (Scaled/Standard Score) Percentile     Total Trials 1-4 13/36 (57) --- --- ---    Short-Delay Free Recall 2/9 (1) --- --- ---    Long-Delay Free Recall 0/9 (1) --- --- ---    Long-Delay Cued Recall 0/9 (1) --- --- ---    Recognition Hits 3/9 (3) --- --- ---    False Positive Errors 8 (1) --- --- ---  Brief Visuospatial Memory Test (BVMT-R), Form 1: Raw Score (T Score) Raw Score (T Score) Percentile     Total Trials 1-3 6/36 (24) --- --- ---    Delayed Recall 0/12 (20) --- --- ---    Recognition Discrimination Index 5 --- --- ---    Recognition Hits 6/6 --- --- ---    False Positive Errors 1 --- --- ---         Attention/Executive Function:            Trail Making Test (TMT): Raw Score Raw Score (T Score) Percentile     Part A 58 secs.,  0 errors 57 secs.,  0 errors (35) 7 Well Below Average    Part B 176 secs.,  2 errors 148 secs.,  2 errors (39) 14 Below Average          Scaled Score Scaled Score Percentile   WAIS-IV Coding: 6 7 16  Below Average        NAB Attention Module, Form 1: T Score T Score Percentile     Digits Forward 48 40 16 Below Average    Digits Backwards 43 43 25 Average         Scaled Score Scaled Score Percentile   WAIS-IV Similarities: --- 7 16 Below Average        D-KEFS Color-Word Interference Test: Raw Score Raw Score (Scaled Score) Percentile     Color Naming 49 secs. 55 secs. (1) <1 Exceptionally Low    Word Reading 33 secs. 30 secs. (8) 25 Average    Inhibition 100 secs. 128 secs. (1) <1 Exceptionally Low      Total Errors 1 error 5 errors (8) 25 Average    Inhibition/Switching 163 secs. 124 secs. (4) 2 Well Below Average      Total Errors 6 errors 3 errors (10) 50 Average        D-KEFS 20 Questions Test: Scaled Score Scaled Score Percentile     Total Weighted Achievement Score 10 13 84 Above Average    Initial Abstraction Score 9 7 16  Below  Average        NAB Executive Functions Module, Form 1: T Score T Score Percentile     Judgment --- 47 38 Average        Language:            Verbal Fluency Test: Raw Score Raw Score (T Score) Percentile     Phonemic Fluency (FAS) 30 35 (47) 38 Average    Animal Fluency 11 10 (28) 2 Exceptionally Low         NAB Language Module, Form 1: T Score T Score Percentile     Naming 47 27/31 (34) 5 Well Below Average        Visuospatial/Visuoconstruction:       Raw Score Raw Score Percentile   Clock Drawing: 10/10 8/10 --- Within Normal Limits        NAB Spatial Module, Form 1: T Score T Score Percentile     Visual Discrimination --- 31 3 Well Below Average    Figure Drawing Copy 56 62 88 Above Average         Scaled Score Scaled Score Percentile   WAIS-IV Matrix Reasoning: 10 10 50 Average        Mood and Personality:       Raw Score Raw Score Percentile   Geriatric Depression Scale: 8 7 --- Within Normal Limits  Geriatric Anxiety Scale: 12 13 --- Mild  Somatic 4 6 --- Mild    Cognitive 4 3 --- Mild    Affective 4 4 --- Mild        Additional Questionnaires:       Raw Score Raw Score Percentile   PROMIS Sleep Disturbance Questionnaire: 11 13 --- None to Slight   Informed Consent and Coding/Compliance:   Mr. Hannold was provided with a verbal description of the nature and purpose of the present neuropsychological evaluation. Also reviewed were the foreseeable risks and/or discomforts and benefits of the procedure, limits of confidentiality, and mandatory reporting requirements of this provider. The patient was given the opportunity to ask questions and receive answers about the evaluation. Oral consent to participate was provided by the patient.   This evaluation was conducted by Christia Reading, Ph.D., licensed clinical neuropsychologist. Mr. Coppola completed a comprehensive clinical interview with Dr. Melvyn Novas, billed as one unit [phone], and 140 minutes of cognitive testing and scoring,  billed as one unit [phone] and four additional units 96137. As a separate and discrete service, Dr. Melvyn Novas spent a total of 160 minutes in interpretation and report writing billed as one unit [phone] and two units [id number].

## 2020-05-05 ENCOUNTER — Encounter: Payer: Self-pay | Admitting: Psychology

## 2020-05-14 ENCOUNTER — Ambulatory Visit (INDEPENDENT_AMBULATORY_CARE_PROVIDER_SITE_OTHER): Payer: Medicare Other | Admitting: Psychology

## 2020-05-14 DIAGNOSIS — G3184 Mild cognitive impairment, so stated: Secondary | ICD-10-CM

## 2020-05-14 NOTE — Progress Notes (Signed)
   Neuropsychology Feedback Session Tillie Rung. [place] Department of Neurology     Mr. Critzer called to reschedule his feedback appointment to 05/16/2020 at 10:00am. This appointment will remain a telephone/virtual appointment.

## 2020-05-15 IMAGING — RF OPERATIVE LEFT HIP WITH PELVIS
1 series · 2 of 2 positions shown · non-contrast
Comparison: 12/17/2018

CLINICAL DATA: Status post left hip arthroplasty.

EXAM:
OPERATIVE LEFT HIP (WITH PELVIS IF PERFORMED) 2 VIEWS
TECHNIQUE: Fluoroscopic spot image(s) were submitted for interpretation
post-operatively.

[Series 1: run · 2 of 2 slices shown]
[im 1/2]
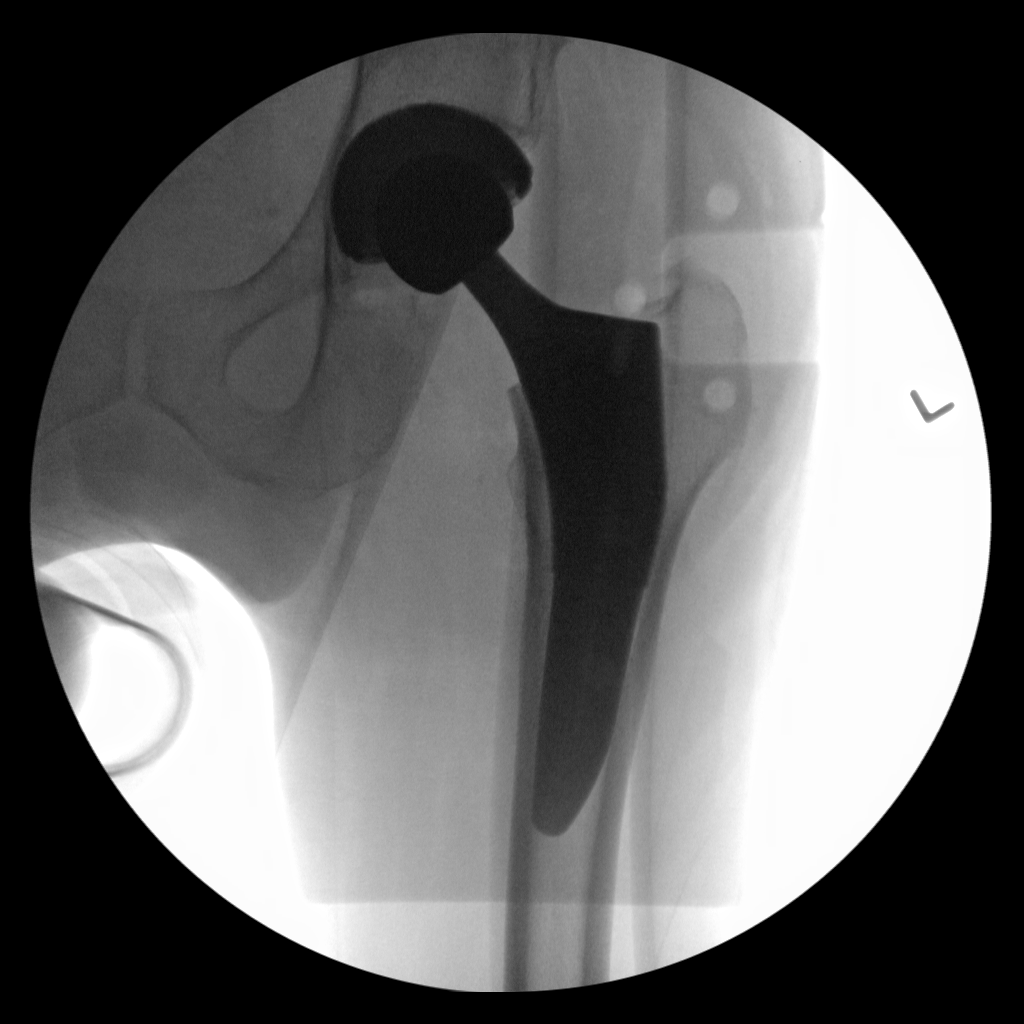
[im 2/2]
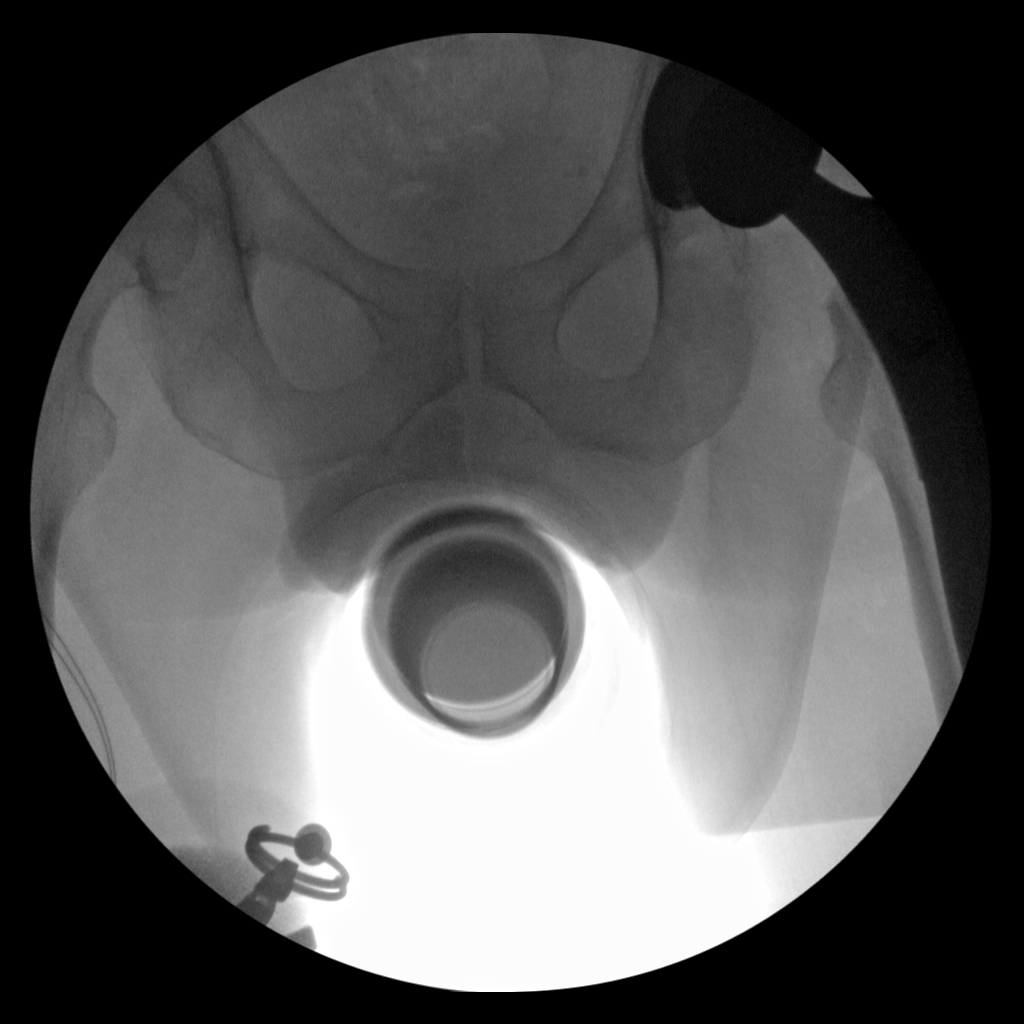

[2 of 2 positions shown; findings below may reference images not displayed]

FINDINGS: New total left hip arthroplasty appears well seated and well aligned
on the submitted images. No acute fracture or evidence of an
operative complication.
IMPRESSION: Well-positioned total left hip arthroplasty.

## 2020-05-16 ENCOUNTER — Ambulatory Visit (INDEPENDENT_AMBULATORY_CARE_PROVIDER_SITE_OTHER): Payer: Medicare Other | Admitting: Psychology

## 2020-05-16 ENCOUNTER — Other Ambulatory Visit: Payer: Self-pay

## 2020-05-16 DIAGNOSIS — G3184 Mild cognitive impairment, so stated: Secondary | ICD-10-CM | POA: Diagnosis not present

## 2020-05-16 NOTE — Patient Instructions (Signed)
A repeat neuropsychological evaluation in 18 months (or sooner if functional decline is noted) is recommended to assess the trajectory of future cognitive decline should it occur. This will also aid in future efforts towards improved diagnostic clarity.  Should there be further progression of his current deficits over time, Mr. Shin is unlikely to regain any independent living skills lost. Therefore, it is recommended that he remain as involved as possible in all aspects of household chores, finances, and medication management, with supervision to ensure adequate performance. He will likely benefit from the establishment and maintenance of a routine in order to maximize his functional abilities over time.  It will be important for Mr. Lennox to have another person with him when in situations where he may need to process information, weigh the pros and cons of different options, and make decisions, in order to ensure that he fully understands and recalls all information to be considered.  If not already done, Mr. Fesler and his family may want to discuss his wishes regarding durable power of attorney and medical decision making, so that he can have input into these choices. Additionally, they may wish to discuss future plans for caretaking and seek out community options for in home/residential care should they become necessary.  Mr. Swor is encouraged to attend to lifestyle factors for brain health (e.g., regular physical exercise, good nutrition habits, regular participation in cognitively-stimulating activities, and general stress management techniques), which are likely to have benefits for both emotional adjustment and cognition. Optimal control of vascular risk factors (including safe cardiovascular exercise and adherence to dietary recommendations) is encouraged. Continued participation in activities which provide mental stimulation and social interaction is also recommended.   Information  important to remember should be provided in written format in all instances. This should be placed in a commonly frequented and highly visible area of his home to help promote recall.   To address problems with processing speed, he may wish to consider:   -Ensuring that he is alerted when essential material or instructions are being presented   -Adjusting the speed at which new information is presented   -Allowing for more time in comprehending, processing, and responding in conversation  To address problems with executive dysfunction, he may wish to consider:   -Avoiding external distractions when needing to concentrate   -Limiting exposure to fast paced environments with multiple sensory demands   -Writing down complicated information and using checklists   -Attempting and completing one task at a time (i.e., no multi-tasking)   -Verbalizing aloud each step of a task to maintain focus   -Reducing the amount of information considered at one time

## 2020-05-16 NOTE — Progress Notes (Signed)
   Neuropsychology Feedback Session Hunter Boone. [facility] Department of Neurology  Reason for Referral:   [name] a 74 y.o. right-handed Caucasian male referred by [name], M.D.,to characterize hiscurrent cognitive functioning and assist with diagnostic clarity and treatment planning in the context of a prior diagnosis of a mild neurocognitive disorder with concerns surrounding the presence of Alzheimer's disease.   Feedback:   [name] completed a comprehensive neuropsychological evaluation on 05/02/2020. Please refer to that encounter for the full report and recommendations. Briefly, results suggested prominent impairments surrounding all aspects of verbal and visual memory, as well as additional deficits in semantic fluency and visual discrimination. Performance variability was exhibited across processing speed, executive functioning, and confrontation naming. Relative to his previous evaluation, amnestic performance across memory measures and poor semantic fluency continued to represent significant impairments. Largely mild performance declines were also seen across confrontation naming, visual discrimination, and consolidation aspects of memory. Performance across other cognitive domains was largely stable over time. Regarding etiology of cognitive impairment and performance decline, I continue to have strong concerns surrounding Alzheimer's disease. Deficits in memory and semantic memory are consistent with this presentation, as are declines in confrontation naming and visual discrimination. I continue to believe that [name] remains in the earlier portions of this disease process.  [name] was accompanied by his wife during the current telephone call. He was within his residence while I was within my office. I discussed the limitations of evaluation and management by telemedicine and the availability of in person appointments. [name] expressed his  understanding and agreed to proceed. Content of the current session focused on the results of his neuropsychological evaluation. [name] and his wife were given the opportunity to ask questions and their questions were answered. They were encouraged to reach out should additional questions arise. A copy of his report was mailed at the conclusion of the visit.      16 minutes were spent conducting the current feedback session with [name], billed as one unit [phone].

## 2020-06-09 ENCOUNTER — Telehealth: Payer: Self-pay | Admitting: Psychology

## 2020-06-09 NOTE — Telephone Encounter (Signed)
Patient son Randall Hiss would like to speak to you about father. He wants to make sure what his mother is saying is correct he has some questions

## 2020-06-09 NOTE — Telephone Encounter (Signed)
I attempted to call Mr. Hunter Boone son Hunter Boone back. However, he did not answer. His voicemail is full and I was unable to leave him a message.

## 2020-07-20 ENCOUNTER — Other Ambulatory Visit: Payer: Self-pay | Admitting: Internal Medicine

## 2020-07-20 DIAGNOSIS — I10 Essential (primary) hypertension: Secondary | ICD-10-CM

## 2020-07-27 ENCOUNTER — Other Ambulatory Visit: Payer: Self-pay | Admitting: Internal Medicine

## 2020-08-20 ENCOUNTER — Telehealth: Payer: Self-pay | Admitting: Internal Medicine

## 2020-08-20 NOTE — Telephone Encounter (Signed)
LVM for pt to rtn my call to schedule AWV with [facility]. Please schedule appt if pt calls the office.  

## 2020-08-26 ENCOUNTER — Ambulatory Visit: Payer: Medicare Other | Admitting: Internal Medicine

## 2020-08-28 NOTE — Patient Instructions (Addendum)
  Blood work was ordered.       Medications changes include :        Please followup in 6 months   

## 2020-08-28 NOTE — Progress Notes (Signed)
Subjective:    Patient ID: Hunter Boone, male    DOB: 11-Dec-1945, 75 y.o.   MRN: [id number]  HPI The patient is here for follow up of their chronic medical problems, including htn, prediabetes, COPD, tobacco abuse, BPH, MCI.  He is here with his wife.  He is eating well.  He walks some.   He has no concerns.   Medications and allergies reviewed with patient and updated if appropriate.  Patient Active Problem List   Diagnosis Date Noted  . Bilateral femoral hernias s/p lap repair w mesh 03/12/2020 03/12/2020  . Suprapubic pain 02/27/2020  . Left inguinal hernia s/p lap repair w mesh 03/12/2020 12/25/2019  . Aortic atherosclerosis ([place]) 08/26/2019  . Mild neurocognitive disorder 03/20/2019  . Leukocytosis 12/27/2018  . Elevated PSA 08/16/2017  . Bilateral leg edema 07/29/2017  . Nocturnal enuresis 07/29/2017  . Prediabetes 02/01/2017  . COPD (chronic obstructive pulmonary disease) with emphysema ([place]) 06/13/2016  . Lung nodule-repeat CT due 04/2020 05/25/2016  . Pityriasis rosea 08/22/2015  . Nonspecific abnormal electrocardiogram (ECG) (EKG) 10/25/2012  . 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 Diverticulosis of colon 04/10/2009  . Tobacco use 01/26/2008  . Essential hypertension 01/26/2008    Current Outpatient Medications on File Prior to Visit  Medication Sig Dispense Refill  . amLODipine (NORVASC) 5 MG tablet TAKE 1 TABLET BY MOUTH  DAILY 90 tablet 3  . donepezil (ARICEPT) 10 MG tablet Take 1 tablet daily (Patient taking differently: Take 10 mg by mouth daily. Take 1 tablet daily) 90 tablet 3  . fluticasone (FLONASE) 50 MCG/ACT nasal spray USE 2 SPRAYS IN BOTH  NOSTRILS DAILY AS NEEDED  FOR ALLERGIES (Patient taking differently: Place 2 sprays into both nostrils as needed.) 48 g 1  . losartan (COZAAR) 100 MG tablet TAKE 1 TABLET BY MOUTH  DAILY 90 tablet 3  . LUTEIN PO Take 1 capsule by mouth daily.    . Multiple Vitamins-Minerals (CENTRUM PO) Take 1 tablet by mouth  daily.    . Multiple Vitamins-Minerals (PRESERVISION AREDS 2 PO) Take 1 capsule by mouth every morning.     . polyvinyl alcohol (LIQUIFILM TEARS) 1.4 % ophthalmic solution Place 1 drop into both eyes daily as needed for dry eyes.    . rosuvastatin (CRESTOR) 5 MG tablet TAKE 1 TABLET ONCE DAILY. 90 tablet 0  . Saw Palmetto 450 MG CAPS Take 450 mg by mouth daily.     . tamsulosin (FLOMAX) 0.4 MG CAPS capsule Take 1 capsule (0.4 mg total) by mouth daily. (Patient taking differently: Take 0.4 mg by mouth daily.) 90 capsule 3  . vitamin C (ASCORBIC ACID) 500 MG tablet Take 500 mg by mouth daily.     No current facility-administered medications on file prior to visit.    Past Medical History:  Diagnosis Date  . Aortic atherosclerosis ([facility])    chronic--- followed by pcp  . BPH without obstruction/lower urinary tract symptoms    urologist--- dr bell  . COPD with emphysema ([place])    followed by pcp---  never used oxygen,  still smokes 1 ppd  . Diverticulosis of colon   . Full dentures   . Heart murmur    per pt's pcp note 02-27-2020 in epic , she heard murmur  . Hemorrhoids   . History of adenomatous polyp of colon   . History of concussion    per pt in [place] ,  no resiudal  . History of elevated PSA  negative bx 2019  . History of gastroesophageal reflux (GERD)    03-06-2020 per pt no issues for several yrs  . History of pneumothorax 2017   traumatic due to fall  . Hypertension    followed by pcp  . Left inguinal hernia   . Macular degeneration, left eye   . Mild neurocognitive disorder    neruologist--- dr Delice Lesch  . Nodule of right lung    lower lobe--- followed by pcp--- last chest CT 05-07-2019 stable  . Pre-diabetes   . Wears glasses     Past Surgical History:  Procedure Laterality Date  . COLONOSCOPY  last one 04-20-2016  . EVALUATION UNDER ANESTHESIA WITH HEMORRHOIDECTOMY N/A 07/01/2017   Procedure: ANORECTAL EXAMINATION UNDER ANESTHESIA, HEMORRHOIDECTOMY,  HEMORRHOIDAL LIGATION/PEXY;  Surgeon: Michael Boston, MD;  Location: [facility];  Service: General;  Laterality: N/A;  GENERAL AND LOCAL  . INGUINAL HERNIA REPAIR N/A 03/12/2020   Procedure: LAPARSCOPIC LEFT INGUINAL HERNIA REPAIR; BILATERAL FEMORAL HERNIA REPAIR; BILATERAL TAP BLOCK;  Surgeon: Michael Boston, MD;  Location: [facility];  Service: General;  Laterality: N/A;  . LAPAROSCOPIC ABDOMINAL EXPLORATION N/A 03/12/2020   Procedure: LAPAROSCOPIC  EXPLORATION AND REPAIRS OF HERNIAS FOUND;  Surgeon: Michael Boston, MD;  Location: [facility];  Service: General;  Laterality: N/A;  . 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   Procedure: TOTAL HIP ARTHROPLASTY ANTERIOR APPROACH;  Surgeon: Rod Can, MD;  Location: [facility];  Service: Orthopedics;  Laterality: Left;    Social History   Socioeconomic History  . Marital status: Married    Spouse name: Not on file  . Number of children: Not on file  . Years of education: 40  . Highest education level: Associate degree: academic program  Occupational History  . Occupation: Company secretary  Tobacco Use  . Smoking status: Current Every Day Smoker    Packs/day: 1.00    Years: 56.00    Pack years: 56.00    Types: Cigarettes, Pipe  . Smokeless tobacco: Never Used  . Tobacco comment: 03-06-2020 per pt quit pipe approx. 02/ 2021,  smoked since age 34  Vaping Use  . Vaping Use: Never used  Substance and Sexual Activity  . Alcohol use: Yes    Alcohol/week: 7.0 standard drinks    Types: 7 Standard drinks or equivalent per week    Comment: 03-06-2020  per pt on drink per day 2-3 oz  . Drug use: Never  . Sexual activity: Not on file  Other Topics Concern  . Not on file  Social History Narrative   Lives with wife in a split level home      Right handed      Highest Level of edu- some college   Social Determinants of Health   Financial Resource Strain: Not on file  Food Insecurity: Not on file  Transportation Needs: Not on  file  Physical Activity: Not on file  Stress: Not on file  Social Connections: Not on file    Family History  Problem Relation Age of Onset  . Heart attack Father        Early 24s  . Diabetes Father   . Dementia Father        Unspecified type  . Diabetes Mother   . Dementia Mother        Unspecified type, said to be early-stages  . Liver cancer Maternal Aunt   . Alzheimer's disease Sister        Symptom onset in mid to late  60s  . Stroke Neg Hx   . COPD Neg Hx   . Colon cancer Neg Hx   . Esophageal cancer Neg Hx   . Rectal cancer Neg Hx   . Stomach cancer Neg Hx   . Asthma Neg Hx     Review of Systems  Constitutional: Negative for appetite change, chills and fever.  Respiratory: Negative for cough, shortness of breath and wheezing.   Cardiovascular: Negative for chest pain, palpitations and leg swelling.  Genitourinary: Negative for difficulty urinating, dysuria and hematuria.  Neurological: Negative for light-headedness.  Psychiatric/Behavioral: Negative for dysphoric mood and sleep disturbance. The patient is not nervous/anxious.        Objective:   Vitals:   08/29/20 1024  BP: 126/70  Pulse: 90  Temp: 98.3 F (36.8 C)  SpO2: 98%   BP Readings from Last 3 Encounters:  08/29/20 126/70  03/12/20 (!) 148/73  02/27/20 120/70   Wt Readings from Last 3 Encounters:  08/29/20 129 lb (58.5 kg)  03/12/20 125 lb 1.6 oz (56.7 kg)  02/27/20 125 lb 12.8 oz (57.1 kg)   Body mass index is 17.5 kg/m.  Depression screen [facility] 2/9 08/29/2020 08/27/2019 05/12/2017 02/02/2017 06/13/2015  Decreased Interest 0 0 0 0 0  Down, Depressed, Hopeless 0 0 0 0 0  PHQ - 2 Score 0 0 0 0 0  Altered sleeping 0 - - - -  Tired, decreased energy 1 - - - -  Change in appetite 0 - - - -  Feeling bad or failure about yourself  0 - - - -  Trouble concentrating 0 - - - -  Moving slowly or fidgety/restless 0 - - - -  Suicidal thoughts 0 - - - -  PHQ-9 Score 1 - - - -  Difficult doing  work/chores Not difficult at all - - - -    GAD 7 : Generalized Anxiety Score 08/29/2020  Nervous, Anxious, on Edge 0  Control/stop worrying 1  Worry too much - different things 0  Trouble relaxing 0  Restless 0  Easily annoyed or irritable 0  Afraid - awful might happen 0  Total GAD 7 Score 1  Anxiety Difficulty Not difficult at all       Physical Exam    Constitutional: Appears well-developed and well-nourished. No distress.  HENT:  Head: Normocephalic and atraumatic.  Neck: Neck supple. No tracheal deviation present. No thyromegaly present.  No cervical lymphadenopathy Cardiovascular: Normal rate, regular rhythm and normal heart sounds.   No murmur heard. No carotid bruit .  No edema Pulmonary/Chest: Effort normal and breath sounds normal. No respiratory distress. No has no wheezes. No rales.  Skin: Skin is warm and dry. Not diaphoretic.  Psychiatric: Normal mood and affect. Behavior is normal.      Assessment & Plan:    Screened for depression using the PHQ 9 scale.  No evidence of depression.   Screening for anxiety using the GAD-7 scale.  There is no evidence of anxiety.   See Problem List for Assessment and Plan of chronic medical problems.    This visit occurred during the SARS-CoV-2 public health emergency.  Safety protocols were in place, including screening questions prior to the visit, additional usage of staff PPE, and extensive cleaning of exam room while observing appropriate contact time as indicated for disinfecting solutions.

## 2020-08-29 ENCOUNTER — Encounter: Payer: Self-pay | Admitting: Internal Medicine

## 2020-08-29 ENCOUNTER — Ambulatory Visit (INDEPENDENT_AMBULATORY_CARE_PROVIDER_SITE_OTHER): Payer: Medicare Other | Admitting: Internal Medicine

## 2020-08-29 ENCOUNTER — Other Ambulatory Visit: Payer: Self-pay

## 2020-08-29 VITALS — BP 126/70 | HR 90 | Temp 98.3°F | Ht 72.0 in | Wt 129.0 lb

## 2020-08-29 DIAGNOSIS — Z720 Tobacco use: Secondary | ICD-10-CM

## 2020-08-29 DIAGNOSIS — N138 Other obstructive and reflux uropathy: Secondary | ICD-10-CM

## 2020-08-29 DIAGNOSIS — G3184 Mild cognitive impairment, so stated: Secondary | ICD-10-CM | POA: Diagnosis not present

## 2020-08-29 DIAGNOSIS — N401 Enlarged prostate with lower urinary tract symptoms: Secondary | ICD-10-CM

## 2020-08-29 DIAGNOSIS — I700 Atherosclerosis of aorta: Secondary | ICD-10-CM

## 2020-08-29 DIAGNOSIS — R7303 Prediabetes: Secondary | ICD-10-CM

## 2020-08-29 DIAGNOSIS — I10 Essential (primary) hypertension: Secondary | ICD-10-CM | POA: Diagnosis not present

## 2020-08-29 DIAGNOSIS — J439 Emphysema, unspecified: Secondary | ICD-10-CM | POA: Diagnosis not present

## 2020-08-29 LAB — LIPID PANEL
Cholesterol: 142 mg/dL (ref 0–200)
HDL: 71.9 mg/dL (ref >39.00)
LDL Cholesterol: 46 mg/dL (ref 0–99)
NonHDL: 70.18
Total CHOL/HDL Ratio: 2
Triglycerides: 119 mg/dL (ref 0.0–149.0)
VLDL: 23.8 mg/dL (ref 0.0–40.0)

## 2020-08-29 LAB — COMPREHENSIVE METABOLIC PANEL
ALT: 15 U/L (ref 0–53)
AST: 17 U/L (ref 0–37)
Albumin: 4.3 g/dL (ref 3.5–5.2)
Alkaline Phosphatase: 69 U/L (ref 39–117)
BUN: 19 mg/dL (ref 6–23)
CO2: 28 mEq/L (ref 19–32)
Calcium: 9.8 mg/dL (ref 8.4–10.5)
Chloride: 100 mEq/L (ref 96–112)
Creatinine, Ser: 1.01 mg/dL (ref 0.40–1.50)
GFR: 72.97 mL/min (ref >60.00)
Glucose, Bld: 92 mg/dL (ref 70–99)
Potassium: 4 mEq/L (ref 3.5–5.1)
Sodium: 137 mEq/L (ref 135–145)
Total Bilirubin: 0.8 mg/dL (ref 0.2–1.2)
Total Protein: 7.9 g/dL (ref 6.0–8.3)

## 2020-08-29 LAB — HEMOGLOBIN A1C: Hgb A1c MFr Bld: 5.9 % (ref 4.6–6.5)

## 2020-08-29 MED ORDER — TAMSULOSIN HCL 0.4 MG PO CAPS
0.4000 mg | ORAL_CAPSULE | Freq: Every day | ORAL | 3 refills | Status: DC
Start: 2020-08-29 — End: 2022-03-08

## 2020-08-29 MED ORDER — ROSUVASTATIN CALCIUM 5 MG PO TABS
5.0000 mg | ORAL_TABLET | Freq: Every day | ORAL | 2 refills | Status: DC
Start: 2020-08-29 — End: 2021-06-04

## 2020-08-29 NOTE — Assessment & Plan Note (Signed)
Chronic Following with neurology and taking Aricept 10 mg nightly Independent of ADLs

## 2020-08-29 NOTE — Assessment & Plan Note (Signed)
Chronic Continues to smoke, but he does have a desire to quit Stressed the importance of quitting-he will try to taper off how much he is smoking and states he does not smoke that much now

## 2020-08-29 NOTE — Assessment & Plan Note (Signed)
Chronic He denies any cough, wheeze or shortness of breath He is still smoking-stressed smoking cessation

## 2020-08-29 NOTE — Assessment & Plan Note (Signed)
Chronic Following with urology BPH symptoms well controlled Continue tamsulosin 0.4 mg daily

## 2020-08-29 NOTE — Assessment & Plan Note (Signed)
Chronic Check a1c Low sugar / carb diet Stressed regular exercise  

## 2020-08-29 NOTE — Assessment & Plan Note (Signed)
Chronic Blood pressure well controlled Continue amlodipine 5 mg daily, furosemide 20 mg daily, losartan 100 mg daily CMP

## 2020-08-29 NOTE — Assessment & Plan Note (Signed)
Chronic Cholesterol has been well controlled Continue rosuvastatin 5 mg daily Check lipid panel, CMP Encourage regular exercise and healthy diet

## 2020-09-05 ENCOUNTER — Telehealth: Payer: Self-pay | Admitting: Internal Medicine

## 2020-09-05 NOTE — Telephone Encounter (Signed)
Results given to patient today. 

## 2020-09-05 NOTE — Telephone Encounter (Signed)
Calling back for lab results from 03.18.22

## 2020-12-23 DIAGNOSIS — R3121 Asymptomatic microscopic hematuria: Secondary | ICD-10-CM | POA: Diagnosis not present

## 2020-12-23 DIAGNOSIS — R3912 Poor urinary stream: Secondary | ICD-10-CM | POA: Diagnosis not present

## 2021-02-17 ENCOUNTER — Telehealth: Payer: Self-pay | Admitting: Internal Medicine

## 2021-02-17 MED ORDER — DONEPEZIL HCL 10 MG PO TABS
ORAL_TABLET | ORAL | 3 refills | Status: DC
Start: 2021-02-17 — End: 2021-09-30

## 2021-02-17 NOTE — Telephone Encounter (Signed)
1.Medication Requested: donepezil (ARICEPT) 10 MG tablet 2. Pharmacy (Name, Street, [place]): [facility], [address]. On Med List: Y  4. Last Visit with PCP: 08/29/20  5. Next visit date with PCP:  03/03/21   Agent: Please be advised that RX refills may take up to 3 business days. We ask that you follow-up with your pharmacy.   Patient's wife is aware that another provider ordered the medication but the pt has not been seen by that provider in a long time. Requesting an order from PCP since he has been seen recently with her.   Please advise.

## 2021-03-03 ENCOUNTER — Ambulatory Visit: Payer: Medicare Other | Admitting: Internal Medicine

## 2021-03-19 ENCOUNTER — Other Ambulatory Visit (HOSPITAL_BASED_OUTPATIENT_CLINIC_OR_DEPARTMENT_OTHER): Payer: Self-pay

## 2021-03-30 NOTE — Progress Notes (Signed)
Subjective:    Patient ID: Hunter Boone, male    DOB: [birth year], 75 y.o.   MRN: [id number]  This visit occurred during the SARS-CoV-2 public health emergency.  Safety protocols were in place, including screening questions prior to the visit, additional usage of staff PPE, and extensive cleaning of exam room while observing appropriate contact time as indicated for disinfecting solutions.     HPI The patient is here for follow up of their chronic medical problems, including htn, prediabetes, COPD, tobacco abuse, BPH, MCI.  He is here with his wife.  He is taking all of his medications as prescribed.     He exercises some.  He feels his memory is pretty good.   Medications and allergies reviewed with patient and updated if appropriate.  Patient Active Problem List   Diagnosis Date Noted   Bilateral femoral hernias s/p lap repair w mesh 03/12/2020 03/12/2020   Left inguinal hernia s/p lap repair w mesh 03/12/2020 12/25/2019   Aortic atherosclerosis ([place]) 08/26/2019   Mild neurocognitive disorder 03/20/2019   Prediabetes 02/01/2017   COPD (chronic obstructive pulmonary disease) with emphysema ([place]) 06/13/2016   Lung nodule-repeat CT due 04/2020 05/25/2016   Pityriasis rosea 08/22/2015   Nonspecific abnormal electrocardiogram (ECG) (EKG) 10/25/2012   BPH with urinary obstruction 07/11/2009   Diverticulosis of colon 04/10/2009   Tobacco use 01/26/2008   Essential hypertension 01/26/2008    Current Outpatient Medications on File Prior to Visit  Medication Sig Dispense Refill   amLODipine (NORVASC) 5 MG tablet TAKE 1 TABLET BY MOUTH  DAILY 90 tablet 3   donepezil (ARICEPT) 10 MG tablet Take 1 tablet daily 90 tablet 3   fluticasone (FLONASE) 50 MCG/ACT nasal spray USE 2 SPRAYS IN BOTH  NOSTRILS DAILY AS NEEDED  FOR ALLERGIES (Patient taking differently: Place 2 sprays into both nostrils as needed.) 48 g 1   losartan (COZAAR) 100 MG tablet TAKE 1 TABLET BY MOUTH  DAILY 90 tablet  3   LUTEIN PO Take 1 capsule by mouth daily.     Multiple Vitamins-Minerals (CENTRUM PO) Take 1 tablet by mouth daily.     Multiple Vitamins-Minerals (PRESERVISION AREDS 2 PO) Take 1 capsule by mouth every morning.      polyvinyl alcohol (LIQUIFILM TEARS) 1.4 % ophthalmic solution Place 1 drop into both eyes daily as needed for dry eyes.     rosuvastatin (CRESTOR) 5 MG tablet Take 1 tablet (5 mg total) by mouth daily. 90 tablet 2   Saw Palmetto 450 MG CAPS Take 450 mg by mouth daily.      tamsulosin (FLOMAX) 0.4 MG CAPS capsule Take 1 capsule (0.4 mg total) by mouth daily. 90 capsule 3   vitamin C (ASCORBIC ACID) 500 MG tablet Take 500 mg by mouth daily.     No current facility-administered medications on file prior to visit.    Past Medical History:  Diagnosis Date   Aortic atherosclerosis ([place])    chronic--- followed by pcp   BPH without obstruction/lower urinary tract symptoms    urologist--- dr bell   COPD with emphysema ([place])    followed by pcp---  never used oxygen,  still smokes 1 ppd   Diverticulosis of colon    Full dentures    Heart murmur    per pt's pcp note 02-27-2020 in epic , she heard murmur   Hemorrhoids    History of adenomatous polyp of colon    History of concussion  per pt in [place] ,  no resiudal   History of elevated PSA    negative bx 2019   History of gastroesophageal reflux (GERD)    03-06-2020 per pt no issues for several yrs   History of pneumothorax 2017   traumatic due to fall   Hypertension    followed by pcp   Left inguinal hernia    Macular degeneration, left eye    Mild neurocognitive disorder    neruologist--- dr Delice Lesch   Nodule of right lung    lower lobe--- followed by pcp--- last chest CT 05-07-2019 stable   Pre-diabetes    Wears glasses     Past Surgical History:  Procedure Laterality Date   COLONOSCOPY  last one 04-20-2016   EVALUATION UNDER ANESTHESIA WITH HEMORRHOIDECTOMY N/A 07/01/2017   Procedure: ANORECTAL EXAMINATION  UNDER ANESTHESIA, HEMORRHOIDECTOMY, HEMORRHOIDAL LIGATION/PEXY;  Surgeon: Michael Boston, MD;  Location: [facility];  Service: General;  Laterality: N/A;  GENERAL AND LOCAL   INGUINAL HERNIA REPAIR N/A 03/12/2020   Procedure: LAPARSCOPIC LEFT INGUINAL HERNIA REPAIR; BILATERAL FEMORAL HERNIA REPAIR; BILATERAL TAP BLOCK;  Surgeon: Michael Boston, MD;  Location: [facility];  Service: General;  Laterality: N/A;   LAPAROSCOPIC ABDOMINAL EXPLORATION N/A 03/12/2020   Procedure: LAPAROSCOPIC  EXPLORATION AND REPAIRS OF HERNIAS FOUND;  Surgeon: Michael Boston, MD;  Location: [facility];  Service: General;  Laterality: N/A;   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   Procedure: TOTAL HIP ARTHROPLASTY ANTERIOR APPROACH;  Surgeon: Rod Can, MD;  Location: [facility];  Service: Orthopedics;  Laterality: Left;    Social History   Socioeconomic History   Marital status: Married    Spouse name: Not on file   Number of children: Not on file   Years of education: 14   Highest education level: Associate degree: academic program  Occupational History   Occupation: Company secretary  Tobacco Use   Smoking status: Every Day    Packs/day: 1.00    Years: 56.00    Pack years: 56.00    Types: Cigarettes, Pipe   Smokeless tobacco: Never   Tobacco comments:    03-06-2020 per pt quit pipe approx. 02/ 2021,  smoked since age 59  Vaping Use   Vaping Use: Never used  Substance and Sexual Activity   Alcohol use: Yes    Alcohol/week: 7.0 standard drinks    Types: 7 Standard drinks or equivalent per week    Comment: 03-06-2020  per pt on drink per day 2-3 oz   Drug use: Never   Sexual activity: Not on file  Other Topics Concern   Not on file  Social History Narrative   Lives with wife in a split level home      Right handed      Highest Level of edu- some college   Social Determinants of Health   Financial Resource Strain: Not on file  Food Insecurity: Not on file  Transportation Needs:  Not on file  Physical Activity: Not on file  Stress: Not on file  Social Connections: Not on file    Family History  Problem Relation Age of Onset   Heart attack Father        Early 38s   Diabetes Father    Dementia Father        Unspecified type   Diabetes Mother    Dementia Mother        Unspecified type, said to be early-stages   Liver cancer Maternal Aunt  Alzheimer's disease Sister        Symptom onset in mid to late 60s   Stroke Neg Hx    COPD Neg Hx    Colon cancer Neg Hx    Esophageal cancer Neg Hx    Rectal cancer Neg Hx    Stomach cancer Neg Hx    Asthma Neg Hx     Review of Systems  Constitutional:  Negative for appetite change, chills and fever.  Respiratory:  Negative for cough, shortness of breath and wheezing.   Cardiovascular:  Negative for chest pain, palpitations and leg swelling.  Gastrointestinal:  Negative for abdominal pain.  Genitourinary:  Negative for difficulty urinating and dysuria.  Musculoskeletal:  Negative for arthralgias and back pain.  Neurological:  Negative for dizziness, light-headedness and headaches.      Objective:   Vitals:   03/31/21 1058  BP: 126/62  Pulse: (!) 55  Temp: 98.2 F (36.8 C)  SpO2: 98%   BP Readings from Last 3 Encounters:  03/31/21 126/62  08/29/20 126/70  03/12/20 (!) 148/73   Wt Readings from Last 3 Encounters:  03/31/21 126 lb (57.2 kg)  08/29/20 129 lb (58.5 kg)  03/12/20 125 lb 1.6 oz (56.7 kg)   Body mass index is 17.09 kg/m.   Physical Exam    Constitutional: Appears well-developed and well-nourished. No distress.  HENT:  Head: Normocephalic and atraumatic.  Neck: Neck supple. No tracheal deviation present. No thyromegaly present.  No cervical lymphadenopathy Cardiovascular: Normal rate, regular rhythm and normal heart sounds.   No murmur heard. No carotid bruit .  No edema Pulmonary/Chest: Effort normal and breath sounds normal. No respiratory distress. No has no wheezes. No rales.   Skin: Skin is warm and dry. Not diaphoretic.  Psychiatric: Normal mood and affect. Behavior is normal.      Assessment & Plan:    See Problem List for Assessment and Plan of chronic medical problems.

## 2021-03-30 NOTE — Patient Instructions (Addendum)
    Flu immunization administered today.     Blood work was ordered.     Medications changes include :   none     Please followup in 6 months  

## 2021-03-31 ENCOUNTER — Encounter: Payer: Self-pay | Admitting: Internal Medicine

## 2021-03-31 ENCOUNTER — Other Ambulatory Visit: Payer: Self-pay

## 2021-03-31 ENCOUNTER — Ambulatory Visit (INDEPENDENT_AMBULATORY_CARE_PROVIDER_SITE_OTHER): Payer: Medicare Other | Admitting: Internal Medicine

## 2021-03-31 VITALS — BP 126/62 | HR 55 | Temp 98.2°F | Ht 72.0 in | Wt 126.0 lb

## 2021-03-31 DIAGNOSIS — G3184 Mild cognitive impairment, so stated: Secondary | ICD-10-CM

## 2021-03-31 DIAGNOSIS — N401 Enlarged prostate with lower urinary tract symptoms: Secondary | ICD-10-CM

## 2021-03-31 DIAGNOSIS — N138 Other obstructive and reflux uropathy: Secondary | ICD-10-CM

## 2021-03-31 DIAGNOSIS — R636 Underweight: Secondary | ICD-10-CM | POA: Insufficient documentation

## 2021-03-31 DIAGNOSIS — I10 Essential (primary) hypertension: Secondary | ICD-10-CM | POA: Diagnosis not present

## 2021-03-31 DIAGNOSIS — I700 Atherosclerosis of aorta: Secondary | ICD-10-CM

## 2021-03-31 DIAGNOSIS — J439 Emphysema, unspecified: Secondary | ICD-10-CM

## 2021-03-31 DIAGNOSIS — Z23 Encounter for immunization: Secondary | ICD-10-CM | POA: Diagnosis not present

## 2021-03-31 DIAGNOSIS — R7303 Prediabetes: Secondary | ICD-10-CM

## 2021-03-31 LAB — CBC WITH DIFFERENTIAL/PLATELET
Basophils Absolute: 0.1 10*3/uL (ref 0.0–0.1)
Basophils Relative: 0.9 % (ref 0.0–3.0)
Eosinophils Absolute: 0.2 10*3/uL (ref 0.0–0.7)
Eosinophils Relative: 1.8 % (ref 0.0–5.0)
HCT: 40.9 % (ref 39.0–52.0)
Hemoglobin: 13.5 g/dL (ref 13.0–17.0)
Lymphocytes Relative: 18.5 % (ref 12.0–46.0)
Lymphs Abs: 2 10*3/uL (ref 0.7–4.0)
MCHC: 33 g/dL (ref 30.0–36.0)
MCV: 93.2 fl (ref 78.0–100.0)
Monocytes Absolute: 0.8 10*3/uL (ref 0.1–1.0)
Monocytes Relative: 7.7 % (ref 3.0–12.0)
Neutro Abs: 7.8 10*3/uL — ABNORMAL HIGH (ref 1.4–7.7)
Neutrophils Relative %: 71.1 % (ref 43.0–77.0)
Platelets: 273 10*3/uL (ref 150.0–400.0)
RBC: 4.38 Mil/uL (ref 4.22–5.81)
RDW: 13.7 % (ref 11.5–15.5)
WBC: 10.9 10*3/uL — ABNORMAL HIGH (ref 4.0–10.5)

## 2021-03-31 LAB — COMPREHENSIVE METABOLIC PANEL
ALT: 18 U/L (ref 0–53)
AST: 22 U/L (ref 0–37)
Albumin: 4.7 g/dL (ref 3.5–5.2)
Alkaline Phosphatase: 67 U/L (ref 39–117)
BUN: 15 mg/dL (ref 6–23)
CO2: 26 mEq/L (ref 19–32)
Calcium: 10.1 mg/dL (ref 8.4–10.5)
Chloride: 97 mEq/L (ref 96–112)
Creatinine, Ser: 0.91 mg/dL (ref 0.40–1.50)
GFR: 82.35 mL/min (ref >60.00)
Glucose, Bld: 92 mg/dL (ref 70–99)
Potassium: 4 mEq/L (ref 3.5–5.1)
Sodium: 133 mEq/L — ABNORMAL LOW (ref 135–145)
Total Bilirubin: 0.6 mg/dL (ref 0.2–1.2)
Total Protein: 8 g/dL (ref 6.0–8.3)

## 2021-03-31 LAB — HEMOGLOBIN A1C: Hgb A1c MFr Bld: 5.7 % (ref 4.6–6.5)

## 2021-03-31 NOTE — Assessment & Plan Note (Signed)
Chronic Blood pressure well controlled CMP Continue amlodipine 5 mg daily, losartan 100 mg daily 

## 2021-03-31 NOTE — Assessment & Plan Note (Signed)
Chronic Continues to smoke-not motivated to really stop at this time Encourage smoking cessation Denies shortness of breath, cough or wheeze

## 2021-03-31 NOTE — Assessment & Plan Note (Signed)
Chronic His weight has decreased and he states that he does not eat much, but considers his appetite normal His wife states he does not eat that much Encouraged him to eat more-do not want him losing any more weight

## 2021-03-31 NOTE — Assessment & Plan Note (Signed)
Chronic Continue rosuvastatin 5 mg daily Lab Results  Component Value Date   LDLCALC 46 08/29/2020   LDL at goal

## 2021-03-31 NOTE — Assessment & Plan Note (Signed)
Chronic BPH with microscopic hematuria-negative evaluation by urology No difficulty emptying bladder Continue tamsulosin l 0.4 mg daily If symptoms change I will refer back to urology, but for now he will follow with me only

## 2021-03-31 NOTE — Assessment & Plan Note (Signed)
Chronic Continue Aricept 10 mg daily Today he states he feels his memory is pretty good and has no concerns

## 2021-03-31 NOTE — Assessment & Plan Note (Signed)
Chronic Check a1c Low sugar / carb diet Stressed regular exercise  

## 2021-04-01 ENCOUNTER — Other Ambulatory Visit: Payer: Self-pay | Admitting: Internal Medicine

## 2021-04-01 DIAGNOSIS — R911 Solitary pulmonary nodule: Secondary | ICD-10-CM

## 2021-04-01 NOTE — Addendum Note (Signed)
Addended by: Marcina Millard on: 04/01/2021 08:13 AM   Modules accepted: Orders

## 2021-04-08 ENCOUNTER — Ambulatory Visit: Payer: Medicare Other | Attending: Internal Medicine

## 2021-04-08 ENCOUNTER — Other Ambulatory Visit (HOSPITAL_BASED_OUTPATIENT_CLINIC_OR_DEPARTMENT_OTHER): Payer: Self-pay

## 2021-04-08 DIAGNOSIS — Z23 Encounter for immunization: Secondary | ICD-10-CM

## 2021-04-08 MED ORDER — PFIZER COVID-19 VAC BIVALENT 30 MCG/0.3ML IM SUSP
INTRAMUSCULAR | 0 refills | Status: DC
  Filled 2021-04-08: qty 0.3, 1d supply, fill #0

## 2021-04-08 NOTE — Progress Notes (Signed)
   Covid-19 Vaccination Clinic  Name:  Hunter Boone    MRN: [id number] DOB: [birth year]  04/08/2021  [name] was observed post Covid-19 immunization for 15 minutes without incident. He was provided with Vaccine Information Sheet and instruction to access the V-Safe system.   [name] was instructed to call 911 with any severe reactions post vaccine: Difficulty breathing  Swelling of face and throat  A fast heartbeat  A bad rash all over body  Dizziness and weakness   Immunizations Administered     Name Date Dose VIS Date Route   Pfizer Covid-19 Vaccine Bivalent Booster 04/08/2021 10:35 AM 0.3 mL 02/11/2021 Intramuscular   Manufacturer: [place]   Lot: [id number]   [place]: [phone]

## 2021-04-09 ENCOUNTER — Ambulatory Visit (INDEPENDENT_AMBULATORY_CARE_PROVIDER_SITE_OTHER)
Admission: RE | Admit: 2021-04-09 | Discharge: 2021-04-09 | Disposition: A | Discharge status: Home / Self Care | Payer: Medicare Other | Source: Ambulatory Visit | Attending: Internal Medicine | Admitting: Internal Medicine

## 2021-04-09 ENCOUNTER — Other Ambulatory Visit: Payer: Self-pay

## 2021-04-09 DIAGNOSIS — R911 Solitary pulmonary nodule: Secondary | ICD-10-CM

## 2021-04-09 DIAGNOSIS — J439 Emphysema, unspecified: Secondary | ICD-10-CM | POA: Diagnosis not present

## 2021-04-09 DIAGNOSIS — I700 Atherosclerosis of aorta: Secondary | ICD-10-CM | POA: Diagnosis not present

## 2021-04-14 ENCOUNTER — Other Ambulatory Visit: Payer: Self-pay | Admitting: Internal Medicine

## 2021-04-14 DIAGNOSIS — R911 Solitary pulmonary nodule: Secondary | ICD-10-CM

## 2021-05-11 ENCOUNTER — Telehealth: Payer: Self-pay | Admitting: Internal Medicine

## 2021-05-11 NOTE — Telephone Encounter (Signed)
Patient's son Randall Hiss called regarding his father health. He has some concerns for his mothers safety due to the patient's change of behavior. Stated the patient is not willing to admit "anything and will deny any accusation." Offered an OV but relayed the pt would have to be present for the visit. Randall Hiss denied OV stating him and his mother needed to discuss these issues without patient being present. He didn't feel comfortable relaying those concerns over the phone so he wanted to send an e-mail to provide more details.

## 2021-06-03 ENCOUNTER — Other Ambulatory Visit: Payer: Self-pay | Admitting: Internal Medicine

## 2021-06-03 DIAGNOSIS — I10 Essential (primary) hypertension: Secondary | ICD-10-CM

## 2021-06-19 ENCOUNTER — Encounter: Payer: Self-pay | Admitting: Gastroenterology

## 2021-08-10 ENCOUNTER — Other Ambulatory Visit: Payer: Self-pay | Admitting: Internal Medicine

## 2021-09-03 ENCOUNTER — Ambulatory Visit (INDEPENDENT_AMBULATORY_CARE_PROVIDER_SITE_OTHER): Payer: Medicare Other

## 2021-09-03 VITALS — Wt 126.0 lb

## 2021-09-03 DIAGNOSIS — Z0000 Encounter for general adult medical examination without abnormal findings: Secondary | ICD-10-CM | POA: Diagnosis not present

## 2021-09-03 NOTE — Progress Notes (Signed)
? ?Subjective:  ? Hunter Boone is a 76 y.o. male who presents for Medicare Annual/Subsequent preventive examination. ? ?Virtual Visit via Telephone Note ? ?I connected with  Hunter Boone on 09/03/21 at  1:40 PM EDT by telephone and verified that I am speaking with the correct person using two identifiers. ? ?Location: ?Patient: Home ?Provider: [place] ?Persons participating in the virtual visit: patient/Nurse Health Advisor ?  ?I discussed the limitations, risks, security and privacy concerns of performing an evaluation and management service by telephone and the availability of in person appointments. The patient expressed understanding and agreed to proceed. ? ?Interactive audio and video telecommunications were attempted between this nurse and patient, however failed, due to patient having technical difficulties OR patient did not have access to video capability.  We continued and completed visit with audio only. ? ?Some vital signs may be absent or patient reported.  ? ?Julian Askin Dionne Ano, LPN  ? ?Review of Systems    ? ?Cardiac Risk Factors include: advanced age (>71mn, >>60women);male gender;hypertension;smoking/ tobacco exposure;sedentary lifestyle;Other (see comment), Risk factor comments: atherosclerosis, pre-diabetes, underweight, COPD ? ?   ?Objective:  ?  ?Today's Vitals  ? 09/03/21 1332  ?Weight: 126 lb (57.2 kg)  ? ?Body mass index is 17.09 kg/m?. ? ? ?  09/03/2021  ?  1:36 PM 03/12/2020  ?  9:39 AM 09/05/2019  ? 10:11 AM 12/18/2018  ?  1:00 AM 12/17/2018  ?  9:44 PM 06/29/2017  ? 10:04 AM 05/24/2016  ?  9:00 PM  ?Advanced Directives  ?Does Patient Have a Medical Advance Directive? No No No No No No No  ?Would patient like information on creating a medical advance directive? No - Patient declined Yes (MAU/Ambulatory/Procedural Areas - Information given)  No - Patient declined No - Patient declined  No - Patient declined  ? ? ?Current Medications (verified) ?Outpatient Encounter Medications as of  09/03/2021  ?Medication Sig  ? amLODipine (NORVASC) 5 MG tablet TAKE 1 TABLET BY MOUTH  DAILY  ? donepezil (ARICEPT) 10 MG tablet Take 1 tablet daily  ? fluticasone (FLONASE) 50 MCG/ACT nasal spray USE 2 SPRAYS IN BOTH  NOSTRILS DAILY AS NEEDED  FOR ALLERGIES (Patient taking differently: Place 2 sprays into both nostrils as needed.)  ? losartan (COZAAR) 100 MG tablet TAKE 1 TABLET BY MOUTH  DAILY  ? LUTEIN PO Take 1 capsule by mouth daily.  ? Multiple Vitamins-Minerals (CENTRUM PO) Take 1 tablet by mouth daily.  ? Multiple Vitamins-Minerals (PRESERVISION AREDS 2 PO) Take 1 capsule by mouth every morning.   ? rosuvastatin (CRESTOR) 5 MG tablet TAKE 1 TABLET BY MOUTH  DAILY  ? Saw Palmetto 450 MG CAPS Take 450 mg by mouth daily.   ? tamsulosin (FLOMAX) 0.4 MG CAPS capsule Take 1 capsule (0.4 mg total) by mouth daily.  ? vitamin C (ASCORBIC ACID) 500 MG tablet Take 500 mg by mouth daily.  ? ?No facility-administered encounter medications on file as of 09/03/2021.  ? ? ?Allergies (verified) ?Patient has no known allergies.  ? ?History: ?Past Medical History:  ?Diagnosis Date  ? [place]   ? chronic--- followed by pcp  ? BPH without obstruction/lower urinary tract symptoms   ? urologist--- dr bell  ? COPD with [place]ysema [place]   ? followed by pcp---  never used oxygen,  still smokes 1 ppd  ? Diverticulosis of colon   ? Full dentures   ? Heart murmur   ? per  pt's pcp note 02-27-2020 in epic , she heard murmur  ? Hemorrhoids   ? History of adenomatous polyp of colon   ? History of concussion   ? per pt in [place] ,  no resiudal  ? History of elevated PSA   ? negative bx 2019  ? History of gastroesophageal reflux (GERD)   ? 03-06-2020 per pt no issues for several yrs  ? History of pneumothorax 2017  ? traumatic due to fall  ? Hypertension   ? followed by pcp  ? Left inguinal hernia   ? Macular degeneration, left eye   ? Mild neurocognitive disorder   ? neruologist--- dr Delice Lesch  ? Nodule of right lung   ?  lower lobe--- followed by pcp--- last chest CT 05-07-2019 stable  ? Pre-diabetes   ? Wears glasses   ? ?Past Surgical History:  ?Procedure Laterality Date  ? COLONOSCOPY  last one 04-20-2016  ? EVALUATION UNDER ANESTHESIA WITH HEMORRHOIDECTOMY N/A 07/01/2017  ? Procedure: ANORECTAL EXAMINATION UNDER ANESTHESIA, HEMORRHOIDECTOMY, HEMORRHOIDAL LIGATION/PEXY;  Surgeon: Michael Boston, MD;  [facility];  Service: General;  Laterality: N/A;  GENERAL AND LOCAL  ? INGUINAL HERNIA REPAIR N/A 03/12/2020  ? Procedure: LAPARSCOPIC LEFT INGUINAL HERNIA REPAIR; BILATERAL FEMORAL HERNIA REPAIR; BILATERAL TAP BLOCK;  Surgeon: Michael Boston, MD;  Location: [facility];  Service: General;  Laterality: N/A;  ? LAPAROSCOPIC ABDOMINAL EXPLORATION N/A 03/12/2020  ? Procedure: LAPAROSCOPIC  EXPLORATION AND REPAIRS OF HERNIAS FOUND;  Surgeon: Michael Boston, MD;  Location: [facility];  Service: General;  Laterality: N/A;  ? 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  ? Procedure: TOTAL HIP ARTHROPLASTY ANTERIOR APPROACH;  Surgeon: Rod Can, MD;  Location: [facility];  Service: Orthopedics;  Laterality: Left;  ? ?Family History  ?Problem Relation Age of Onset  ? Heart attack Father   ?     Early 76s  ? Diabetes Father   ? Dementia Father   ?     Unspecified type  ? Diabetes Mother   ? Dementia Mother   ?     Unspecified type, said to be early-stages  ? Liver cancer Maternal Aunt   ? Alzheimer's disease Sister   ?     Symptom onset in mid to late 60s  ? Stroke Neg Hx   ? COPD Neg Hx   ? Colon cancer Neg Hx   ? Esophageal cancer Neg Hx   ? Rectal cancer Neg Hx   ? Stomach cancer Neg Hx   ? Asthma Neg Hx   ? ?Social History  ? ?Socioeconomic History  ? Marital status: Married  ?  Spouse name: Not on file  ? Number of children: Not on file  ? Years of education: 46  ? Highest education level: Associate degree: academic program  ?Occupational History  ? Occupation: Company secretary  ?Tobacco Use  ? Smoking status: Every Day   ?  Packs/day: 1.00  ?  Years: 56.00  ?  Pack years: 56.00  ?  Types: Cigarettes, Pipe  ? Smokeless tobacco: Never  ? Tobacco comments:  ?  03-06-2020 per pt quit pipe approx. 02/ 2021,  smoked since age 42  ?Vaping Use  ? Vaping Use: Never used  ?Substance and Sexual Activity  ? Alcohol use: Yes  ?  Alcohol/week: 7.0 standard drinks  ?  Types: 7 Standard drinks or equivalent per week  ?  Comment: 03-06-2020  per pt on drink per day 2-3 oz  ? Drug use:  Never  ? Sexual activity: Not on file  ?Other Topics Concern  ? Not on file  ?Social History Narrative  ? Lives with wife in a split level home  ?   ? Right handed  ?   ? Highest Level of edu- some college  ? ?Social Determinants of Health  ? ?Financial Resource Strain: Low Risk   ? Difficulty of Paying Living Expenses: Not hard at all  ?Food Insecurity: No Food Insecurity  ? Worried About Charity fundraiser in the Last Year: Never true  ? Ran Out of Food in the Last Year: Never true  ?Transportation Needs: No Transportation Needs  ? Lack of Transportation (Medical): No  ? Lack of Transportation (Non-Medical): No  ?Physical Activity: Insufficiently Active  ? Days of Exercise per Week: 3 days  ? Minutes of Exercise per Session: 30 min  ?Stress: No Stress Concern Present  ? Feeling of Stress : Only a little  ?Social Connections: Socially Integrated  ? Frequency of Communication with Friends and Family: More than three times a week  ? Frequency of Social Gatherings with Friends and Family: More than three times a week  ? Attends Religious Services: More than 4 times per year  ? Active Member of Clubs or Organizations: Yes  ? Attends Archivist Meetings: More than 4 times per year  ? Marital Status: Married  ? ? ?Tobacco Counseling ?Ready to quit: Not Answered ?Counseling given: Not Answered ?Tobacco comments: 03-06-2020 per pt quit pipe approx. 02/ 2021,  smoked since age 37 ? ? ?Clinical Intake: ? ?Pre-visit preparation completed: Yes ? ?Pain : No/denies  pain ? ?  ? ?BMI - recorded: 17.09 ?Nutritional Status: BMI <19  Underweight ?Nutritional Risks: Unintentional weight loss ?Diabetes: No ? ?How often do you need to have someone help you when you read instruc

## 2021-09-03 NOTE — Patient Instructions (Signed)
Hunter Boone , ?Thank you for taking time to come for your Medicare Wellness Visit. I appreciate your ongoing commitment to your health goals. Please review the following plan we discussed and let me know if I can assist you in the future.  ? ?Screening recommendations/referrals: ?Colonoscopy: Done 04/20/2016 - Repeat in 5 years *due now - make appointment soon ?Recommended yearly ophthalmology/optometry visit for glaucoma screening and checkup ?Recommended yearly dental visit for hygiene and checkup ? ?Vaccinations: ?Influenza vaccine: Done 03/31/2021 - Repeat annually ?Pneumococcal vaccine: Done 08/19/2010 & 06/11/2016 - ask about [id number] ?Tdap vaccine: Done 06/05/2012 - Repeat in 10 years *end of this year ?Shingles vaccine: Done 07/13/2018 & 02/09/2019   ?Covid-19: Declined ? ?Advanced directives: Please bring a copy of your health care power of attorney and living will to the office to be added to your chart at your convenience.  ? ?Conditions/risks identified: Aim for 30 minutes of exercise or brisk walking, 6-8 glasses of water, and 5 servings of fruits and vegetables each day.  ? ?Next appointment: Follow up in one year for your annual wellness visit.  ? ?Preventive Care 16 Years and Older, Male ? ?Preventive care refers to lifestyle choices and visits with your health care provider that can promote health and wellness. ?What does preventive care include? ?A yearly physical exam. This is also called an annual well check. ?Dental exams once or twice a year. ?Routine eye exams. Ask your health care provider how often you should have your eyes checked. ?Personal lifestyle choices, including: ?Daily care of your teeth and gums. ?Regular physical activity. ?Eating a healthy diet. ?Avoiding tobacco and drug use. ?Limiting alcohol use. ?Practicing safe sex. ?Taking low doses of aspirin every day. ?Taking vitamin and mineral supplements as recommended by your health care provider. ?What happens during an annual well  check? ?The services and screenings done by your health care provider during your annual well check will depend on your age, overall health, lifestyle risk factors, and family history of disease. ?Counseling  ?Your health care provider may ask you questions about your: ?Alcohol use. ?Tobacco use. ?Drug use. ?Emotional well-being. ?Home and relationship well-being. ?Sexual activity. ?Eating habits. ?History of falls. ?Memory and ability to understand (cognition). ?Work and work Statistician. ?Screening  ?You may have the following tests or measurements: ?Height, weight, and BMI. ?Blood pressure. ?Lipid and cholesterol levels. These may be checked every 5 years, or more frequently if you are over 24 years old. ?Skin check. ?Lung cancer screening. You may have this screening every year starting at age 81 if you have a 30-pack-year history of smoking and currently smoke or have quit within the past 15 years. ?Fecal occult blood test (FOBT) of the stool. You may have this test every year starting at age 35. ?Flexible sigmoidoscopy or colonoscopy. You may have a sigmoidoscopy every 5 years or a colonoscopy every 10 years starting at age 68. ?Prostate cancer screening. Recommendations will vary depending on your family history and other risks. ?Hepatitis C blood test. ?Hepatitis B blood test. ?Sexually transmitted disease (STD) testing. ?Diabetes screening. This is done by checking your blood sugar (glucose) after you have not eaten for a while (fasting). You may have this done every 1-3 years. ?Abdominal aortic aneurysm (AAA) screening. You may need this if you are a current or former smoker. ?Osteoporosis. You may be screened starting at age 66 if you are at high risk. ?Talk with your health care provider about your test results, treatment options, and if necessary,  the need for more tests. ?Vaccines  ?Your health care provider may recommend certain vaccines, such as: ?Influenza vaccine. This is recommended every  year. ?Tetanus, diphtheria, and acellular pertussis (Tdap, Td) vaccine. You may need a Td booster every 10 years. ?Zoster vaccine. You may need this after age 22. ?Pneumococcal 13-valent conjugate (PCV13) vaccine. One dose is recommended after age 24. ?Pneumococcal polysaccharide (PPSV23) vaccine. One dose is recommended after age 84. ?Talk to your health care provider about which screenings and vaccines you need and how often you need them. ?This information is not intended to replace advice given to you by your health care provider. Make sure you discuss any questions you have with your health care provider. ?Document Released: 06/27/2015 Document Revised: 02/18/2016 Document Reviewed: 04/01/2015 ?Elsevier Interactive Patient Education ? 2017 [employer]. ? ?Fall Prevention in the Home ?Falls can cause injuries. They can happen to people of all ages. There are many things you can do to make your home safe and to help prevent falls. ?What can I do on the outside of my home? ?Regularly fix the edges of walkways and driveways and fix any cracks. ?Remove anything that might make you trip as you walk through a door, such as a raised step or threshold. ?Trim any bushes or trees on the path to your home. ?Use bright outdoor lighting. ?Clear any walking paths of anything that might make someone trip, such as rocks or tools. ?Regularly check to see if handrails are loose or broken. Make sure that both sides of any steps have handrails. ?Any raised decks and porches should have guardrails on the edges. ?Have any leaves, snow, or ice cleared regularly. ?Use sand or salt on walking paths during winter. ?Clean up any spills in your garage right away. This includes oil or grease spills. ?What can I do in the bathroom? ?Use night lights. ?Install grab bars by the toilet and in the tub and shower. Do not use towel bars as grab bars. ?Use non-skid mats or decals in the tub or shower. ?If you need to sit down in the shower, use a  plastic, non-slip stool. ?Keep the floor dry. Clean up any water that spills on the floor as soon as it happens. ?Remove soap buildup in the tub or shower regularly. ?Attach bath mats securely with double-sided non-slip rug tape. ?Do not have throw rugs and other things on the floor that can make you trip. ?What can I do in the bedroom? ?Use night lights. ?Make sure that you have a light by your bed that is easy to reach. ?Do not use any sheets or blankets that are too big for your bed. They should not hang down onto the floor. ?Have a firm chair that has side arms. You can use this for support while you get dressed. ?Do not have throw rugs and other things on the floor that can make you trip. ?What can I do in the kitchen? ?Clean up any spills right away. ?Avoid walking on wet floors. ?Keep items that you use a lot in easy-to-reach places. ?If you need to reach something above you, use a strong step stool that has a grab bar. ?Keep electrical cords out of the way. ?Do not use floor polish or wax that makes floors slippery. If you must use wax, use non-skid floor wax. ?Do not have throw rugs and other things on the floor that can make you trip. ?What can I do with my stairs? ?Do not leave any items on  the stairs. ?Make sure that there are handrails on both sides of the stairs and use them. Fix handrails that are broken or loose. Make sure that handrails are as long as the stairways. ?Check any carpeting to make sure that it is firmly attached to the stairs. Fix any carpet that is loose or worn. ?Avoid having throw rugs at the top or bottom of the stairs. If you do have throw rugs, attach them to the floor with carpet tape. ?Make sure that you have a light switch at the top of the stairs and the bottom of the stairs. If you do not have them, ask someone to add them for you. ?What else can I do to help prevent falls? ?Wear shoes that: ?Do not have high heels. ?Have rubber bottoms. ?Are comfortable and fit you  well. ?Are closed at the toe. Do not wear sandals. ?If you use a stepladder: ?Make sure that it is fully opened. Do not climb a closed stepladder. ?Make sure that both sides of the stepladder are locked into place. ?Ask

## 2021-09-29 ENCOUNTER — Encounter: Payer: Self-pay | Admitting: Internal Medicine

## 2021-09-29 NOTE — Progress Notes (Signed)
? ? ? ? ?Subjective:  ? ? Patient ID: Hunter Boone, male    DOB: Mar 23, 1946, 76 y.o.   MRN: [id number] ? ?This visit occurred during the SARS-CoV-2 public health emergency.  Safety protocols were in place, including screening questions prior to the visit, additional usage of staff PPE, and extensive cleaning of exam room while observing appropriate contact time as indicated for disinfecting solutions.   ? ? ?HPI ?Drelyn is here for follow up of his chronic medical problems, including htn, prediabetes, COPD, mild neurocognitive d/o, tabacco abuse, BPH, lung nodule - due for repeat Ct 04/11/22 ? ?He is here with his wife.   ? ?Walking, eating well.  Sleep is good.   ? ?No has been more issues with his memory.  He is taking his own medication.  On occasion this asked his wife if they were married.  His short-term memory and recall is extremely poor.  He asked the same question over and over and forgets about appointments or get some days and times mixed up.  He typically drives only to places he knows how to get to, but some of those places he does have to ask his wife how to get there.  He has forgotten who some of his grandchildren are ? ?His wife has noticed that he is breathing a little different-breathing out of his mouth may be more prolonged expiration.  He is still smoking and does not have a desire to quit. ? ?Medications and allergies reviewed with patient and updated if appropriate. ? ?Current Outpatient Medications on File Prior to Visit  ?Medication Sig Dispense Refill  ? amLODipine (NORVASC) 5 MG tablet TAKE 1 TABLET BY MOUTH  DAILY 90 tablet 3  ? donepezil (ARICEPT) 10 MG tablet Take 1 tablet daily 90 tablet 3  ? fluticasone (FLONASE) 50 MCG/ACT nasal spray USE 2 SPRAYS IN BOTH  NOSTRILS DAILY AS NEEDED  FOR ALLERGIES (Patient taking differently: Place 2 sprays into both nostrils as needed.) 48 g 1  ? losartan (COZAAR) 100 MG tablet TAKE 1 TABLET BY MOUTH  DAILY 90 tablet 3  ? Multiple Vitamins-Minerals  (CENTRUM PO) Take 1 tablet by mouth daily.    ? Multiple Vitamins-Minerals (PRESERVISION AREDS 2 PO) Take 1 capsule by mouth every morning.     ? rosuvastatin (CRESTOR) 5 MG tablet TAKE 1 TABLET BY MOUTH  DAILY 90 tablet 3  ? tamsulosin (FLOMAX) 0.4 MG CAPS capsule Take 1 capsule (0.4 mg total) by mouth daily. 90 capsule 3  ? vitamin C (ASCORBIC ACID) 500 MG tablet Take 500 mg by mouth daily.    ? ?No current facility-administered medications on file prior to visit.  ? ? ? ?Review of Systems  ?Constitutional:  Negative for appetite change and fever.  ?Respiratory:  Negative for cough, shortness of breath and wheezing.   ?Cardiovascular:  Negative for chest pain, palpitations and leg swelling.  ?Gastrointestinal:  Negative for abdominal pain, constipation and diarrhea.  ?Genitourinary:  Negative for difficulty urinating and dysuria.  ?Neurological:  Negative for light-headedness and headaches.  ?Psychiatric/Behavioral:  Negative for dysphoric mood and sleep disturbance. The patient is not nervous/anxious.   ? ?   ?Objective:  ? ?Vitals:  ? 09/30/21 1043  ?BP: 118/80  ?Pulse: 66  ?Temp: 97.9 ?F (36.6 ?C)  ?SpO2: 99%  ? ?BP Readings from Last 3 Encounters:  ?09/30/21 118/80  ?03/31/21 126/62  ?08/29/20 126/70  ? ?Wt Readings from Last 3 Encounters:  ?09/30/21 125 lb (56.7 kg)  ?  09/03/21 126 lb (57.2 kg)  ?03/31/21 126 lb (57.2 kg)  ? ?Body mass index is 16.95 kg/m?. ? ?  ?Physical Exam ?Constitutional:   ?   General: He is not in acute distress. ?   Appearance: Normal appearance. He is not ill-appearing.  ?HENT:  ?   Head: Normocephalic and atraumatic.  ?Eyes:  ?   Conjunctiva/sclera: Conjunctivae normal.  ?Cardiovascular:  ?   Rate and Rhythm: Normal rate and regular rhythm.  ?   Heart sounds: Normal heart sounds. No murmur heard. ?Pulmonary:  ?   Effort: Pulmonary effort is normal. No respiratory distress.  ?   Breath sounds: Normal breath sounds. No wheezing or rales.  ?Musculoskeletal:  ?   Right lower leg: No  edema.  ?   Left lower leg: No edema.  ?Skin: ?   General: Skin is warm and dry.  ?   Findings: No rash.  ?Neurological:  ?   Mental Status: He is alert. Mental status is at baseline.  ?Psychiatric:     ?   Mood and Affect: Mood normal.  ? ?   ? ?Lab Results  ?Component Value Date  ? WBC 10.9 (H) 03/31/2021  ? HGB 13.5 03/31/2021  ? HCT 40.9 03/31/2021  ? PLT 273.0 03/31/2021  ? GLUCOSE 92 03/31/2021  ? CHOL 142 08/29/2020  ? TRIG 119.0 08/29/2020  ? HDL 71.90 08/29/2020  ? LDLCALC 46 08/29/2020  ? ALT 18 03/31/2021  ? AST 22 03/31/2021  ? NA 133 (L) 03/31/2021  ? K 4.0 03/31/2021  ? CL 97 03/31/2021  ? CREATININE 0.91 03/31/2021  ? BUN 15 03/31/2021  ? CO2 26 03/31/2021  ? TSH 2.38 02/09/2019  ? PSA 9.52 (H) 08/16/2017  ? INR 1.01 05/24/2016  ? HGBA1C 5.7 03/31/2021  ? ? ? ?Assessment & Plan:  ? ? ?See Problem List for Assessment and Plan of chronic medical problems.  ? ? ?

## 2021-09-29 NOTE — Patient Instructions (Addendum)
? ? ?  Dr Delice Lesch - neurology --  [phone] ? ?Dr Fuller Plan - GI [phone] ? ? ? ?Blood work was ordered.   ? ? ?Medications changes include :   start namenda 5 mg twice daily - this is for memory ? ? ?Your prescription(s) have been sent to your pharmacy.  ? ? ? ? ?Return in about 6 months (around 04/01/2022) for follow up. ? ?

## 2021-09-30 ENCOUNTER — Ambulatory Visit (INDEPENDENT_AMBULATORY_CARE_PROVIDER_SITE_OTHER): Payer: Medicare Other | Admitting: Internal Medicine

## 2021-09-30 VITALS — BP 118/80 | HR 66 | Temp 97.9°F | Ht 72.0 in | Wt 125.0 lb

## 2021-09-30 DIAGNOSIS — I10 Essential (primary) hypertension: Secondary | ICD-10-CM

## 2021-09-30 DIAGNOSIS — F17210 Nicotine dependence, cigarettes, uncomplicated: Secondary | ICD-10-CM

## 2021-09-30 DIAGNOSIS — N138 Other obstructive and reflux uropathy: Secondary | ICD-10-CM | POA: Diagnosis not present

## 2021-09-30 DIAGNOSIS — G3184 Mild cognitive impairment, so stated: Secondary | ICD-10-CM | POA: Diagnosis not present

## 2021-09-30 DIAGNOSIS — I700 Atherosclerosis of aorta: Secondary | ICD-10-CM

## 2021-09-30 DIAGNOSIS — R7303 Prediabetes: Secondary | ICD-10-CM

## 2021-09-30 DIAGNOSIS — Z720 Tobacco use: Secondary | ICD-10-CM

## 2021-09-30 DIAGNOSIS — N401 Enlarged prostate with lower urinary tract symptoms: Secondary | ICD-10-CM

## 2021-09-30 LAB — CBC WITH DIFFERENTIAL/PLATELET
Basophils Absolute: 0.1 10*3/uL (ref 0.0–0.1)
Basophils Relative: 1.1 % (ref 0.0–3.0)
Eosinophils Absolute: 0.2 10*3/uL (ref 0.0–0.7)
Eosinophils Relative: 2.9 % (ref 0.0–5.0)
HCT: 40.7 % (ref 39.0–52.0)
Hemoglobin: 13.4 g/dL (ref 13.0–17.0)
Lymphocytes Relative: 27.8 % (ref 12.0–46.0)
Lymphs Abs: 2.3 10*3/uL (ref 0.7–4.0)
MCHC: 33.1 g/dL (ref 30.0–36.0)
MCV: 92.9 fl (ref 78.0–100.0)
Monocytes Absolute: 0.8 10*3/uL (ref 0.1–1.0)
Monocytes Relative: 9.7 % (ref 3.0–12.0)
Neutro Abs: 4.9 10*3/uL (ref 1.4–7.7)
Neutrophils Relative %: 58.5 % (ref 43.0–77.0)
Platelets: 240 10*3/uL (ref 150.0–400.0)
RBC: 4.38 Mil/uL (ref 4.22–5.81)
RDW: 14.2 % (ref 11.5–15.5)
WBC: 8.4 10*3/uL (ref 4.0–10.5)

## 2021-09-30 LAB — COMPREHENSIVE METABOLIC PANEL
ALT: 20 U/L (ref 0–53)
AST: 22 U/L (ref 0–37)
Albumin: 4.4 g/dL (ref 3.5–5.2)
Alkaline Phosphatase: 64 U/L (ref 39–117)
BUN: 20 mg/dL (ref 6–23)
CO2: 28 mEq/L (ref 19–32)
Calcium: 9.7 mg/dL (ref 8.4–10.5)
Chloride: 101 mEq/L (ref 96–112)
Creatinine, Ser: 0.99 mg/dL (ref 0.40–1.50)
GFR: 74.17 mL/min (ref >60.00)
Glucose, Bld: 89 mg/dL (ref 70–99)
Potassium: 4 mEq/L (ref 3.5–5.1)
Sodium: 136 mEq/L (ref 135–145)
Total Bilirubin: 0.7 mg/dL (ref 0.2–1.2)
Total Protein: 7.6 g/dL (ref 6.0–8.3)

## 2021-09-30 LAB — LIPID PANEL
Cholesterol: 172 mg/dL (ref 0–200)
HDL: 66.9 mg/dL (ref >39.00)
LDL Cholesterol: 86 mg/dL (ref 0–99)
NonHDL: 105.11
Total CHOL/HDL Ratio: 3
Triglycerides: 95 mg/dL (ref 0.0–149.0)
VLDL: 19 mg/dL (ref 0.0–40.0)

## 2021-09-30 LAB — VITAMIN B12: Vitamin B-12: 401 pg/mL (ref 211–911)

## 2021-09-30 LAB — HEMOGLOBIN A1C: Hgb A1c MFr Bld: 5.7 % (ref 4.6–6.5)

## 2021-09-30 LAB — TSH: TSH: 2.46 u[IU]/mL (ref 0.35–5.50)

## 2021-09-30 MED ORDER — DONEPEZIL HCL 10 MG PO TABS: 10.0000 mg | ORAL_TABLET | Freq: Every day | ORAL | 3 refills | Status: DC

## 2021-09-30 MED ORDER — MEMANTINE HCL 5 MG PO TABS: 5.0000 mg | ORAL_TABLET | Freq: Two times a day (BID) | ORAL | 5 refills | Status: DC

## 2021-09-30 NOTE — Assessment & Plan Note (Signed)
Chronic Check a1c Low sugar / carb diet Stressed regular exercise  

## 2021-09-30 NOTE — Assessment & Plan Note (Signed)
Chronic ?No difficulty urinating/emptying bladder ?Continue Flomax 0.4 mg daily ?

## 2021-09-30 NOTE — Assessment & Plan Note (Signed)
Chronic Blood pressure well controlled CMP Continue amlodipine 5 mg daily, losartan 100 mg daily 

## 2021-09-30 NOTE — Assessment & Plan Note (Addendum)
Chronic ?Encouraged smoking cessation-he has no desire to quit smoking at this time ?

## 2021-09-30 NOTE — Assessment & Plan Note (Signed)
Chronic ?Continue rosuvastatin 5 mg daily ?Check lipid panel ?Encouraged healthy diet, regular exercise ?

## 2021-09-30 NOTE — Assessment & Plan Note (Addendum)
Chronic ?Has progressed per family-discussed with him and his wife that this progression is expected.  All of the things that they have told me is consistent with dementia. ?Encouraged regular exercise-both physical and mental ?Continue Aricept 10 mg daily ?Advised following up with Dr. Irwin Brakeman wife will make an appointment ?We will start Namenda.  Discussed possible side effects ? ?

## 2021-10-01 ENCOUNTER — Encounter: Payer: Self-pay | Admitting: Physician Assistant

## 2021-10-01 ENCOUNTER — Ambulatory Visit: Payer: Medicare Other | Admitting: Physician Assistant

## 2021-10-01 VITALS — BP 148/84 | HR 84 | Resp 20 | Ht 69.0 in | Wt 126.0 lb

## 2021-10-01 DIAGNOSIS — F0280 Dementia in other diseases classified elsewhere without behavioral disturbance: Secondary | ICD-10-CM

## 2021-10-01 DIAGNOSIS — R413 Other amnesia: Secondary | ICD-10-CM

## 2021-10-01 DIAGNOSIS — G309 Alzheimer's disease, unspecified: Secondary | ICD-10-CM | POA: Diagnosis not present

## 2021-10-01 NOTE — Patient Instructions (Addendum)
.It was a pleasure to see you today at our office.  ? ?Recommendations: ? ? ? ?Feel free to visit Facebook page " [employer]" for tips of how to care for people with memory problems.  ?Continue donepezil 10 mg daily.   ?Continue Memantine 5 mg tablets.  Take 1 tablet at bedtime for 2 weeks, then 1 tablet twice daily.   Side effects include dizziness, headache, diarrhea or constipation.   ?MRI brain  ?Replenish B12  ?Follow up in 3 months ? ? ?RECOMMENDATIONS FOR ALL PATIENTS WITH MEMORY PROBLEMS: ?1. Continue to exercise (Recommend 30 minutes of walking everyday, or 3 hours every week) ?2. Increase social interactions - continue going to [place] and enjoy social gatherings with friends and family ?3. Eat healthy, avoid fried foods and eat more fruits and vegetables ?4. Maintain adequate blood pressure, blood sugar, and blood cholesterol level. Reducing the risk of stroke and cardiovascular disease also helps promoting better memory. ?5. Avoid stressful situations. Live a simple life and avoid aggravations. Organize your time and prepare for the next day in anticipation. ?6. Sleep well, avoid any interruptions of sleep and avoid any distractions in the bedroom that may interfere with adequate sleep quality ?7. Avoid sugar, avoid sweets as there is a strong link between excessive sugar intake, diabetes, and cognitive impairment ?We discussed the Mediterranean diet, which has been shown to help patients reduce the risk of progressive memory disorders and reduces cardiovascular risk. This includes eating fish, eat fruits and green leafy vegetables, nuts like almonds and hazelnuts, walnuts, and also use olive oil. Avoid fast foods and fried foods as much as possible. Avoid sweets and sugar as sugar use has been linked to worsening of memory function. ? ?There is always a concern of gradual progression of memory problems. If this is the case, then we may need to adjust level of care according to patient needs. Support, both to  the patient and caregiver, should then be put into place.  ? ? ?The Alzheimer?s Association is here all day, every day for people facing Alzheimer?s disease through our free 24/7 Helpline: [phone]. The Helpline provides reliable information and support to all those who need assistance, such as individuals living with memory loss, Alzheimer's or other dementia, caregivers, health care professionals and the public.  ?Our highly trained and knowledgeable staff can help you with: ?Understanding memory loss, dementia and Alzheimer's  ?Medications and other treatment options  ?General information about aging and brain health  ?Skills to provide quality care and to find the best care from professionals  ?Legal, financial and living-arrangement decisions ?Our Helpline also features: ?Confidential care consultation provided by master's level clinicians who can help with decision-making support, crisis assistance and education on issues families face every day  ?Help in a caller's preferred language using our translation service that features more than 200 languages and dialects  ?Referrals to local community programs, services and ongoing support ? ? ? ? ?FALL PRECAUTIONS: Be cautious when walking. Scan the area for obstacles that may increase the risk of trips and falls. When getting up in the mornings, sit up at the edge of the bed for a few minutes before getting out of bed. Consider elevating the bed at the head end to avoid drop of blood pressure when getting up. Walk always in a well-lit room (use night lights in the walls). Avoid area rugs or power cords from appliances in the middle of the walkways. Use a walker or a cane if necessary and  consider physical therapy for balance exercise. Get your eyesight checked regularly. ? ?FINANCIAL OVERSIGHT: Supervision, especially oversight when making financial decisions or transactions is also recommended. ? ?HOME SAFETY: Consider the safety of the kitchen when operating  appliances like stoves, microwave oven, and blender. Consider having supervision and share cooking responsibilities until no longer able to participate in those. Accidents with firearms and other hazards in the house should be identified and addressed as well. ? ? ?ABILITY TO BE LEFT ALONE: If patient is unable to contact 911 operator, consider using LifeLine, or when the need is there, arrange for someone to stay with patients. Smoking is a fire hazard, consider supervision or cessation. Risk of wandering should be assessed by caregiver and if detected at any point, supervision and safe proof recommendations should be instituted. ? ?MEDICATION SUPERVISION: Inability to self-administer medication needs to be constantly addressed. Implement a mechanism to ensure safe administration of the medications. ? ? ?DRIVING: Regarding driving, in patients with progressive memory problems, driving will be impaired. We advise to have someone else do the driving if trouble finding directions or if minor accidents are reported. Independent driving assessment is available to determine safety of driving. ? ? ?If you are interested in the driving assessment, you can contact the following: ? ?[employer] in [place] ? ?[place] [phone] ? ?[facility] [phone] ? ?[facility] [phone] or [phone] ? ?  ? ? ?Mediterranean Diet ?A Mediterranean diet refers to food and lifestyle choices that are based on the traditions of countries located on the [employer]. This way of eating has been shown to help prevent certain conditions and improve outcomes for people who have chronic diseases, like kidney disease and heart disease. ?What are tips for following this plan? ?Lifestyle  ?Cook and eat meals together with your family, when possible. ?Drink enough fluid to keep your urine clear or pale yellow. ?Be physically active every day. This includes: ?Aerobic  exercise like running or swimming. ?Leisure activities like gardening, walking, or housework. ?Get 7-8 hours of sleep each night. ?If recommended by your health care provider, drink red wine in moderation. This means 1 glass a day for nonpregnant women and 2 glasses a day for men. A glass of wine equals 5 oz (150 mL). ?Reading food labels  ?Check the serving size of packaged foods. For foods such as rice and pasta, the serving size refers to the amount of cooked product, not dry. ?Check the total fat in packaged foods. Avoid foods that have saturated fat or trans fats. ?Check the ingredients list for added sugars, such as corn syrup. ?Shopping  ?At the grocery store, buy most of your food from the areas near the walls of the store. This includes: ?Fresh fruits and vegetables (produce). ?Grains, beans, nuts, and seeds. Some of these may be available in unpackaged forms or large amounts (in bulk). ?Fresh seafood. ?Poultry and eggs. ?Low-fat dairy products. ?Buy whole ingredients instead of prepackaged foods. ?Buy fresh fruits and vegetables in-season from local farmers markets. ?Buy frozen fruits and vegetables in resealable bags. ?If you do not have access to quality fresh seafood, buy precooked frozen shrimp or canned fish, such as tuna, salmon, or sardines. ?Buy small amounts of raw or cooked vegetables, salads, or olives from the deli or salad bar at your store. ?Stock your pantry so you always have certain foods on hand, such as olive oil, canned tuna, canned tomatoes, rice, pasta, and beans. ?Cooking  ?Cook foods  with extra-virgin olive oil instead of using butter or other vegetable oils. ?Have meat as a side dish, and have vegetables or grains as your main dish. This means having meat in small portions or adding small amounts of meat to foods like pasta or stew. ?Use beans or vegetables instead of meat in common dishes like chili or lasagna. ?Experiment with different cooking methods. Try roasting or broiling  vegetables instead of steaming or saut?eing them. ?Add frozen vegetables to soups, stews, pasta, or rice. ?Add nuts or seeds for added healthy fat at each meal. You can add these to yogurt, salads, or veg

## 2021-10-01 NOTE — Progress Notes (Signed)
? ? ?Assessment/Plan:  ? ?Hunter Boone is a very pleasant 76 y.o. year old RH male with  a history of hypertension, hyperlipidemia, anxiety, depression, aortic atherosclerosis, COPD, tobacco habituation, history of lung nodule, diverticulosis, prediabetes, seen today for evaluation of memory loss. MoCA today is 12/30 with deficiencies in delayed recall 0/5, visuospatial executive 1/5, naming 2/3, memory, calculation.  Patient is on donepezil 10 mg nightly, has been placed on memantine 5 mg twice daily by PCP on 09/30/2021 tolerating well.  Findings are suspicious for Alzheimer's disease.  He was priorly seen at our office, in 2020, with a diagnosis of MCI, then undergoing an neurocognitive testing on 05/02/2020, with strong concerns surrounding Alzheimer's disease.  Currently, he also has difficulties with executive functions and processing speed, suspicious for vascular etiology as well. ? 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 ? ? Recommendations:  ? ?Repeat MRI brain with/without contrast to assess for underlying structural abnormality and assess vascular load  ?Continue donepezil 10 mg daily, and memantine 5 mg twice daily by PCP may need to increase to 10 mg twice daily at some point ?Continue to replenish B12 ?Discussed safety both in and out of the home.  ?Discussed the importance of regular daily schedule with inclusion of crossword puzzles to maintain brain function.  ?Continue to monitor mood with PCP.  ?Stay active at least 30 minutes at least 3 times a week.  ?Naps should be scheduled and should be no longer than 60 minutes and should not occur after 2 PM.  ?Control cardiovascular risk factors  ?Mediterranean diet is recommended  ?Folllow up in 3 months ? ?Subjective:  ? ? ?The patient is seen in neurologic consultation at the request of Hunter Lesch Lezlie Octave, MD for the evaluation of memory.  The patient is accompanied by  who supplements  the history. ?This is a 76 y.o. year old RH  male who has had memory issues for about 5 or 6 years.  Initially seen in October 2020, after neurocognitive testing yielded a diagnosis of mild "negative disorder, etiology possibly Alzheimer's disease, but early in the disease process.  At the time, he was started on donepezil 5 mg daily without side effects.  He was last seen at our office on 09/05/2019, then losing to follow-up.  He then presented to his PCP with memory complaints, which were no suspicious for progression of the disease.  PCP started the patient on memantine 5 mg twice daily on 09/30/2021. ?His wife reports that he repeats himself more frequently, and he is very disoriented, asking her for example "how long will lived in this house?, " Did we move together to his house?, " Where is Hunter Boone (his wife) ", "have you talk to my mother?  (She is deceased) ".  He denies living objects in unusual places, ambulates without difficulty and denies any falls.  While in [place], he had 1 episode of concussion, without requiring neurosurgical procedures.  There is no wandering behavior, he does not drive.  There are no mood changes, and appears to be decreased insight into the situation.  No irritability.  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  There are no hygiene concerns, he is independent of bathing and dressing.  His medications and finances are managed by his wife.  There are no appetite changes, he denies trouble swallowing, and his wife is in  charge of the cooking.  Denies any headaches, double vision, dizziness, focal numbness or tingling, unilateral weakness, tremors or anosmia, or seizure history.  No urine incontinence, retention, constipation or diarrhea reported.  Denies a history of sleep apnea.  Denies alcohol history.  He smokes 1 pack a day of cigarettes a day.  Family history  remarkable for mother and father with Alzheimer's disease, his sister is in memory care for Alzheimer's disease.  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 ? ?  ?History of Present Illness: This is a pleasant 76 year old right-handed man with a history of hypertension, macular degeneration, prior fall with traumatic pneumothorax, recurrent falls in the past, presenting for evaluation of memory loss. He feels his memory is a "7 or 8 out of 10." He started noticing changes several years ago, forgetting dates more than anything else. He lives with his wife Hunter Boone who is present to provide additional information. She started noticing short-term memory changes a few years ago where he would ask the same questions. He however remained pretty good with keeping track of finances and managing his own medications. She denied any driving concerns, he denies getting lost driving. Concern regarding his cognitive status occurred due to significant delirium during his hospitalization last 12/17/2018. He fell on his left side while cleaning out a storage space and sustained a left closed femoral neck fracture. There was no loss of consciousness, he was brought by EMS to the ER on the evening of 7/5. Records were reviewed. It appears he received fentanyl that evening. He was scheduled for surgery the next day but on 7/6 he became confused and refused surgery. Concern was raised for possible alcohol use and risk of withdrawals, he had mentioned having a few drinks at night, his wife denied significant alcohol use. He was oriented only to self and was unable to give consent for surgery, combative, removing his IV. He kept saying he was having a haircut and wanted to leave. His wife and son tried to convince him on the phone but he was adamant and wanted to have another opinion.  He became very agitated in the evening and needed a sitter. He had a head CT with no acute changes, mild diffuse atrophy and chronic microvascular  disease. Bloodwork unremarkable. On 7/7, his wife came to the hospital and tried to convince him, surgery was done that day, it was noted he was calm with no recollection of events from the day prior. He remained confused the day after, thinking he was in [place], wanting to go out and smoke a cigarette, and was given IV Haldol. He was evaluated by Speech Therapy with a MOCA score of 15/30. He was discharged home the day after surgery with home PT. He was calm once he got home. No further confusion since then. His wife reports that he is "the most calm man ever" and works as a Community education officer. No prior similar confusional episodes with surgeries in the past. No paranoia or hallucinations. His wife called our office to report that he had a Cognitive and Communications Exam at [facility] (report unavailable for review). Per wife, results showed RAW 78, Oral Expression 89, Orientation 83, Speech Comprehension 92, Reading Comprehension 100, Writing 100, Attention 88, Problem Solving 96, Memory 37. All our of 100 range.  ?  ?He has a strong family history of dementia in both parents and his sister. He was knocked out one time as a Company secretary in  [place], no neurosurgical procedures. He drinks a couple of ounces a day of bourbon for several years. Mood and sleep are good. He denies any headaches, dizziness, diplopia, dysarthria, dysphagia, neck/back pain, focal numbness/tingling/weakness, bowel/bladder dysfunction. No anosmia, tremors, no further falls. His hip is not bothering him. ?  ? ?No Known Allergies ? ?Current Outpatient Medications  ?Medication Instructions  ? amLODipine (NORVASC) 5 MG tablet TAKE 1 TABLET BY MOUTH  DAILY  ? donepezil (ARICEPT) 10 mg, Oral, Daily at bedtime  ? fluticasone (FLONASE) 50 MCG/ACT nasal spray USE 2 SPRAYS IN BOTH  NOSTRILS DAILY AS NEEDED  FOR ALLERGIES  ? losartan (COZAAR) 100 MG tablet TAKE 1 TABLET BY MOUTH  DAILY  ? memantine (NAMENDA) 5 mg, Oral, 2 times daily  ?  Multiple Vitamins-Minerals (CENTRUM PO) 1 tablet, Oral, Daily,    ? Multiple Vitamins-Minerals (PRESERVISION AREDS 2 PO) 1 capsule, Oral, BH-each morning  ? rosuvastatin (CRESTOR) 5 MG tablet TAKE 1 TABLET BY MOUTH  DA

## 2021-10-07 ENCOUNTER — Ambulatory Visit
Admission: RE | Admit: 2021-10-07 | Discharge: 2021-10-07 | Disposition: A | Discharge status: Home / Self Care | Payer: Medicare Other | Source: Ambulatory Visit | Attending: Physician Assistant | Admitting: Physician Assistant

## 2021-10-07 ENCOUNTER — Telehealth: Payer: Self-pay | Admitting: Physician Assistant

## 2021-10-07 DIAGNOSIS — G936 Cerebral edema: Secondary | ICD-10-CM | POA: Diagnosis not present

## 2021-10-07 DIAGNOSIS — G319 Degenerative disease of nervous system, unspecified: Secondary | ICD-10-CM | POA: Diagnosis not present

## 2021-10-07 DIAGNOSIS — R413 Other amnesia: Secondary | ICD-10-CM | POA: Diagnosis not present

## 2021-10-07 NOTE — Telephone Encounter (Signed)
Patients wife called, since alicia has been put on memantine it seems he has become worse. ?He will say he wants to "call susan" now its worse, esp at night. Says he wants to leave his home and go back home to his family in [place] he is not with his wife. Trying to find her. ? ?Adamant he was driving to [place] for a whole day ?Wants to pack bags and go home to [place].  ?Wants to know if its the medicine making him do this. It hasn't always been this way. ? ?She isnt sure if he has been taking the medicine,aricept in the past.,but she knows he is taking it now ?

## 2021-10-12 ENCOUNTER — Encounter: Payer: Self-pay | Admitting: Internal Medicine

## 2021-11-02 ENCOUNTER — Ambulatory Visit: Payer: Medicare Other | Admitting: Neurology

## 2021-11-26 ENCOUNTER — Telehealth: Payer: Self-pay | Admitting: Internal Medicine

## 2021-11-26 ENCOUNTER — Encounter: Payer: Self-pay | Admitting: Internal Medicine

## 2021-11-26 NOTE — Telephone Encounter (Signed)
Pt wife called in and stated he is having severe diarrhea since 6/10 and has been taking otc meds. She would like to know if he should continue taking otc meds.  Please advise.

## 2021-11-27 ENCOUNTER — Emergency Department (HOSPITAL_BASED_OUTPATIENT_CLINIC_OR_DEPARTMENT_OTHER)
Admission: EM | Admit: 2021-11-27 | Discharge: 2021-11-27 | Disposition: A | Discharge status: Home / Self Care | Payer: Medicare Other | Attending: Emergency Medicine | Admitting: Emergency Medicine

## 2021-11-27 ENCOUNTER — Emergency Department (HOSPITAL_BASED_OUTPATIENT_CLINIC_OR_DEPARTMENT_OTHER): Payer: Medicare Other

## 2021-11-27 ENCOUNTER — Encounter (HOSPITAL_BASED_OUTPATIENT_CLINIC_OR_DEPARTMENT_OTHER): Payer: Self-pay | Admitting: Emergency Medicine

## 2021-11-27 ENCOUNTER — Other Ambulatory Visit: Payer: Self-pay

## 2021-11-27 DIAGNOSIS — R7401 Elevation of levels of liver transaminase levels: Secondary | ICD-10-CM | POA: Diagnosis not present

## 2021-11-27 DIAGNOSIS — R197 Diarrhea, unspecified: Secondary | ICD-10-CM | POA: Diagnosis not present

## 2021-11-27 DIAGNOSIS — F0390 Unspecified dementia without behavioral disturbance: Secondary | ICD-10-CM | POA: Diagnosis not present

## 2021-11-27 DIAGNOSIS — R748 Abnormal levels of other serum enzymes: Secondary | ICD-10-CM

## 2021-11-27 DIAGNOSIS — R109 Unspecified abdominal pain: Secondary | ICD-10-CM | POA: Diagnosis not present

## 2021-11-27 DIAGNOSIS — I700 Atherosclerosis of aorta: Secondary | ICD-10-CM | POA: Diagnosis not present

## 2021-11-27 LAB — URINALYSIS, ROUTINE W REFLEX MICROSCOPIC
Bilirubin Urine: NEGATIVE
Glucose, UA: NEGATIVE mg/dL
Leukocytes,Ua: NEGATIVE
Nitrite: NEGATIVE
Protein, ur: 30 mg/dL — AB
Specific Gravity, Urine: 1.019 (ref 1.005–1.030)
pH: 6 (ref 5.0–8.0)

## 2021-11-27 LAB — COMPREHENSIVE METABOLIC PANEL
ALT: 20 U/L (ref 0–44)
AST: 26 U/L (ref 15–41)
Albumin: 4.6 g/dL (ref 3.5–5.0)
Alkaline Phosphatase: 66 U/L (ref 38–126)
Anion gap: 9 (ref 5–15)
BUN: 7 mg/dL — ABNORMAL LOW (ref 8–23)
CO2: 25 mmol/L (ref 22–32)
Calcium: 10.4 mg/dL — ABNORMAL HIGH (ref 8.9–10.3)
Chloride: 95 mmol/L — ABNORMAL LOW (ref 98–111)
Creatinine, Ser: 1.11 mg/dL (ref 0.61–1.24)
GFR, Estimated: >60 mL/min (ref >60)
Glucose, Bld: 100 mg/dL — ABNORMAL HIGH (ref 70–99)
Potassium: 3.8 mmol/L (ref 3.5–5.1)
Sodium: 129 mmol/L — ABNORMAL LOW (ref 135–145)
Total Bilirubin: 0.9 mg/dL (ref 0.3–1.2)
Total Protein: 8.2 g/dL — ABNORMAL HIGH (ref 6.5–8.1)

## 2021-11-27 LAB — CBC
HCT: 38.3 % — ABNORMAL LOW (ref 39.0–52.0)
Hemoglobin: 12.9 g/dL — ABNORMAL LOW (ref 13.0–17.0)
MCH: 30.6 pg (ref 26.0–34.0)
MCHC: 33.7 g/dL (ref 30.0–36.0)
MCV: 90.8 fL (ref 80.0–100.0)
Platelets: 255 10*3/uL (ref 150–400)
RBC: 4.22 MIL/uL (ref 4.22–5.81)
RDW: 13.2 % (ref 11.5–15.5)
WBC: 7.8 10*3/uL (ref 4.0–10.5)
nRBC: 0 % (ref 0.0–0.2)

## 2021-11-27 LAB — LIPASE, BLOOD: Lipase: 231 U/L — ABNORMAL HIGH (ref 11–51)

## 2021-11-27 MED ORDER — METRONIDAZOLE 500 MG PO TABS: 500.0000 mg | ORAL_TABLET | Freq: Two times a day (BID) | ORAL | 0 refills | Status: DC

## 2021-11-27 MED ORDER — IOHEXOL 300 MG/ML  SOLN
100.0000 mL | Freq: Once | INTRAMUSCULAR | Status: AC | PRN
  Administered 2021-11-27: 80 mL via INTRAVENOUS

## 2021-11-27 MED ORDER — CIPROFLOXACIN HCL 500 MG PO TABS: 500.0000 mg | ORAL_TABLET | Freq: Two times a day (BID) | ORAL | 0 refills | Status: AC

## 2021-11-27 NOTE — ED Notes (Signed)
Took patient multiple times to the bathroom for a stool sample. Pt. Has not had a BM yet.

## 2021-11-27 NOTE — ED Provider Notes (Signed)
  Physical Exam  BP 128/74   Pulse 67   Temp 97.9 F (36.6 C) (Oral)   Resp 17   Ht 6' (1.829 m)   SpO2 99%   BMI 17.09 kg/m   Physical Exam  Procedures  Procedures  ED Course / MDM   Clinical Course as of 11/27/21 1746  Fri Nov 27, 2021  1410 CBC reviewed interpreted and with mild anemia hemoglobin 12.9 has been trending down over several months [DR]  1411 Lipase, blood(!) Lipase reviewed and interpreted significantly elevated at 231 with no old to compare [DR]  1411 Comprehensive metabolic panel(!) Complete metabolic panel reviewed interpreted with sodium 129, otherwise electrolytes are generally within normal limits and no elevated LFTs or bilirubin [DR]  1606 CT ABDOMEN PELVIS W CONTRAST [DR]    Clinical Course User Index [DR] Pattricia Boss, MD   Medical Decision Making Amount and/or Complexity of Data Reviewed Labs: ordered. Decision-making details documented in ED Course. Radiology: ordered. Decision-making details documented in ED Course.  Risk Prescription drug management.   76 year old male with report of large amount of diarrhea.  Lipase on labs elevated.  CT scan ordered to further assess.  GI pathogen panel ordered for patient has not produced diarrhea.  Plan to follow-up on CT scan at time of signout, likely give Rx for Cipro and Flagyl to cover for bacterial source of his diarrhea.  Since being in ER for 6 hours patient has not had bowel movement.  CT scan is negative for acute pathology, radiologist did comment on enlarged prostate.  Gave Rx for some antibiotics.  Instructed to follow-up with primary care.  Patient also seen GI instructed can also try following up with GI as well.  Patient is remarkably well-appearing with normal vital signs, ambulatory in department without difficulty, discharged with significant other.      Lucrezia Starch, MD 11/27/21 1753

## 2021-11-27 NOTE — ED Provider Notes (Signed)
[facility]GENC[facility] DEPT Provider Note   CSN: [id number] Arrival date & time: 11/27/21  1110     History {Add pertinent medical, surgical, social history, OB history to HPI:1} Chief Complaint  Patient presents with   Diarrhea    Hunter Boone is a 76 y.o. male.  HPI 76 year old male who presents today complaining of diarrhea.  His wife is present with him and gives additional history.  She states that began on Saturday.  She describes at least 5 and maybe up to 10 or so episodes a day.  She states he has been getting up at night to go to the bathroom.  He has had some some slight crampy lower abdominal pain.  There is no eating associated with it.  He has not been on any recent antibiotics.  Has not had fever chills, nausea, or vomiting.  He has been taking p.o. without difficulty and has urinating as normal     Home Medications Prior to Admission medications   Medication Sig Start Date End Date Taking? Authorizing Provider  amLODipine (NORVASC) 5 MG tablet TAKE 1 TABLET BY MOUTH  DAILY 07/28/20   Binnie Rail, MD  donepezil (ARICEPT) 10 MG tablet Take 1 tablet (10 mg total) by mouth at bedtime. 09/30/21   Burns, Claudina Lick, MD  fluticasone (FLONASE) 50 MCG/ACT nasal spray USE 2 SPRAYS IN BOTH  NOSTRILS DAILY AS NEEDED  FOR ALLERGIES Patient taking differently: Place 2 sprays into both nostrils as needed. 12/28/19   Binnie Rail, MD  losartan (COZAAR) 100 MG tablet TAKE 1 TABLET BY MOUTH  DAILY 06/04/21   Binnie Rail, MD  memantine (NAMENDA) 5 MG tablet Take 1 tablet (5 mg total) by mouth 2 (two) times daily. 09/30/21   Binnie Rail, MD  Multiple Vitamins-Minerals (CENTRUM PO) Take 1 tablet by mouth daily.    [provider]  Multiple Vitamins-Minerals (PRESERVISION AREDS 2 PO) Take 1 capsule by mouth every morning.     [provider]  rosuvastatin (CRESTOR) 5 MG tablet TAKE 1 TABLET BY MOUTH  DAILY 06/04/21   Binnie Rail, MD  tamsulosin  (FLOMAX) 0.4 MG CAPS capsule Take 1 capsule (0.4 mg total) by mouth daily. 08/29/20   Binnie Rail, MD  vitamin C (ASCORBIC ACID) 500 MG tablet Take 500 mg by mouth daily.    [provider]      Allergies    Patient has no known allergies.    Review of Systems   Review of Systems  Physical Exam Updated Vital Signs BP 121/73   Pulse 61   Temp 97.9 F (36.6 C) (Oral)   Resp 17   Ht 1.829 m (6')   SpO2 99%   BMI 17.09 kg/m  Physical Exam Vitals and nursing note reviewed.  Constitutional:      Appearance: Normal appearance.  HENT:     Head: Normocephalic.     Right Ear: External ear normal.     Left Ear: External ear normal.     Nose: Nose normal.     Mouth/Throat:     Pharynx: Oropharynx is clear.  Eyes:     Pupils: Pupils are equal, round, and reactive to light.  Cardiovascular:     Rate and Rhythm: Normal rate and regular rhythm.     Pulses: Normal pulses.     Heart sounds: Normal heart sounds.  Pulmonary:     Effort: Pulmonary effort is normal.     Breath sounds:  Normal breath sounds.  Abdominal:     General: Abdomen is flat. Bowel sounds are normal. There is no distension.     Palpations: Abdomen is soft. There is no mass.     Tenderness: There is no abdominal tenderness. There is no guarding or rebound.     Hernia: No hernia is present.  Musculoskeletal:        General: Normal range of motion.     Cervical back: Normal range of motion.  Skin:    General: Skin is warm and dry.     Capillary Refill: Capillary refill takes less than 2 seconds.  Neurological:     General: No focal deficit present.     Mental Status: He is alert. Mental status is at baseline.  Psychiatric:        Mood and Affect: Mood normal.     ED Results / Procedures / Treatments   Labs (all labs ordered are listed, but only abnormal results are displayed) Labs Reviewed  CBC - Abnormal; Notable for the following components:      Result Value   Hemoglobin 12.9 (*)    HCT  38.3 (*)    All other components within normal limits  URINALYSIS, ROUTINE W REFLEX MICROSCOPIC - Abnormal; Notable for the following components:   Hgb urine dipstick MODERATE (*)    Ketones, ur TRACE (*)    Protein, ur 30 (*)    All other components within normal limits  C DIFFICILE QUICK SCREEN W PCR REFLEX    LIPASE, BLOOD  COMPREHENSIVE METABOLIC PANEL    EKG None  Radiology No results found.  Procedures Procedures  {Document cardiac monitor, telemetry assessment procedure when appropriate:1}  Medications Ordered in ED Medications - No data to display  ED Course/ Medical Decision Making/ A&P Clinical Course as of 11/27/21 1608  Fri Nov 27, 2021  1410 CBC reviewed interpreted and with mild anemia hemoglobin 12.9 has been trending down over several months [DR]  1411 Lipase, blood(!) Lipase reviewed and interpreted significantly elevated at 231 with no old to compare [DR]  1411 Comprehensive metabolic panel(!) Complete metabolic panel reviewed interpreted with sodium 129, otherwise electrolytes are generally within normal limits and no elevated LFTs or bilirubin [DR]  1606 CT ABDOMEN PELVIS W CONTRAST [DR]    Clinical Course User Index [DR] Pattricia Boss, MD                           Medical Decision Making 76 year old man with some dementia presents today with wife with complaints of diarrhea over the past week.  He is unable to quantify this.  Wife states that he has been up and not sleeping at night due to this.  No blood, fever, nausea, or vomiting reported.  Has work-up here with CBC that appears to be generally normal with a very mild anemia with hemoglobin 12.9 which has trended down. Sodium is 129 otherwise electrolytes and complete metabolic panel are normal He does have a significantly elevated lipase.  Secondary to the elevated lipase will obtain CT of abdomen pelvis Discussed care with Dr. Roslynn Amble who will disposition patient after return of CT  results. Discussed with patient and wife and will start Cipro Flagyl and they understand they need to call their primary care physician for follow-up of the labs obtained here.  Amount and/or Complexity of Data Reviewed Labs: ordered. Decision-making details documented in ED Course. Radiology: ordered. Decision-making details documented in ED Course.  Risk Prescription drug management.    {Document critical care time when appropriate:1} {Document review of labs and clinical decision tools ie heart score, Chads2Vasc2 etc:1}  {Document your independent review of radiology images, and any outside records:1} {Document your discussion with family members, caretakers, and with consultants:1} {Document social determinants of health affecting pt's care:1} {Document your decision making why or why not admission, treatments were needed:1} Final Clinical Impression(s) / ED Diagnoses Final diagnoses:  None    Rx / DC Orders ED Discharge Orders     None

## 2021-11-27 NOTE — Discharge Instructions (Addendum)
Please call Dr. Eilleen Kempf office on Monday for recheck Drink plenty of fluids especially Gatorade mixed half-and-half with water oral repletion Your urine had some trace blood in them and will need to be rechecked by your primary doctor. Also discuss your enlarged prostate with your primary doctor.   Dr. Jeanell Sparrow recommended trying some antibiotics.  You may also try calling Dr. Silvio Pate office for follow up.

## 2021-11-27 NOTE — Telephone Encounter (Signed)
Spoke with spouse today and she will take patient to [place] location.

## 2021-11-27 NOTE — ED Triage Notes (Signed)
"  Uncontrollable diarrhea since Saturday night." Very liquidy. Immodium not effective. Denies abdominal pain, denies vomiting nausea. Appetite is normal

## 2021-12-02 ENCOUNTER — Ambulatory Visit: Payer: Medicare Other | Admitting: Internal Medicine

## 2021-12-03 NOTE — Progress Notes (Unsigned)
    Subjective:    Patient ID: Hunter Boone, male    DOB: [birth year], 76 y.o.   MRN: [id number]      HPI Hunter Boone is here for  Chief Complaint  Patient presents with   Diarrhea     It started 6/10 and has been persistent.  6/16-went to ED.  He was having 5-10 episodes of diarrhea per day.  He was getting up in the melanite to go to the bathroom.  He had some slight cramping in the lower abdomen.  No fevers, nausea or vomiting.  Diarrhea is not associated with eating.  No recent antibiotics.  Work-up in the ED revealed hyponatremia with a sodium of 129, but normal kidney function and liver tests.  Hemoglobin 12.9.  WBC normal.  UA had blood, but no infection.  Lipase was elevated at 231 (11-51).  CT of the abdomen and pelvis showed no acute abnormality.  Prostate was enlarged with diffuse chronic bladder wall thickening.  Vital signs were stable.  He was prescribed Cipro and Flagyl to cover for possible bacterial causes of the diarrhea.  He was not able to produce a stool sample while in emergency room.      Medications and allergies reviewed with patient and updated if appropriate.  Current Outpatient Medications on File Prior to Visit  Medication Sig Dispense Refill   amLODipine (NORVASC) 5 MG tablet TAKE 1 TABLET BY MOUTH  DAILY 90 tablet 3   ciprofloxacin (CIPRO) 500 MG tablet Take 1 tablet (500 mg total) by mouth every 12 (twelve) hours for 7 days. 14 tablet 0   donepezil (ARICEPT) 10 MG tablet Take 1 tablet (10 mg total) by mouth at bedtime. 90 tablet 3   fluticasone (FLONASE) 50 MCG/ACT nasal spray USE 2 SPRAYS IN BOTH  NOSTRILS DAILY AS NEEDED  FOR ALLERGIES (Patient taking differently: Place 2 sprays into both nostrils as needed.) 48 g 1   losartan (COZAAR) 100 MG tablet TAKE 1 TABLET BY MOUTH  DAILY 90 tablet 3   memantine (NAMENDA) 5 MG tablet Take 1 tablet (5 mg total) by mouth 2 (two) times daily. 60 tablet 5   metroNIDAZOLE (FLAGYL) 500 MG tablet Take 1 tablet (500 mg  total) by mouth 2 (two) times daily. 14 tablet 0   Multiple Vitamins-Minerals (CENTRUM PO) Take 1 tablet by mouth daily.     Multiple Vitamins-Minerals (PRESERVISION AREDS 2 PO) Take 1 capsule by mouth every morning.      rosuvastatin (CRESTOR) 5 MG tablet TAKE 1 TABLET BY MOUTH  DAILY 90 tablet 3   tamsulosin (FLOMAX) 0.4 MG CAPS capsule Take 1 capsule (0.4 mg total) by mouth daily. 90 capsule 3   vitamin C (ASCORBIC ACID) 500 MG tablet Take 500 mg by mouth daily.     No current facility-administered medications on file prior to visit.    Review of Systems     Objective:  There were no vitals filed for this visit. BP Readings from Last 3 Encounters:  11/27/21 128/69  10/01/21 (!) 148/84  09/30/21 118/80   Wt Readings from Last 3 Encounters:  10/01/21 126 lb (57.2 kg)  09/30/21 125 lb (56.7 kg)  09/03/21 126 lb (57.2 kg)   There is no height or weight on file to calculate BMI.    Physical Exam         Assessment & Plan:    See Problem List for Assessment and Plan of chronic medical problems.

## 2021-12-04 ENCOUNTER — Encounter: Payer: Self-pay | Admitting: Internal Medicine

## 2021-12-04 ENCOUNTER — Ambulatory Visit (INDEPENDENT_AMBULATORY_CARE_PROVIDER_SITE_OTHER): Payer: Medicare Other | Admitting: Internal Medicine

## 2021-12-04 VITALS — BP 98/66 | HR 68 | Temp 97.6°F | Ht 72.0 in | Wt 121.4 lb

## 2021-12-04 DIAGNOSIS — I10 Essential (primary) hypertension: Secondary | ICD-10-CM

## 2021-12-04 DIAGNOSIS — R197 Diarrhea, unspecified: Secondary | ICD-10-CM | POA: Diagnosis not present

## 2021-12-04 LAB — COMPREHENSIVE METABOLIC PANEL
ALT: 22 U/L (ref 0–53)
AST: 29 U/L (ref 0–37)
Albumin: 4.2 g/dL (ref 3.5–5.2)
Alkaline Phosphatase: 64 U/L (ref 39–117)
BUN: 12 mg/dL (ref 6–23)
CO2: 26 mEq/L (ref 19–32)
Calcium: 9.8 mg/dL (ref 8.4–10.5)
Chloride: 96 mEq/L (ref 96–112)
Creatinine, Ser: 1.09 mg/dL (ref 0.40–1.50)
GFR: 66 mL/min (ref >60.00)
Glucose, Bld: 96 mg/dL (ref 70–99)
Potassium: 4.8 mEq/L (ref 3.5–5.1)
Sodium: 131 mEq/L — ABNORMAL LOW (ref 135–145)
Total Bilirubin: 0.6 mg/dL (ref 0.2–1.2)
Total Protein: 7.3 g/dL (ref 6.0–8.3)

## 2021-12-04 LAB — LIPASE: Lipase: 200 U/L — ABNORMAL HIGH (ref 11.0–59.0)

## 2021-12-04 NOTE — Assessment & Plan Note (Signed)
Chronic Given his weight loss and recent diarrhea his BP is low He is asymptomatic, but is not a good historian secondary to his dementia Stop amlodipine Continue losartan 100 mg daily for now, but we may need to decrease this dose in the near future

## 2021-12-31 ENCOUNTER — Ambulatory Visit: Payer: Medicare Other | Admitting: Physician Assistant

## 2021-12-31 ENCOUNTER — Encounter: Payer: Self-pay | Admitting: Physician Assistant

## 2021-12-31 VITALS — BP 151/75 | HR 72 | Resp 20 | Ht 72.0 in | Wt 119.0 lb

## 2021-12-31 DIAGNOSIS — F01B0 Vascular dementia, moderate, without behavioral disturbance, psychotic disturbance, mood disturbance, and anxiety: Secondary | ICD-10-CM

## 2021-12-31 MED ORDER — RIVASTIGMINE TARTRATE 1.5 MG PO CAPS: ORAL_CAPSULE | ORAL | 11 refills | Status: DC

## 2021-12-31 NOTE — Patient Instructions (Signed)
It was a pleasure to see you today at our office.   Recommendations:  Follow up in  6 months Start rivastigmine 1.5 mg, 1 cap at night for 1-2 weeks then increase to 1 cap 2 times a day   Whom to call:  Memory  decline, memory medications: Call our office [phone]   For psychiatric meds, mood meds: Please have your primary care physician manage these medications.   Counseling regarding caregiver distress, including caregiver depression, anxiety and issues regarding community resources, adult day care programs, adult living facilities, or memory care questions:   Feel free to contact Rogers, Social Worker at [phone]   For assessment of decision of mental capacity and competency:  Call Dr. Anthoney Harada, geriatric psychiatrist at [phone]  For guidance in geriatric dementia issues please call [facility] [phone]  For guidance regarding [facility] and if placement were needed at the facility, contact Arnell Asal, Social Worker tel: [phone]  If you have any severe symptoms of a stroke, or other severe issues such as confusion,severe chills or fever, etc call 911 or go to the ER as you may need to be evaluated further        RECOMMENDATIONS FOR ALL PATIENTS WITH MEMORY PROBLEMS: 1. Continue to exercise (Recommend 30 minutes of walking everyday, or 3 hours every week) 2. Increase social interactions - continue going to [place] and enjoy social gatherings with friends and family 3. Eat healthy, avoid fried foods and eat more fruits and vegetables 4. Maintain adequate blood pressure, blood sugar, and blood cholesterol level. Reducing the risk of stroke and cardiovascular disease also helps promoting better memory. 5. Avoid stressful situations. Live a simple life and avoid aggravations. Organize your time and prepare for the next day in anticipation. 6. Sleep well, avoid any interruptions of sleep and avoid any  distractions in the bedroom that may interfere with adequate sleep quality 7. Avoid sugar, avoid sweets as there is a strong link between excessive sugar intake, diabetes, and cognitive impairment We discussed the Mediterranean diet, which has been shown to help patients reduce the risk of progressive memory disorders and reduces cardiovascular risk. This includes eating fish, eat fruits and green leafy vegetables, nuts like almonds and hazelnuts, walnuts, and also use olive oil. Avoid fast foods and fried foods as much as possible. Avoid sweets and sugar as sugar use has been linked to worsening of memory function.  There is always a concern of gradual progression of memory problems. If this is the case, then we may need to adjust level of care according to patient needs. Support, both to the patient and caregiver, should then be put into place.    The Alzheimer's Association is here all day, every day for people facing Alzheimer's disease through our free 24/7 Helpline: [phone]. The Helpline provides reliable information and support to all those who need assistance, such as individuals living with memory loss, Alzheimer's or other dementia, caregivers, health care professionals and the public.  Our highly trained and knowledgeable staff can help you with: Understanding memory loss, dementia and Alzheimer's  Medications and other treatment options  General information about aging and brain health  Skills to provide quality care and to find the best care from professionals  Legal, financial and living-arrangement decisions Our Helpline also features: Confidential care consultation provided by master's level clinicians who can help with decision-making support, crisis assistance and education on issues families face every day  Help in a caller's  preferred language using our translation service that features more than 200 languages and dialects  Referrals to local community programs, services and  ongoing support     FALL PRECAUTIONS: Be cautious when walking. Scan the area for obstacles that may increase the risk of trips and falls. When getting up in the mornings, sit up at the edge of the bed for a few minutes before getting out of bed. Consider elevating the bed at the head end to avoid drop of blood pressure when getting up. Walk always in a well-lit room (use night lights in the walls). Avoid area rugs or power cords from appliances in the middle of the walkways. Use a walker or a cane if necessary and consider physical therapy for balance exercise. Get your eyesight checked regularly.  FINANCIAL OVERSIGHT: Supervision, especially oversight when making financial decisions or transactions is also recommended.  HOME SAFETY: Consider the safety of the kitchen when operating appliances like stoves, microwave oven, and blender. Consider having supervision and share cooking responsibilities until no longer able to participate in those. Accidents with firearms and other hazards in the house should be identified and addressed as well.   ABILITY TO BE LEFT ALONE: If patient is unable to contact 911 operator, consider using LifeLine, or when the need is there, arrange for someone to stay with patients. Smoking is a fire hazard, consider supervision or cessation. Risk of wandering should be assessed by caregiver and if detected at any point, supervision and safe proof recommendations should be instituted.  MEDICATION SUPERVISION: Inability to self-administer medication needs to be constantly addressed. Implement a mechanism to ensure safe administration of the medications.   DRIVING: Regarding driving, in patients with progressive memory problems, driving will be impaired. We advise to have someone else do the driving if trouble finding directions or if minor accidents are reported. Independent driving assessment is available to determine safety of driving.   If you are interested in the  driving assessment, you can contact the following:  [employer] in [place]  [place] [phone] or [phone]      [place] refers to food and lifestyle choices that are based on the traditions of countries located on the [employer]. This way of eating has been shown to help prevent certain conditions and improve outcomes for people who have chronic diseases, like kidney disease and heart disease. What are tips for following this plan? Lifestyle  Cook and eat meals together with your family, when possible. Drink enough fluid to keep your urine clear or pale yellow. Be physically active every day. This includes: Aerobic exercise like running or swimming. Leisure activities like gardening, walking, or housework. Get 7-8 hours of sleep each night. If recommended by your health care provider, drink red wine in moderation. This means 1 glass a day for nonpregnant women and 2 glasses a day for men. A glass of wine equals 5 oz (150 mL). Reading food labels  Check the serving size of packaged foods. For foods such as rice and pasta, the serving size refers to the amount of cooked product, not dry. Check the total fat in packaged foods. Avoid foods that have saturated fat or trans fats. Check the ingredients list for added sugars, such as corn syrup. Shopping  At the grocery store, buy most of your food from the areas near the walls of the store. This includes: Fresh fruits and vegetables (  produce). Grains, beans, nuts, and seeds. Some of these may be available in unpackaged forms or large amounts (in bulk). Fresh seafood. Poultry and eggs. Low-fat dairy products. Buy whole ingredients instead of prepackaged foods. Buy fresh fruits and vegetables in-season from local farmers markets. Buy frozen fruits and vegetables in resealable  bags. If you do not have access to quality fresh seafood, buy precooked frozen shrimp or canned fish, such as tuna, salmon, or sardines. Buy small amounts of raw or cooked vegetables, salads, or olives from the deli or salad bar at your store. Stock your pantry so you always have certain foods on hand, such as olive oil, canned tuna, canned tomatoes, rice, pasta, and beans. Cooking  Cook foods with extra-virgin olive oil instead of using butter or other vegetable oils. Have meat as a side dish, and have vegetables or grains as your main dish. This means having meat in small portions or adding small amounts of meat to foods like pasta or stew. Use beans or vegetables instead of meat in common dishes like chili or lasagna. Experiment with different cooking methods. Try roasting or broiling vegetables instead of steaming or sauteing them. Add frozen vegetables to soups, stews, pasta, or rice. Add nuts or seeds for added healthy fat at each meal. You can add these to yogurt, salads, or vegetable dishes. Marinate fish or vegetables using olive oil, lemon juice, garlic, and fresh herbs. Meal planning  Plan to eat 1 vegetarian meal one day each week. Try to work up to 2 vegetarian meals, if possible. Eat seafood 2 or more times a week. Have healthy snacks readily available, such as: Vegetable sticks with hummus. Greek yogurt. Fruit and nut trail mix. Eat balanced meals throughout the week. This includes: Fruit: 2-3 servings a day Vegetables: 4-5 servings a day Low-fat dairy: 2 servings a day Fish, poultry, or lean meat: 1 serving a day Beans and legumes: 2 or more servings a week Nuts and seeds: 1-2 servings a day Whole grains: 6-8 servings a day Extra-virgin olive oil: 3-4 servings a day Limit red meat and sweets to only a few servings a month What are my food choices? Mediterranean diet Recommended Grains: Whole-grain pasta. [place] rice. Bulgar wheat. Polenta. Couscous. Whole-wheat bread.  [place]. Vegetables: Artichokes. Beets. Broccoli. Cabbage. Carrots. Eggplant. Green beans. Chard. Kale. Spinach. Onions. Leeks. Peas. Squash. Tomatoes. Peppers. Radishes. Fruits: Apples. Apricots. Avocado. Berries. Bananas. Cherries. Dates. Figs. Grapes. Lemons. Melon. Oranges. Peaches. Plums. Pomegranate. Meats and other protein foods: Beans. Almonds. Sunflower seeds. Pine nuts. Peanuts. [place]. Salmon. Scallops. Shrimp. [place]. Tilapia. Clams. Oysters. Eggs. Dairy: Low-fat milk. Cheese. Greek yogurt. Beverages: Water. Red wine. Herbal tea. Fats and oils: Extra virgin olive oil. Avocado oil. Grape seed oil. Sweets and desserts: [place] yogurt with honey. Baked apples. Poached pears. Trail mix. Seasoning and other foods: Basil. Cilantro. Coriander. Cumin. Mint. Parsley. Sage. Rosemary. Tarragon. Garlic. Oregano. Thyme. Pepper. Balsalmic vinegar. Tahini. Hummus. Tomato sauce. Olives. Mushrooms. Limit these Grains: Prepackaged pasta or rice dishes. Prepackaged cereal with added sugar. Vegetables: Deep fried potatoes (french fries). Fruits: Fruit canned in syrup. Meats and other protein foods: Beef. Pork. Lamb. Poultry with skin. Hot dogs. [place] Salines. Dairy: Ice cream. Sour cream. Whole milk. Beverages: Juice. Sugar-sweetened soft drinks. Beer. Liquor and spirits. Fats and oils: Butter. Canola oil. Vegetable oil. Beef fat (tallow). Lard. Sweets and desserts: Cookies. Cakes. Pies. Candy. Seasoning and other foods: Mayonnaise. Premade sauces and marinades. The items listed may not be a complete list. Talk with your  dietitian about what dietary choices are right for you. Summary The Mediterranean diet includes both food and lifestyle choices. Eat a variety of fresh fruits and vegetables, beans, nuts, seeds, and whole grains. Limit the amount of red meat and sweets that you eat. Talk with your health care provider about whether it is safe for you to drink red wine in moderation. This means 1 glass a day  for nonpregnant women and 2 glasses a day for men. A glass of wine equals 5 oz (150 mL). This information is not intended to replace advice given to you by your health care provider. Make sure you discuss any questions you have with your health care provider. Document Released: 01/22/2016 Document Revised: 02/24/2016 Document Reviewed: 01/22/2016 Elsevier Interactive Patient Education  2017 [employer].

## 2021-12-31 NOTE — Progress Notes (Signed)
Assessment/Plan:   Mixed vascular and Alzheimer's dementia   Hunter Boone is a very pleasant 76 y.o. RH male  with  a history of hypertension, hyperlipidemia, anxiety, depression, aortic atherosclerosis, COPD, tobacco habituation, history of lung nodule, diverticulosis, prediabetes, macular degeneration and a history of moderate dementia likely mixed vascular and Alzheimer's disease seen today in follow up for memory loss.  Most recent MRI of the brain reviewed by me on 10/07/2021 shows generalized cortical atrophy, progressed since 2020, and mild to moderate periventricular chronic small vessel ischemia for age.  Patient had tried memantine 5 mg twice daily by PCP, but was unable to tolerate it. Later he was on donepezil 5 mg daily and had to be discontinued due to severe diarrhea. He has never tried rivastigmine to date   Recommendations:    Start rivastigmine  slowly due to prior side effects with other antidementia agents, will start at 1.5 mg once at night for 1-2 weeks then increase to 1 cap 2 times a day if tolerated. Side effects dicussed  Continue to monitor mood as per PCP Please set a phone call with [place] Boone, Social Worker, to discuss dementia counseling, including caregiver distress, community resources, etc Consider psychotherapy for PTSD ([place] Boone) Agree with sitter, information about adult day programs was provided. Follow up in 6  months.   Case discussed with Dr. Delice Boone who agrees with the plan     Subjective:    This patient is accompanied in the office by his wife who supplements the history.  Previous records as well as any outside records available were reviewed prior to todays visit. Patient was last seen at our office on 10/01/2021 at which time his MoCA was 12/30   Any changes in memory since last visit?  "His memory is the same"-wife says.  Both STM and LTM are affected.  Sometimes he does not recognize his wife.  He forgets recent  conversations.  He continues to read and likes to do crossword puzzles. Patient lives with: Wife repeats oneself?  "Endorsed, sometimes he forgets things and conversations" Disoriented when walking into a room?  Endorsed.  Sometimes he does not know where he is, does not recognize his house asking "when did we move here ", and sometimes he does not recognize his wife. Leaving objects in unusual places?  Patient denies   Ambulates  with difficulty?   Patient denies   Recent falls?  Patient denies   Any head injuries?  Not recently.  While in [place], he had 1 episode of concussion. History of seizures?   Patient denies   Wandering behavior?  At night he walks inside of the house, he never wanders outside of the house.  However, his wife is entertaining getting home health or sitter while she is out of the house running errands or traveling, so that he is safe. Patient drives?   Patient no longer drives  Any mood changes such irritability agitation?  There is decreased insight, but no recent agitation. Any history of depression?:  Patient denies   Hallucinations?  Patient denies   Paranoia?  Patient denies   Patient reports that he sleeps well with vivid dreams about his experiences in [place], [place] behavior or sleepwalking   History of sleep apnea?  Patient denies   Any hygiene concerns?  Patient denies   Independent of bathing and dressing?  Endorsed  Does the patient needs help with medications?  Wife is in charge as he was missing several  doses Who is in charge of the finances?  Wife is in charge    Any changes in appetite?  Patient denies   Patient have trouble swallowing? Patient denies   Does the patient cook?  Patient denies   Any kitchen accidents such as leaving the stove on? Patient denies   Any headaches?  Patient denies   Double vision? Patient denies   Any focal numbness or tingling?  Patient denies   Chronic back pain Patient denies   Unilateral weakness?  Patient denies    Any tremors?  Patient denies   Any history of anosmia?  Patient denies   Any incontinence of urine?  Patient denies   Any bowel dysfunction?   In June 2023 he had an episode of  gastroenteritis likely due to donepezil, required in ED presentation, he was treated with Cipro and Flagyl anyway, and symptoms are now resolved. He continues to smoke 1 pack a day of cigarettes He drinks seldom  2 bourbons at night     History of Present Illness: This is a pleasant 76 year old right-handed man with a history of hypertension, macular degeneration, prior fall with traumatic pneumothorax, recurrent falls in the past, presenting for evaluation of memory loss. He feels his memory is a "7 or 8 out of 10." He started noticing changes several years ago, forgetting dates more than anything else. He lives with his wife [place] Boone who is present to provide additional information. She started noticing short-term memory changes a few years ago where he would ask the same questions. He however remained pretty good with keeping track of finances and managing his own medications. She denied any driving concerns, he denies getting lost driving. Concern regarding his cognitive status occurred due to significant delirium during his hospitalization last 12/17/2018. He fell on his left side while cleaning out a storage space and sustained a left closed femoral neck fracture. There was no loss of consciousness, he was brought by EMS to the ER on the evening of 7/5. Records were reviewed. It appears he received fentanyl that evening. He was scheduled for surgery the next day but on 7/6 he became confused and refused surgery. Concern was raised for possible alcohol use and risk of withdrawals, he had mentioned having a few drinks at night, his wife denied significant alcohol use. He was oriented only to self and was unable to give consent for surgery, combative, removing his IV. He kept saying he was having a haircut and wanted to leave. His  wife and son tried to convince him on the phone but he was adamant and wanted to have another opinion.  He became very agitated in the evening and needed a sitter. He had a head CT with no acute changes, mild diffuse atrophy and chronic microvascular disease. Bloodwork unremarkable. On 7/7, his wife came to the hospital and tried to convince him, surgery was done that day, it was noted he was calm with no recollection of events from the day prior. He remained confused the day after, thinking he was in [place], [place], wanting to go out and smoke a cigarette, and was given IV Haldol. He was evaluated by Speech Therapy with a MOCA score of 15/30. He was discharged home the day after surgery with home PT. He was calm once he got home. No further confusion since then. His wife reports that he is "the most calm man ever" and works as a Community education officer. No prior similar confusional episodes with surgeries in the  past. No paranoia or hallucinations. His wife called our office to report that he had a Cognitive and Communications Exam at [facility] (report unavailable for review). Per wife, results showed RAW 78, Oral Expression 89, Orientation 83, Speech Comprehension 92, Reading Comprehension 100, Writing 100, Attention 88, Problem Solving 96, Memory 37. All our of 100 range.    He has a strong family history of dementia in both parents and his sister. 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 He drinks a couple of ounces a day of bourbon for several years. Mood and sleep are good. He denies any headaches, dizziness, diplopia, dysarthria, dysphagia, neck/back pain, focal numbness/tingling/weakness, bowel/bladder dysfunction. No anosmia, tremors, no further falls. His hip is not bothering him.    PREVIOUS MEDICATIONS:   CURRENT MEDICATIONS:  Outpatient Encounter Medications as of 12/31/2021  Medication Sig   fluticasone (FLONASE) 50 MCG/ACT nasal spray USE 2 SPRAYS IN  BOTH  NOSTRILS DAILY AS NEEDED  FOR ALLERGIES (Patient taking differently: Place 2 sprays into both nostrils as needed.)   losartan (COZAAR) 100 MG tablet TAKE 1 TABLET BY MOUTH  DAILY   Multiple Vitamins-Minerals (CENTRUM PO) Take 1 tablet by mouth daily.   Multiple Vitamins-Minerals (PRESERVISION AREDS 2 PO) Take 1 capsule by mouth every morning.    rivastigmine (EXELON) 1.5 MG capsule Take 1 capsule at night for  1 week and then one capsule 2 times a day   rosuvastatin (CRESTOR) 5 MG tablet TAKE 1 TABLET BY MOUTH  DAILY   tamsulosin (FLOMAX) 0.4 MG CAPS capsule Take 1 capsule (0.4 mg total) by mouth daily.   vitamin C (ASCORBIC ACID) 500 MG tablet Take 500 mg by mouth daily.   metroNIDAZOLE (FLAGYL) 500 MG tablet Take 1 tablet (500 mg total) by mouth 2 (two) times daily. (Patient not taking: Reported on 12/31/2021)   [DISCONTINUED] donepezil (ARICEPT) 10 MG tablet Take 1 tablet (10 mg total) by mouth at bedtime. (Patient not taking: Reported on 12/31/2021)   No facility-administered encounter medications on file as of 12/31/2021.        No data to display            10/01/2021   10:00 AM 01/23/2019    8:00 AM  Montreal Cognitive Assessment   Visuospatial/ Executive (0/5) 1   Naming (0/3) 2   Attention: Read list of digits (0/2) 2 2  Attention: Read list of letters (0/1) 1 1  Attention: Serial 7 subtraction starting at 100 (0/3) 1 3  Language: Repeat phrase (0/2) 2 2  Language : Fluency (0/1) 1 0  Abstraction (0/2) 1 1  Delayed Recall (0/5) 0 5  Orientation (0/6) 1 6  Total 12   Adjusted Score (based on education) 12     Objective:     PHYSICAL EXAMINATION:    VITALS:   Vitals:   12/31/21 0934  BP: (!) 151/75  Pulse: 72  Resp: 20  SpO2: 98%  Weight: 119 lb (54 kg)  Height: 6' (1.829 m)    GEN:  The patient appears stated age and is in NAD. HEENT:  Normocephalic, atraumatic.   Neurological examination:  General: NAD, well-groomed, appears stated  age. Orientation: The patient is alert. Oriented to person, place and date Cranial nerves: There is good facial symmetry.The speech is fluent and clear. No aphasia or dysarthria. Fund of knowledge is reduced. Recent and remote memory are impaired. Attention and concentration are reduced.  Able to name  objects and repeat phrases.  Hearing is intact to conversational tone.    Sensation: Sensation is intact to light touch throughout Motor: Strength is at least antigravity x4. Tremors: none  DTR's 2/4 in UE/LE     Movement examination: Tone: There is normal tone in the UE/LE Abnormal movements:  no tremor.  No myoclonus.  No asterixis.   Coordination:  There is no decremation with RAM's. Normal finger to nose  Gait and Station: The patient has no difficulty arising out of a deep-seated chair without the use of the hands. The patient's stride length is good.  Gait is cautious and narrow.    Thank you for allowing [place] the opportunity to participate in the care of this nice patient. Please do not hesitate to contact us for any questions or concerns.   Total time spent on today's visit was 36 minutes dedicated to this patient today, preparing to see patient, examining the patient, ordering tests and/or medications and counseling the patient, documenting clinical information in the EHR or other health record, independently interpreting results and communicating results to the patient/family, discussing treatment and goals, answering patient's questions and coordinating care.  Cc:  Binnie Rail, MD  Sharene Butters 12/31/2021 10:04 AM

## 2022-01-12 DIAGNOSIS — R197 Diarrhea, unspecified: Secondary | ICD-10-CM | POA: Diagnosis not present

## 2022-01-12 DIAGNOSIS — A09 Infectious gastroenteritis and colitis, unspecified: Secondary | ICD-10-CM | POA: Diagnosis not present

## 2022-01-14 ENCOUNTER — Telehealth: Payer: Self-pay | Admitting: Internal Medicine

## 2022-01-14 ENCOUNTER — Other Ambulatory Visit: Payer: Self-pay | Admitting: Internal Medicine

## 2022-01-14 LAB — GI PROFILE, STOOL, PCR

## 2022-01-14 MED ORDER — FIDAXOMICIN 200 MG PO TABS: 200.0000 mg | ORAL_TABLET | Freq: Two times a day (BID) | ORAL | 0 refills | Status: DC

## 2022-01-14 NOTE — Telephone Encounter (Signed)
Patient called about the antibiotic that was sent in - co pay was $1000.  Patient's wife called and is very upset that she found about the antibiotic through the pharmacy.  They have not picked up this medicine and will not until someone calls her about the lab results.

## 2022-01-15 MED ORDER — VANCOMYCIN HCL 125 MG PO CAPS: 125.0000 mg | ORAL_CAPSULE | Freq: Four times a day (QID) | ORAL | 0 refills | Status: AC

## 2022-01-15 NOTE — Telephone Encounter (Signed)
Call - his test for cdiff was positive so we need to treat it.    We can not tell how much the medication is before we send it - if it is too expensive then yes we will get an alternative.  Sent vanco - If not covered let me know     Needs follow up after completing abx to make sure this has resolved.

## 2022-01-15 NOTE — Telephone Encounter (Signed)
Spoke with pt and was able to inform his wife. We were able to get 02/03/2022 @ 1:40 pm.

## 2022-01-23 ENCOUNTER — Encounter: Payer: Self-pay | Admitting: Internal Medicine

## 2022-01-25 ENCOUNTER — Telehealth: Payer: Self-pay | Admitting: Physician Assistant

## 2022-01-25 NOTE — Telephone Encounter (Signed)
Called wife she was frustrated and feels she is getting the run around for this prescription

## 2022-01-25 NOTE — Telephone Encounter (Signed)
Patients wife called, she just had an appt with Misty and Misty suggested seroquil.  [facility] is out of town until next week, she wanted to know if the Dr here could prescribe it?

## 2022-01-31 ENCOUNTER — Encounter: Payer: Self-pay | Admitting: Internal Medicine

## 2022-02-02 ENCOUNTER — Encounter: Payer: Self-pay | Admitting: Internal Medicine

## 2022-02-02 NOTE — Progress Notes (Unsigned)
Subjective:    Patient ID: Hunter Boone, male    DOB: 1946/03/09, 76 y.o.   MRN: [id number]     HPI Hunter Boone is here for follow up of his chronic medical problems, including cdiff diarrhea, dementia  Diarrhea - confirmed cdiff -   I prescribed vanco 125 mg QID x 10 days.  He did take around Flagyl prior.  Dementia - mixed vascular and alzheimers - has some agitation.    Still has some diarrhea.  Because of his dementia he is not able to clearly define how often this is happening-he did have a bad episode on Sunday his wife knows, but not sure if he has had any others-he is not able to clearly state if he has or has not.  Medications and allergies reviewed with patient and updated if appropriate.  Current Outpatient Medications on File Prior to Visit  Medication Sig Dispense Refill   fluticasone (FLONASE) 50 MCG/ACT nasal spray USE 2 SPRAYS IN BOTH  NOSTRILS DAILY AS NEEDED  FOR ALLERGIES (Patient taking differently: Place 2 sprays into both nostrils as needed.) 48 g 1   losartan (COZAAR) 100 MG tablet TAKE 1 TABLET BY MOUTH  DAILY 90 tablet 3   Multiple Vitamins-Minerals (CENTRUM PO) Take 1 tablet by mouth daily.     Multiple Vitamins-Minerals (PRESERVISION AREDS 2 PO) Take 1 capsule by mouth every morning.      rivastigmine (EXELON) 1.5 MG capsule Take 1 capsule at night for  1 week and then one capsule 2 times a day 60 capsule 11   rosuvastatin (CRESTOR) 5 MG tablet TAKE 1 TABLET BY MOUTH  DAILY 90 tablet 3   tamsulosin (FLOMAX) 0.4 MG CAPS capsule Take 1 capsule (0.4 mg total) by mouth daily. 90 capsule 3   vitamin C (ASCORBIC ACID) 500 MG tablet Take 500 mg by mouth daily.     No current facility-administered medications on file prior to visit.    He does have dementia and ROS may not accurate Review of Systems  Constitutional:  Negative for fever.  Respiratory:  Negative for cough, shortness of breath and wheezing.   Cardiovascular:  Negative for chest pain,  palpitations and leg swelling.  Gastrointestinal:  Positive for diarrhea. Negative for abdominal pain, blood in stool and nausea.  Neurological:  Negative for light-headedness and headaches.  Psychiatric/Behavioral:  Positive for agitation (Per family-he denies). Negative for dysphoric mood and sleep disturbance. The patient is not nervous/anxious.        Objective:   Vitals:   02/03/22 1343  BP: 124/60  Pulse: 87  Temp: 98.1 F (36.7 C)  SpO2: 96%   BP Readings from Last 3 Encounters:  02/03/22 124/60  12/31/21 (!) 151/75  12/04/21 98/66   Wt Readings from Last 3 Encounters:  02/03/22 119 lb (54 kg)  12/31/21 119 lb (54 kg)  12/04/21 121 lb 6 oz (55.1 kg)   Body mass index is 16.14 kg/m.    Physical Exam Constitutional:      General: He is not in acute distress.    Appearance: He is not ill-appearing.     Comments: Thin, mild temporal wasting  HENT:     Head: Normocephalic and atraumatic.  Eyes:     Conjunctiva/sclera: Conjunctivae normal.  Cardiovascular:     Rate and Rhythm: Normal rate and regular rhythm.     Heart sounds: Normal heart sounds. No murmur heard. Pulmonary:     Effort: Pulmonary effort is normal. No  respiratory distress.     Breath sounds: Normal breath sounds. No wheezing or rales.  Abdominal:     General: There is no distension.     Palpations: Abdomen is soft.     Tenderness: There is no abdominal tenderness.  Musculoskeletal:     Right lower leg: No edema.     Left lower leg: No edema.  Skin:    General: Skin is warm and dry.     Findings: No rash.  Neurological:     Mental Status: He is alert. Mental status is at baseline.  Psychiatric:        Mood and Affect: Mood normal.        Lab Results  Component Value Date   WBC 7.8 11/27/2021   HGB 12.9 (L) 11/27/2021   HCT 38.3 (L) 11/27/2021   PLT 255 11/27/2021   GLUCOSE 96 12/04/2021   CHOL 172 09/30/2021   TRIG 95.0 09/30/2021   HDL 66.90 09/30/2021   LDLCALC 86 09/30/2021    ALT 22 12/04/2021   AST 29 12/04/2021   NA 131 (L) 12/04/2021   K 4.8 Hemolysis seen... 12/04/2021   CL 96 12/04/2021   CREATININE 1.09 12/04/2021   BUN 12 12/04/2021   CO2 26 12/04/2021   TSH 2.46 09/30/2021   PSA 9.52 (H) 08/16/2017   INR 1.01 05/24/2016   HGBA1C 5.7 09/30/2021     Assessment & Plan:    See Problem List for Assessment and Plan of chronic medical problems.

## 2022-02-02 NOTE — Patient Instructions (Signed)
     Blood work was ordered.  Check stool for c diff diarrhea.    Medications changes include :   seroquel 25 mg at bedtime   Your prescription(s) have been sent to your pharmacy.      Return in about 6 weeks (around 03/17/2022) for follow up.

## 2022-02-03 ENCOUNTER — Ambulatory Visit (INDEPENDENT_AMBULATORY_CARE_PROVIDER_SITE_OTHER): Payer: Medicare Other | Admitting: Internal Medicine

## 2022-02-03 VITALS — BP 124/60 | HR 87 | Temp 98.1°F | Ht 72.0 in | Wt 119.0 lb

## 2022-02-03 DIAGNOSIS — A09 Infectious gastroenteritis and colitis, unspecified: Secondary | ICD-10-CM | POA: Diagnosis not present

## 2022-02-03 DIAGNOSIS — I700 Atherosclerosis of aorta: Secondary | ICD-10-CM

## 2022-02-03 DIAGNOSIS — I10 Essential (primary) hypertension: Secondary | ICD-10-CM | POA: Diagnosis not present

## 2022-02-03 DIAGNOSIS — F03911 Unspecified dementia, unspecified severity, with agitation: Secondary | ICD-10-CM | POA: Diagnosis not present

## 2022-02-03 LAB — COMPREHENSIVE METABOLIC PANEL
ALT: 19 U/L (ref 0–53)
AST: 20 U/L (ref 0–37)
Albumin: 4.1 g/dL (ref 3.5–5.2)
Alkaline Phosphatase: 53 U/L (ref 39–117)
BUN: 16 mg/dL (ref 6–23)
CO2: 29 mEq/L (ref 19–32)
Calcium: 9.5 mg/dL (ref 8.4–10.5)
Chloride: 95 mEq/L — ABNORMAL LOW (ref 96–112)
Creatinine, Ser: 0.97 mg/dL (ref 0.40–1.50)
GFR: 75.82 mL/min (ref >60.00)
Glucose, Bld: 85 mg/dL (ref 70–99)
Potassium: 4.5 mEq/L (ref 3.5–5.1)
Sodium: 131 mEq/L — ABNORMAL LOW (ref 135–145)
Total Bilirubin: 0.8 mg/dL (ref 0.2–1.2)
Total Protein: 6.9 g/dL (ref 6.0–8.3)

## 2022-02-03 LAB — CBC WITH DIFFERENTIAL/PLATELET
Basophils Absolute: 0.1 10*3/uL (ref 0.0–0.1)
Basophils Relative: 1 % (ref 0.0–3.0)
Eosinophils Absolute: 0.1 10*3/uL (ref 0.0–0.7)
Eosinophils Relative: 1.4 % (ref 0.0–5.0)
HCT: 36.6 % — ABNORMAL LOW (ref 39.0–52.0)
Hemoglobin: 12.4 g/dL — ABNORMAL LOW (ref 13.0–17.0)
Lymphocytes Relative: 17.8 % (ref 12.0–46.0)
Lymphs Abs: 1.5 10*3/uL (ref 0.7–4.0)
MCHC: 34 g/dL (ref 30.0–36.0)
MCV: 93.6 fl (ref 78.0–100.0)
Monocytes Absolute: 0.9 10*3/uL (ref 0.1–1.0)
Monocytes Relative: 10.5 % (ref 3.0–12.0)
Neutro Abs: 5.8 10*3/uL (ref 1.4–7.7)
Neutrophils Relative %: 69.3 % (ref 43.0–77.0)
Platelets: 240 10*3/uL (ref 150.0–400.0)
RBC: 3.91 Mil/uL — ABNORMAL LOW (ref 4.22–5.81)
RDW: 14 % (ref 11.5–15.5)
WBC: 8.4 10*3/uL (ref 4.0–10.5)

## 2022-02-03 MED ORDER — QUETIAPINE FUMARATE 25 MG PO TABS: 25.0000 mg | ORAL_TABLET | Freq: Every day | ORAL | 5 refills | Status: DC

## 2022-02-03 NOTE — Assessment & Plan Note (Addendum)
Chronic Following with neurology On rivastigmine 1.5 mg bid Sleeping well Experiencing some sundowning and agitation involving episodes in which the police have had to be called We will start Seroquel 25 mg nightly Follow-up in 6 weeks, sooner if needed May need low-dose SSRI

## 2022-02-03 NOTE — Assessment & Plan Note (Signed)
Subacute Because of his dementia he is a very poor historian He still has had some diarrheal episodes, but unable to clarify how often and how much We will order stool studies to rule out C. difficile-he will bring this to the lab if he has additional episodes of diarrhea CBC, CMP

## 2022-02-03 NOTE — Assessment & Plan Note (Signed)
Chronic Continue rosuvastatin 5 mg daily

## 2022-02-03 NOTE — Assessment & Plan Note (Signed)
Chronic BP well controlled Continue losartan 100 mg daily cmp  

## 2022-02-05 ENCOUNTER — Telehealth: Payer: Self-pay

## 2022-02-05 DIAGNOSIS — A0472 Enterocolitis due to Clostridium difficile, not specified as recurrent: Secondary | ICD-10-CM

## 2022-02-05 NOTE — Telephone Encounter (Signed)
Pt wife is requesting to have abx without have the stool sample tested as he continues to have uncontrollable diarrhea.  Please advise

## 2022-02-06 ENCOUNTER — Encounter: Payer: Self-pay | Admitting: Internal Medicine

## 2022-02-07 MED ORDER — VANCOMYCIN HCL 125 MG PO CAPS: ORAL_CAPSULE | ORAL | 0 refills | Status: DC

## 2022-02-07 NOTE — Telephone Encounter (Signed)
Referral ordered for ID     Vanco sent to pharmacy - will give longer course for a few weeks.  ID may change regimen as they see fit or add to it.  If he can bring in a stool sample to confirm cdiff it is a good idea.    Vancomycin 125 mg orally 4 times daily for 10 to 14 days, then 125 mg orally 2 times daily for 7 days, then 125 mg orally once daily for 7 days, then 125 mg orally every 2 to 3 days for 2 to 8 weeks

## 2022-02-08 NOTE — Telephone Encounter (Signed)
Spoke with wife today and abx picked up on yesterday.

## 2022-02-11 ENCOUNTER — Ambulatory Visit: Payer: Medicare Other | Admitting: Internal Medicine

## 2022-02-11 ENCOUNTER — Other Ambulatory Visit: Payer: Self-pay

## 2022-02-11 ENCOUNTER — Encounter: Payer: Self-pay | Admitting: Internal Medicine

## 2022-02-11 VITALS — BP 144/82 | HR 64 | Temp 98.1°F | Ht 72.0 in | Wt 116.0 lb

## 2022-02-11 DIAGNOSIS — A0472 Enterocolitis due to Clostridium difficile, not specified as recurrent: Secondary | ICD-10-CM | POA: Diagnosis not present

## 2022-02-11 NOTE — Progress Notes (Addendum)
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 M with PMHx as below presents for management of c diff colistis. He was diagnosed with cdiff on GIP on 8/1. Started on vanomcyin x 10days. He reports diarrhea improved with vancomycin. Seen by PCP on 8/23 where he continued to have diarrhea.  Due to his underlying dementia it was difficult to tell how many stools he was having.  He was written a prescription for vancomycin with taper dosing.  C. difficile testing ordered but was not completed.  He presents to infectious disease clinic for C. difficile colitis. Today 8/31: He is present with his wife. They noted that pt had been on antibiotics about a month prior to initial c diff episode. Currently, he has no loose stools. Denies fever, chills, abdominal pain. No prior Hx of c diff reported. He noted that he started taking PO vanc on Sunday on 26th, he had about 2 loose stools that day. His diarrhea has significnaly improved while on second round of vanc.  Review of Systems  All other systems reviewed Boone are negative.   Past  Medical History:  Diagnosis Date   Aortic atherosclerosis ([place])    chronic--- followed by pcp   BPH without obstruction/lower urinary tract symptoms    urologist--- dr bell   COPD with emphysema ([place])    followed by pcp---  never used oxygen,  still smokes 1 ppd   Diverticulosis of colon    Full dentures    Heart murmur    per pt's pcp note 02-27-2020 in epic , she heard murmur   Hemorrhoids    History of adenomatous polyp of colon    History of concussion    per pt in [place] ,  no resiudal   History of elevated PSA    negative bx 2019   History of gastroesophageal reflux (GERD)    03-06-2020 per pt no issues for several yrs   History of pneumothorax 2017   traumatic due to fall   Hypertension    followed by pcp   Left inguinal hernia    Macular degeneration, left eye    Mild neurocognitive disorder    neruologist--- dr Delice Lesch   Nodule of right lung    lower lobe--- followed by pcp--- last chest CT 05-07-2019 stable   Pre-diabetes    Wears glasses     Outpatient Medications Prior to Visit  Medication Sig Dispense Refill   fluticasone (FLONASE) 50 MCG/ACT nasal spray USE 2 SPRAYS IN BOTH  NOSTRILS DAILY AS NEEDED  FOR ALLERGIES (Patient taking differently: Place 2 sprays into both nostrils  as needed.) 48 g 1   losartan (COZAAR) 100 MG tablet TAKE 1 TABLET BY MOUTH  DAILY 90 tablet 3   Multiple Vitamins-Minerals (CENTRUM PO) Take 1 tablet by mouth daily.     Multiple Vitamins-Minerals (PRESERVISION AREDS 2 PO) Take 1 capsule by mouth every morning.      QUEtiapine (SEROQUEL) 25 MG tablet Take 1 tablet (25 mg total) by mouth at bedtime. 30 tablet 5   rivastigmine (EXELON) 1.5 MG capsule Take 1 capsule at night for  1 week Boone then one capsule 2 times a day 60 capsule 11   rosuvastatin (CRESTOR) 5 MG tablet TAKE 1 TABLET BY MOUTH  DAILY 90 tablet 3   tamsulosin (FLOMAX) 0.4 MG CAPS capsule Take 1 capsule (0.4 mg total) by mouth daily. 90 capsule 3   vancomycin (VANCOCIN) 125  MG capsule 125 mg orally 4 times daily for 10 days, then 125 mg orally 2 times daily for 7 days, then 125 mg orally once daily for 7 days, then 125 mg orally QOD for 4 weeks 75 capsule 0   vitamin C (ASCORBIC ACID) 500 MG tablet Take 500 mg by mouth daily.     No facility-administered medications prior to visit.     Allergies  Allergen Reactions   Namenda [Memantine] Other (See Comments)    Increased confusion    Social History   Tobacco Use   Smoking status: Every Day    Packs/day: 1.00    Years: 56.00    Total pack years: 56.00    Types: Cigarettes, Pipe   Smokeless tobacco: Never   Tobacco comments:    03-06-2020 per pt quit pipe approx. 02/ 2021,  smoked since age 5  Vaping Use   Vaping Use: Never used  Substance Use Topics   Alcohol use: Yes    Alcohol/week: 7.0 standard drinks of alcohol    Types: 7 Standard drinks or equivalent per week    Comment: 03-06-2020  per pt on drink per day 2-3 oz   Drug use: Never    Family History  Problem Relation Age of Onset   Heart attack Father        Early 13s   Diabetes Father    Dementia Father        Unspecified type   Diabetes Mother    Dementia Mother        Unspecified type, said to be early-stages   Liver cancer Maternal Aunt    Alzheimer's disease Sister        Symptom onset in mid to late 64s   Stroke Neg Hx    COPD Neg Hx    Colon cancer Neg Hx    Esophageal cancer Neg Hx    Rectal cancer Neg Hx    Stomach cancer Neg Hx    Asthma Neg Hx     Objective:   Vitals:   02/11/22 1034  BP: (!) 144/82  Pulse: 64  Temp: 98.1 F (36.7 C)  TempSrc: Oral  Weight: 116 lb (52.6 kg)  Height: 6' (1.829 m)   Body mass index is 15.73 kg/m.  Physical Exam Constitutional:      General: He is not in acute distress.    Appearance: He is normal weight. He is not toxic-appearing.  HENT:     Head: Normocephalic Boone atraumatic.     Right Ear: External ear normal.     Left Ear: External ear normal.     Nose: No  congestion or rhinorrhea.  Mouth/Throat:     Mouth: Mucous membranes are moist.     Pharynx: Oropharynx is clear.  Eyes:     Extraocular Movements: Extraocular movements intact.     Conjunctiva/sclera: Conjunctivae normal.     Pupils: Pupils are equal, round, Boone reactive to light.  Cardiovascular:     Rate Boone Rhythm: Normal rate Boone regular rhythm.     Heart sounds: No murmur heard.    No friction rub. No gallop.  Pulmonary:     Effort: Pulmonary effort is normal.     Breath sounds: Normal breath sounds.  Abdominal:     General: Abdomen is flat. Bowel sounds are normal.     Palpations: Abdomen is soft.  Musculoskeletal:        General: No swelling. Normal range of motion.     Cervical back: Normal range of motion Boone neck supple.  Skin:    General: Skin is warm Boone dry.  Neurological:     General: No focal deficit present.     Mental Status: He is oriented to person, place, Boone time.  Psychiatric:        Mood Boone Affect: Mood normal.     Lab Results: Lab Results  Component Value Date   WBC 8.4 02/03/2022   HGB 12.4 (L) 02/03/2022   HCT 36.6 (L) 02/03/2022   MCV 93.6 02/03/2022   PLT 240.0 02/03/2022    Lab Results  Component Value Date   CREATININE 0.97 02/03/2022   BUN 16 02/03/2022   NA 131 (L) 02/03/2022   K 4.5 02/03/2022   CL 95 (L) 02/03/2022   CO2 29 02/03/2022    Lab Results  Component Value Date   ALT 19 02/03/2022   AST 20 02/03/2022   ALKPHOS 53 02/03/2022   BILITOT 0.8 02/03/2022     Assessment & Plan:  #C.diff diarrhea -On review of record patient has been having diarrhea since June 2023.,  Seen in the ED on 6/16 not able to give stool samples.  He was treated with Cipro Boone Flagyl x7 days.  noted to be subacute diarrhea on 6/23, stool studies were ordered but they were turned in  until 8/1. -Initially diagnosed with C. difficile on 8/1.  C. difficile toxin positive on GIP.  He was treated with vancomycin p.o. x10 days(EOT 8/14) - Patient  started having worsening watery diarrhea on 8/25.  Started on vancomycin taper course by PCP.  He was supposed to return C. difficile stool samples, wife notes he could not because of blow outs on his clothes. - Patient Boone wife provide the history that he had about 2 watery stools a day which raised  concern about C. difficile recurrence around 8/25. - Discussed that C. difficile testing + on GIP can be false positive at times.  I think he will be fine with completing 10 days of p.o. vancomycin, I do not see the need for taper as there is no C. difficile testing as such do not know if this is a recurrent episode. - Plan: -Stop vancomycin at 10 days EOT 02/16/22.   I think we do need dedicated C. difficile testing if diarrhea returns pior to another round of cdiff treatment.  He does have the risk factor for C. difficile as he was on Cipro. -Counseled that if if >3 watery stools/day then turn in c diff testing.  Consider referral to GI for possible colonoscopy as this seems to be a chronic issue at this point. -If on antibiotics  in the next 3 months please call clinic, consider PO vanc PPX at tha time - Follow up PRN  I spent more than 85 minutes for this patient encounter including reviewing data/chart, Boone coordinating care Boone >50% direct face to face time providing counseling/discussing diagnostics/treatment plan with patient  Laurice Record, [facility]   02/11/22  10:41 AM

## 2022-02-11 NOTE — Patient Instructions (Signed)
-  Stop vancomycin at 10 days EOT 02/16/22 -If >3 watery stools/day then turn in c diff testing -If on antibiotics in the next 3 months please call clinic

## 2022-02-12 ENCOUNTER — Ambulatory Visit: Payer: Medicare Other | Admitting: Internal Medicine

## 2022-02-19 ENCOUNTER — Other Ambulatory Visit: Payer: Self-pay | Admitting: Internal Medicine

## 2022-02-19 ENCOUNTER — Other Ambulatory Visit: Payer: Self-pay

## 2022-02-19 ENCOUNTER — Other Ambulatory Visit: Payer: Medicare Other

## 2022-02-19 DIAGNOSIS — A09 Infectious gastroenteritis and colitis, unspecified: Secondary | ICD-10-CM | POA: Diagnosis not present

## 2022-02-19 DIAGNOSIS — R197 Diarrhea, unspecified: Secondary | ICD-10-CM

## 2022-02-21 ENCOUNTER — Encounter: Payer: Self-pay | Admitting: Internal Medicine

## 2022-02-22 LAB — C. DIFFICILE GDH AND TOXIN A/B
GDH ANTIGEN: NOT DETECTED
MICRO NUMBER:: 13894302
SPECIMEN QUALITY:: ADEQUATE
TOXIN A AND B: NOT DETECTED

## 2022-02-23 ENCOUNTER — Telehealth: Payer: Self-pay

## 2022-02-23 NOTE — Telephone Encounter (Signed)
Wife called asking if Cdiff result is positive as she read it as positive on mychart. After verified with provider I did advise it is negative. Patient having no symptoms and patient will not need to follow up unless something changes.

## 2022-02-24 ENCOUNTER — Telehealth: Payer: Self-pay

## 2022-02-24 DIAGNOSIS — R197 Diarrhea, unspecified: Secondary | ICD-10-CM

## 2022-02-24 NOTE — Telephone Encounter (Signed)
Pt wife is wanting to inform Dr. Quay Burow that the pt had a accident of loose stool this morning causing him to mess up his clothes.   Pt is aware that he has to have a BM but is just not able to make it to the restroom.   Pt wife is wanting to get Dr. Quay Burow recommendation on what to do about the loose stool since the results from the C-Diff came back NEG with Dr. Candiss Norse.  Please advise

## 2022-02-24 NOTE — Telephone Encounter (Signed)
I think he needs to see GI to help figure out why he is having diarrhea.    Ideally she needs to keep track of how often he is having it to help [place] better figure out what the cause is.    I will order referral to GI

## 2022-02-24 NOTE — Telephone Encounter (Signed)
Pt wife was calling back to inform Dr. Quay Burow that yet another accident happened.   I saw the message and advised his wife that Dr. Quay Burow recommends logging how often this is happening to help the provider better dx the cause.   I also informed her that a referral for Gertie Fey was made.  Pt has saw Dr. Silvio Pate in the past and she was going to reach out to see if a new referral was needed or it she could schedule for him as they couldn't remember when he was last seen.  FYI

## 2022-02-25 NOTE — Telephone Encounter (Signed)
Stool studies ordered

## 2022-02-25 NOTE — Telephone Encounter (Signed)
Hunter Boone calls back today in regards to PT's onset of diarrhea. They had did a count yesterday as suggested and had gotten up to 5-6 before going to bed, and Hunter Boone stated he had also been going throughout the night but could not count these. They had attempted to get up with their regular GI doctor and were told that it would not be until November before he could see him. PT's other option that was given was for him to do a TOC to a PA within the GI office but that was looking into mid-October and PT also has an appointment with the [place].  At this point Hunter Boone is just not sure what her options are. Do we have anyone else we would suggest for GI that that could go to? Does PT need to be seen within the ER?  CB: [phone]

## 2022-02-25 NOTE — Telephone Encounter (Signed)
It does sound like he was not having any diarrhea and now this is new or much worse than it had been   Options at this point include rechecking his stool to see if there is maybe not C. difficile but a different type of infection.  If there is no infection then it is safe to take over-the-counter Imodium to help slow down the diarrhea, but that is not safe if he has an infection.  Patient go to the emergency room if he is significantly dehydrated or having other concerning symptoms, but in the ED they would most likely given fluids, check blood work, CT scan if he is having abdominal pain and do stool studies and possibly send him home.  Most other GI practices cannot get him in any sooner unfortunately.

## 2022-03-03 MED ORDER — QUETIAPINE FUMARATE 50 MG PO TABS: 50.0000 mg | ORAL_TABLET | Freq: Every day | ORAL | 1 refills | Status: DC

## 2022-03-03 NOTE — Addendum Note (Signed)
Addended by: Binnie Rail on: 03/03/2022 07:54 AM   Modules accepted: Orders

## 2022-03-06 ENCOUNTER — Encounter: Payer: Self-pay | Admitting: Internal Medicine

## 2022-03-06 DIAGNOSIS — A09 Infectious gastroenteritis and colitis, unspecified: Secondary | ICD-10-CM | POA: Insufficient documentation

## 2022-03-08 ENCOUNTER — Telehealth: Payer: Self-pay

## 2022-03-08 ENCOUNTER — Other Ambulatory Visit: Payer: Self-pay

## 2022-03-08 MED ORDER — TAMSULOSIN HCL 0.4 MG PO CAPS: 0.4000 mg | ORAL_CAPSULE | Freq: Every day | ORAL | 3 refills | Status: DC

## 2022-03-08 NOTE — Telephone Encounter (Signed)
MEDICATION: tamsulosin (FLOMAX) 0.4 MG CAPS capsule  PHARMACY: [facility] ([place], [address] [place]  Comments: Pt's wife said the pharmacy faxed [place] two requests with no response.   **Let patient know to contact pharmacy at the end of the day to make sure medication is ready. **  ** Please notify patient to allow 48-72 hours to process**  **Encourage patient to contact the pharmacy for refills or they can request refills through [facility]**

## 2022-03-08 NOTE — Telephone Encounter (Signed)
Script sent to mail order and message left for patient.

## 2022-03-16 ENCOUNTER — Encounter: Payer: Self-pay | Admitting: Internal Medicine

## 2022-03-16 NOTE — Progress Notes (Unsigned)
Subjective:    Patient ID: Hunter Boone, male    DOB: Nov 08, 1945, 76 y.o.   MRN: [id number]     HPI Casen is here for follow up of his chronic medical problems, including diarrhea, dementia.  He is here with his wife who helps supplement the history secondary to his dementia.  B/l feet and ankle swelling - it started about 3 weeks ago.  His feet are always down.  He does not recline or elevate his legs when he is sitting during the day.  He sleeps in the recliner and his feet are down.  This is not new.  He denies other symptoms.  He has no shortness of breath, chest pain or palpitations.  He is taking his medication as prescribed.   Diarrhea is gone.  He still has occasional episodes of diarrhea and incontinence.      Medications and allergies reviewed with patient and updated if appropriate.  Current Outpatient Medications on File Prior to Visit  Medication Sig Dispense Refill   fluticasone (FLONASE) 50 MCG/ACT nasal spray USE 2 SPRAYS IN BOTH  NOSTRILS DAILY AS NEEDED  FOR ALLERGIES (Patient taking differently: Place 2 sprays into both nostrils as needed.) 48 g 1   losartan (COZAAR) 100 MG tablet TAKE 1 TABLET BY MOUTH  DAILY 90 tablet 3   Multiple Vitamins-Minerals (CENTRUM PO) Take 1 tablet by mouth daily.     Multiple Vitamins-Minerals (PRESERVISION AREDS 2 PO) Take 1 capsule by mouth every morning.      QUEtiapine (SEROQUEL) 50 MG tablet Take 1 tablet (50 mg total) by mouth at bedtime. 90 tablet 1   rivastigmine (EXELON) 1.5 MG capsule Take 1 capsule at night for  1 week and then one capsule 2 times a day 60 capsule 11   rosuvastatin (CRESTOR) 5 MG tablet TAKE 1 TABLET BY MOUTH  DAILY 90 tablet 3   tamsulosin (FLOMAX) 0.4 MG CAPS capsule Take 1 capsule (0.4 mg total) by mouth daily. 90 capsule 3   vancomycin (VANCOCIN) 125 MG capsule 125 mg orally 4 times daily for 10 days, then 125 mg orally 2 times daily for 7 days, then 125 mg orally once daily for 7 days, then 125  mg orally QOD for 4 weeks 75 capsule 0   vitamin C (ASCORBIC ACID) 500 MG tablet Take 500 mg by mouth daily.     No current facility-administered medications on file prior to visit.     Review of Systems  Constitutional:  Negative for fever.  Respiratory:  Negative for cough, shortness of breath and wheezing.   Cardiovascular:  Positive for leg swelling. Negative for chest pain and palpitations.  Gastrointestinal:  Negative for abdominal pain and nausea.  Genitourinary:  Negative for difficulty urinating, dysuria and frequency.  Neurological:  Positive for headaches (a few in the early morning). Negative for light-headedness.       Objective:   Vitals:   03/17/22 1341  BP: 128/70  Pulse: 68  Temp: 98 F (36.7 C)  SpO2: 96%   BP Readings from Last 3 Encounters:  03/17/22 128/70  02/11/22 (!) 144/82  02/03/22 124/60   Wt Readings from Last 3 Encounters:  03/17/22 125 lb (56.7 kg)  02/11/22 116 lb (52.6 kg)  02/03/22 119 lb (54 kg)   Body mass index is 16.95 kg/m.    Physical Exam Constitutional:      General: He is not in acute distress.    Appearance: Normal appearance. He  is not ill-appearing.  HENT:     Head: Normocephalic and atraumatic.  Eyes:     Conjunctiva/sclera: Conjunctivae normal.  Cardiovascular:     Rate and Rhythm: Normal rate and regular rhythm.     Heart sounds: Normal heart sounds. No murmur heard. Pulmonary:     Effort: Pulmonary effort is normal. No respiratory distress.     Breath sounds: Normal breath sounds. No wheezing or rales.  Abdominal:     General: There is no distension.     Palpations: Abdomen is soft. There is no mass.     Tenderness: There is abdominal tenderness (Suprapubic tenderness-where the bladder is). There is no guarding or rebound.  Musculoskeletal:     Right lower leg: Edema (1+ pitting edema up to knee) present.     Left lower leg: Edema (1+ pitting edema up to knee) present.  Skin:    General: Skin is warm and  dry.     Findings: No rash.  Neurological:     Mental Status: He is alert. Mental status is at baseline.  Psychiatric:        Mood and Affect: Mood normal.        Lab Results  Component Value Date   WBC 8.4 02/03/2022   HGB 12.4 (L) 02/03/2022   HCT 36.6 (L) 02/03/2022   PLT 240.0 02/03/2022   GLUCOSE 85 02/03/2022   CHOL 172 09/30/2021   TRIG 95.0 09/30/2021   HDL 66.90 09/30/2021   LDLCALC 86 09/30/2021   ALT 19 02/03/2022   AST 20 02/03/2022   NA 131 (L) 02/03/2022   K 4.5 02/03/2022   CL 95 (L) 02/03/2022   CREATININE 0.97 02/03/2022   BUN 16 02/03/2022   CO2 29 02/03/2022   TSH 2.46 09/30/2021   PSA 9.52 (H) 08/16/2017   INR 1.01 05/24/2016   HGBA1C 5.7 09/30/2021     Assessment & Plan:    See Problem List for Assessment and Plan of chronic medical problems.

## 2022-03-17 ENCOUNTER — Ambulatory Visit (INDEPENDENT_AMBULATORY_CARE_PROVIDER_SITE_OTHER): Payer: Medicare Other | Admitting: Internal Medicine

## 2022-03-17 VITALS — BP 128/70 | HR 68 | Temp 98.0°F | Ht 72.0 in | Wt 125.0 lb

## 2022-03-17 DIAGNOSIS — R600 Localized edema: Secondary | ICD-10-CM | POA: Diagnosis not present

## 2022-03-17 DIAGNOSIS — I10 Essential (primary) hypertension: Secondary | ICD-10-CM | POA: Diagnosis not present

## 2022-03-17 DIAGNOSIS — A09 Infectious gastroenteritis and colitis, unspecified: Secondary | ICD-10-CM

## 2022-03-17 DIAGNOSIS — N138 Other obstructive and reflux uropathy: Secondary | ICD-10-CM

## 2022-03-17 DIAGNOSIS — F03911 Unspecified dementia, unspecified severity, with agitation: Secondary | ICD-10-CM | POA: Diagnosis not present

## 2022-03-17 DIAGNOSIS — N3289 Other specified disorders of bladder: Secondary | ICD-10-CM

## 2022-03-17 DIAGNOSIS — N401 Enlarged prostate with lower urinary tract symptoms: Secondary | ICD-10-CM

## 2022-03-17 LAB — CBC WITH DIFFERENTIAL/PLATELET
Basophils Absolute: 0.1 10*3/uL (ref 0.0–0.1)
Basophils Relative: 0.9 % (ref 0.0–3.0)
Eosinophils Absolute: 0.2 10*3/uL (ref 0.0–0.7)
Eosinophils Relative: 2.4 % (ref 0.0–5.0)
HCT: 36.7 % — ABNORMAL LOW (ref 39.0–52.0)
Hemoglobin: 12.3 g/dL — ABNORMAL LOW (ref 13.0–17.0)
Lymphocytes Relative: 18.5 % (ref 12.0–46.0)
Lymphs Abs: 1.6 10*3/uL (ref 0.7–4.0)
MCHC: 33.5 g/dL (ref 30.0–36.0)
MCV: 93.5 fl (ref 78.0–100.0)
Monocytes Absolute: 0.8 10*3/uL (ref 0.1–1.0)
Monocytes Relative: 9.6 % (ref 3.0–12.0)
Neutro Abs: 6 10*3/uL (ref 1.4–7.7)
Neutrophils Relative %: 68.6 % (ref 43.0–77.0)
Platelets: 228 10*3/uL (ref 150.0–400.0)
RBC: 3.93 Mil/uL — ABNORMAL LOW (ref 4.22–5.81)
RDW: 14.1 % (ref 11.5–15.5)
WBC: 8.7 10*3/uL (ref 4.0–10.5)

## 2022-03-17 LAB — COMPREHENSIVE METABOLIC PANEL
ALT: 27 U/L (ref 0–53)
AST: 25 U/L (ref 0–37)
Albumin: 4.4 g/dL (ref 3.5–5.2)
Alkaline Phosphatase: 75 U/L (ref 39–117)
BUN: 17 mg/dL (ref 6–23)
CO2: 29 mEq/L (ref 19–32)
Calcium: 10 mg/dL (ref 8.4–10.5)
Chloride: 99 mEq/L (ref 96–112)
Creatinine, Ser: 0.85 mg/dL (ref 0.40–1.50)
GFR: 84.33 mL/min (ref >60.00)
Glucose, Bld: 86 mg/dL (ref 70–99)
Potassium: 4.2 mEq/L (ref 3.5–5.1)
Sodium: 134 mEq/L — ABNORMAL LOW (ref 135–145)
Total Bilirubin: 0.6 mg/dL (ref 0.2–1.2)
Total Protein: 7.8 g/dL (ref 6.0–8.3)

## 2022-03-17 LAB — URINALYSIS, ROUTINE W REFLEX MICROSCOPIC
Bilirubin Urine: NEGATIVE
Ketones, ur: NEGATIVE
Leukocytes,Ua: NEGATIVE
Nitrite: NEGATIVE
Specific Gravity, Urine: 1.02 (ref 1.000–1.030)
Urine Glucose: NEGATIVE
Urobilinogen, UA: 0.2 (ref 0.0–1.0)
pH: 6.5 (ref 5.0–8.0)

## 2022-03-17 LAB — TSH: TSH: 3.11 u[IU]/mL (ref 0.35–5.50)

## 2022-03-17 MED ORDER — DUTASTERIDE 0.5 MG PO CAPS: 0.5000 mg | ORAL_CAPSULE | Freq: Every day | ORAL | 5 refills | Status: DC

## 2022-03-17 NOTE — Assessment & Plan Note (Signed)
Chronic Blood pressure well controlled CMP Continue losartan 100 mg daily Advised him to monitor his BP at home

## 2022-03-17 NOTE — Assessment & Plan Note (Signed)
Chronic Agitation seems to be controlled with the increased dose of Seroquel at 50 mg daily

## 2022-03-17 NOTE — Assessment & Plan Note (Signed)
Seems to have resolved Monitor No need for further evaluation at this time

## 2022-03-17 NOTE — Assessment & Plan Note (Signed)
Acute Having 3 weeks of bilateral lower extremity pitting edema without any other symptoms No obvious cause 2 years ago he did have a similar episode and was related to BPH, bladder obstruction and AKI He does have some bladder tenderness on exam-possible distention Check UA, urine culture, CBC, CMP, TSH Continue Flomax 0.4 mg daily Start Avodart 0.5 mg daily If kidney function is good will start Lasix, which she did take in the past and it did help Encouraged him to elevate his legs when sitting and laying

## 2022-03-17 NOTE — Patient Instructions (Addendum)
     Blood work was ordered.     Medications changes include :   start avodart for your bladder   Your prescription(s) have been sent to your pharmacy.      Return in about 1 week (around 03/24/2022) for follow up.

## 2022-03-17 NOTE — Assessment & Plan Note (Signed)
Chronic Has not seen urology since 7/22 Taking Flomax 0.4 mg daily He denies difficulty emptying his bladder or any urine symptoms, but with his dementia is hard to know He does have some bladder tenderness In the past he developed edema like this which was related to BPH, bladder obstruction and AKI Check CBC, CMP, UA, urine culture Continue Flomax 0.4 mg daily, start Avodart 0.5 mg daily just in case there is some obstruction May need to see urology again-we will await results

## 2022-03-19 LAB — URINE CULTURE

## 2022-03-20 ENCOUNTER — Encounter: Payer: Self-pay | Admitting: Internal Medicine

## 2022-03-20 MED ORDER — FUROSEMIDE 20 MG PO TABS: 20.0000 mg | ORAL_TABLET | Freq: Every day | ORAL | 0 refills | Status: DC

## 2022-03-20 NOTE — Addendum Note (Signed)
Addended by: Binnie Rail on: 03/20/2022 06:49 PM   Modules accepted: Orders

## 2022-03-23 ENCOUNTER — Ambulatory Visit: Payer: Medicare Other | Admitting: Internal Medicine

## 2022-03-24 ENCOUNTER — Ambulatory Visit: Payer: Medicare Other | Admitting: Internal Medicine

## 2022-03-26 ENCOUNTER — Ambulatory Visit: Payer: Medicare Other | Admitting: Physician Assistant

## 2022-04-01 ENCOUNTER — Ambulatory Visit: Payer: Medicare Other | Admitting: Internal Medicine

## 2022-04-02 ENCOUNTER — Ambulatory Visit: Payer: Medicare Other | Admitting: Physician Assistant

## 2022-04-02 ENCOUNTER — Ambulatory Visit: Payer: Medicare Other | Admitting: Internal Medicine

## 2022-04-05 ENCOUNTER — Encounter: Payer: Self-pay | Admitting: Internal Medicine

## 2022-04-05 NOTE — Progress Notes (Signed)
Subjective:    Patient ID: Hunter Boone, male    DOB: 1946/01/18, 76 y.o.   MRN: [id number]     HPI Delman is here for follow up of his chronic medical problems, including htn, leg edema  Sleeping recliner with legs down.  His wife will put his legs up but they likely do not stay up.  His wife states that once he gets to sleep he is very difficult to wake up and ends up staying there most of the night.  When asked sounds like he takes his nighttime medications sometime between 7 and 9 PM  Taking lasix 20 mg daily for edema.    Medications and allergies reviewed with patient and updated if appropriate.  Current Outpatient Medications on File Prior to Visit  Medication Sig Dispense Refill   dutasteride (AVODART) 0.5 MG capsule Take 1 capsule (0.5 mg total) by mouth daily. 30 capsule 5   fluticasone (FLONASE) 50 MCG/ACT nasal spray USE 2 SPRAYS IN BOTH  NOSTRILS DAILY AS NEEDED  FOR ALLERGIES (Patient taking differently: Place 2 sprays into both nostrils as needed.) 48 g 1   furosemide (LASIX) 20 MG tablet Take 1 tablet (20 mg total) by mouth daily. 30 tablet 0   losartan (COZAAR) 100 MG tablet TAKE 1 TABLET BY MOUTH  DAILY 90 tablet 3   Multiple Vitamins-Minerals (CENTRUM PO) Take 1 tablet by mouth daily.     Multiple Vitamins-Minerals (PRESERVISION AREDS 2 PO) Take 1 capsule by mouth every morning.      QUEtiapine (SEROQUEL) 50 MG tablet Take 1 tablet (50 mg total) by mouth at bedtime. 90 tablet 1   rivastigmine (EXELON) 1.5 MG capsule Take 1 capsule at night for  1 week and then one capsule 2 times a day 60 capsule 11   rosuvastatin (CRESTOR) 5 MG tablet TAKE 1 TABLET BY MOUTH  DAILY 90 tablet 3   tamsulosin (FLOMAX) 0.4 MG CAPS capsule Take 1 capsule (0.4 mg total) by mouth daily. 90 capsule 3   vitamin C (ASCORBIC ACID) 500 MG tablet Take 500 mg by mouth daily.     No current facility-administered medications on file prior to visit.     Review of Systems   Constitutional:  Negative for fever.  Respiratory:  Negative for shortness of breath.   Cardiovascular:  Positive for leg swelling. Negative for chest pain and palpitations.  Neurological:  Negative for light-headedness and headaches.       Objective:   Vitals:   04/06/22 1320  BP: 114/80  Pulse: 78  Temp: 98 F (36.7 C)  SpO2: 99%   BP Readings from Last 3 Encounters:  04/06/22 114/80  03/17/22 128/70  02/11/22 (!) 144/82   Wt Readings from Last 3 Encounters:  04/06/22 121 lb (54.9 kg)  03/17/22 125 lb (56.7 kg)  02/11/22 116 lb (52.6 kg)   Body mass index is 16.41 kg/m.    Physical Exam Constitutional:      General: He is not in acute distress.    Appearance: Normal appearance. He is not ill-appearing.  HENT:     Head: Normocephalic and atraumatic.  Eyes:     Conjunctiva/sclera: Conjunctivae normal.  Cardiovascular:     Rate and Rhythm: Normal rate and regular rhythm.     Heart sounds: Normal heart sounds. No murmur heard. Pulmonary:     Effort: Pulmonary effort is normal. No respiratory distress.     Breath sounds: Normal breath sounds. No wheezing or  rales.  Musculoskeletal:     Right lower leg: Edema (1+ pitting edema entire lower leg) present.     Left lower leg: Edema (1+ pitting edema entire lower leg) present.  Skin:    General: Skin is warm and dry.     Findings: No rash.  Neurological:     Mental Status: He is alert. Mental status is at baseline.  Psychiatric:        Mood and Affect: Mood normal.        Lab Results  Component Value Date   WBC 8.7 03/17/2022   HGB 12.3 (L) 03/17/2022   HCT 36.7 (L) 03/17/2022   PLT 228.0 03/17/2022   GLUCOSE 86 03/17/2022   CHOL 172 09/30/2021   TRIG 95.0 09/30/2021   HDL 66.90 09/30/2021   LDLCALC 86 09/30/2021   ALT 27 03/17/2022   AST 25 03/17/2022   NA 134 (L) 03/17/2022   K 4.2 03/17/2022   CL 99 03/17/2022   CREATININE 0.85 03/17/2022   BUN 17 03/17/2022   CO2 29 03/17/2022   TSH 3.11  03/17/2022   PSA 9.52 (H) 08/16/2017   INR 1.01 05/24/2016   HGBA1C 5.7 09/30/2021     Assessment & Plan:    See Problem List for Assessment and Plan of chronic medical problems.

## 2022-04-05 NOTE — Patient Instructions (Addendum)
    Your leg swelling is from sleeping with your legs down.    Start sleeping in the bed nightly.   Take the two night time medications just before bedtime - not prior.     Once the swelling is controlled, stop the lasix - ok to take as needed only for swelling.     Return in about 6 months (around 10/06/2022) for follow up.

## 2022-04-06 ENCOUNTER — Ambulatory Visit (INDEPENDENT_AMBULATORY_CARE_PROVIDER_SITE_OTHER): Payer: Medicare Other | Admitting: Internal Medicine

## 2022-04-06 VITALS — BP 114/80 | HR 78 | Temp 98.0°F | Ht 72.0 in | Wt 121.0 lb

## 2022-04-06 DIAGNOSIS — R600 Localized edema: Secondary | ICD-10-CM

## 2022-04-06 DIAGNOSIS — I10 Essential (primary) hypertension: Secondary | ICD-10-CM | POA: Diagnosis not present

## 2022-04-06 DIAGNOSIS — F03911 Unspecified dementia, unspecified severity, with agitation: Secondary | ICD-10-CM

## 2022-04-06 NOTE — Assessment & Plan Note (Signed)
Chronic Does have some anger episodes His wife states at this point they are manageable and will let me know when they are not manageable Continue Seroquel 50 mg at bedtime-stressed this must be given at bedtime because it is sedating

## 2022-04-06 NOTE — Assessment & Plan Note (Signed)
Chronic Blood pressure well controlled Continue losartan 100 mg daily

## 2022-04-06 NOTE — Assessment & Plan Note (Signed)
Subacute Likely related to him sleeping in the recliner with his legs down most of the night having his legs down most of the day.  He does elevate them some, but not as much as it should He is taking his nighttime medication, which includes his Seroquel sometime between 7 and 9 PM and falling asleep in the recliner and then not going to bed.  He states he would prefer to sleep in bed Move nighttime medications to bedtime-discussed these medication should be taken at bedtime because the Seroquel does make him drowsy Getting back to sleeping in the bed should help Stressed elevating his legs during the day whenever he sits Encouraged regular walking Continue Lasix 20 mg daily for now-discussed that once the swelling goes away which I expected to he can take as needed only Advised his wife to contact me with any questions

## 2022-04-14 ENCOUNTER — Other Ambulatory Visit (HOSPITAL_BASED_OUTPATIENT_CLINIC_OR_DEPARTMENT_OTHER): Payer: Self-pay

## 2022-04-14 MED ORDER — COMIRNATY 30 MCG/0.3ML IM SUSY
PREFILLED_SYRINGE | INTRAMUSCULAR | 0 refills | Status: DC
  Filled 2022-04-14: qty 0.3, 1d supply, fill #0

## 2022-04-14 MED ORDER — INFLUENZA VAC A&B SA ADJ QUAD 0.5 ML IM PRSY
PREFILLED_SYRINGE | INTRAMUSCULAR | 0 refills | Status: DC
  Filled 2022-04-14: qty 0.5, 1d supply, fill #0

## 2022-04-29 ENCOUNTER — Other Ambulatory Visit: Payer: Self-pay | Admitting: Internal Medicine

## 2022-06-03 ENCOUNTER — Encounter: Payer: Self-pay | Admitting: Internal Medicine

## 2022-06-03 ENCOUNTER — Telehealth: Payer: Self-pay | Admitting: Physician Assistant

## 2022-06-03 NOTE — Telephone Encounter (Signed)
Pt's wife called in stating she would like to increase the pt's Seroquel '50mg'$  and would like Sara's opinion. She tried to contact Dr. Quay Burow, but she is going to be out until 06/15/22. She would like to see if she could increase it to '100mg'$ .

## 2022-06-04 NOTE — Telephone Encounter (Signed)
Patient is going to ask Dr. Eilleen Kempf office.

## 2022-06-15 MED ORDER — QUETIAPINE FUMARATE 50 MG PO TABS: 100.0000 mg | ORAL_TABLET | Freq: Every day | ORAL | 1 refills | Status: DC

## 2022-06-17 ENCOUNTER — Encounter: Payer: Self-pay | Admitting: Internal Medicine

## 2022-06-22 ENCOUNTER — Encounter: Payer: Self-pay | Admitting: Internal Medicine
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
## 2022-06-26 MED ORDER — ESCITALOPRAM OXALATE 10 MG PO TABS: 10.0000 mg | ORAL_TABLET | Freq: Every day | ORAL | 5 refills | Status: AC

## 2022-06-26 NOTE — Addendum Note (Signed)
Addended by: Binnie Rail on: 06/26/2022 06:29 AM   Modules accepted: Orders

## 2022-07-05 ENCOUNTER — Ambulatory Visit: Payer: Medicare Other | Admitting: Physician Assistant

## 2022-07-07 ENCOUNTER — Telehealth: Payer: Self-pay
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
## 2022-07-07 NOTE — Telephone Encounter (Signed)
Called Pt wife and informed her of answer per Sharene Butters [facility]. She understood, she said she understood that it all comes down to their  decision she is just concerned about making that change.

## 2022-07-10 ENCOUNTER — Encounter: Payer: Self-pay | Admitting: Internal Medicine

## 2022-07-15 DIAGNOSIS — Z0289 Encounter for other administrative examinations: Secondary | ICD-10-CM

## 2022-07-16 ENCOUNTER — Telehealth: Payer: Self-pay

## 2022-07-16 ENCOUNTER — Telehealth: Payer: Self-pay | Admitting: Internal Medicine

## 2022-07-16 ENCOUNTER — Other Ambulatory Visit: Payer: Self-pay

## 2022-07-16 DIAGNOSIS — Z111 Encounter for screening for respiratory tuberculosis: Secondary | ICD-10-CM

## 2022-07-16 MED ORDER — QUETIAPINE FUMARATE 100 MG PO TABS: 100.0000 mg | ORAL_TABLET | Freq: Every day | ORAL | 1 refills | Status: AC

## 2022-07-16 NOTE — Telephone Encounter (Signed)
Form completed and faxed.  Will resend once TB gold lab is back.

## 2022-07-16 NOTE — Telephone Encounter (Signed)
Crystal with [facility] called for a status update on FL2 that was sent on 1/29. Call back number is [phone]

## 2022-07-16 NOTE — Telephone Encounter (Signed)
Crystal states what is already completed can be sent via Fax to [phone] and then when fully completed can be re-faxed as well.

## 2022-07-16 NOTE — Telephone Encounter (Signed)
Left message for Hunter Boone.  If she calls back please find out if she wants me to fax her what I have or if she wants to wait until patient completes his TB blood test.

## 2022-07-16 NOTE — Telephone Encounter (Signed)
sent

## 2022-07-19 ENCOUNTER — Telehealth: Payer: Self-pay | Admitting: Internal Medicine

## 2022-07-19 ENCOUNTER — Other Ambulatory Visit: Payer: Medicare Other

## 2022-07-19 DIAGNOSIS — Z111 Encounter for screening for respiratory tuberculosis: Secondary | ICD-10-CM

## 2022-07-19 NOTE — Telephone Encounter (Signed)
I can send patches, but the dose is depending on how much he smokes.  Does he smoke approximately 1 pack/day, half pack per day or quarter pack per day?

## 2022-07-19 NOTE — Telephone Encounter (Signed)
PT's spouse calls today in need of a prescription for nicotine patches. PT's spouse states that PT will be admitted into a facility within the next 10 days and PT does smoke. They did not specify what brand of nicotine patches they need only the pharmacy [place], [place] if needed: [phone]

## 2022-07-19 NOTE — Telephone Encounter (Signed)
Is it ok to send nicotine patches.Marland KitchenJohny Boone

## 2022-07-20 MED ORDER — NICOTINE 21 MG/24HR TD PT24: 21.0000 mg | MEDICATED_PATCH | Freq: Every day | TRANSDERMAL | 0 refills | Status: DC

## 2022-07-20 NOTE — Telephone Encounter (Signed)
Nicotine 21 mg patches sent to [facility].  Can decrease the dose after a month which would be equivalent to half pack a day.

## 2022-07-20 NOTE — Telephone Encounter (Signed)
Called pt spoke w/ wife she states he smokes about a pack a day.Marland KitchenJohny Boone

## 2022-07-21 LAB — QUANTIFERON-TB GOLD PLUS
Mitogen-NIL: 9.65 IU/mL
NIL: 0.02 IU/mL
QuantiFERON-TB Gold Plus: NEGATIVE
TB1-NIL: 0 IU/mL
TB2-NIL: 0.01 IU/mL

## 2022-07-22 NOTE — Telephone Encounter (Signed)
TB gold results faxed today

## 2022-08-02 ENCOUNTER — Telehealth: Payer: Self-pay | Admitting: Internal Medicine

## 2022-08-02 NOTE — Telephone Encounter (Signed)
Called wife she states Dr. Quay Burow told them not to take the Avodart right now, but he is still taking the Tamulosin. She states the memory care is giving him both . She is wanting this updated and will hve [facility] in [place] to get a correct copy of the med list. Avodart is still on pt med list pls advise.Marland KitchenJohny Chess

## 2022-08-02 NOTE — Telephone Encounter (Signed)
Updated med list faxed with recent orders today regarding patient.

## 2022-08-02 NOTE — Telephone Encounter (Signed)
Avodart removed from list.  Please send updated list

## 2022-08-02 NOTE — Telephone Encounter (Signed)
Patient's wife called and said that she thinks his medication list is wrong at [facility] facility.  She would like someone to call her and go over all of his medications with her.  Patient's wife's phone # [phone]

## 2022-08-03 ENCOUNTER — Ambulatory Visit: Payer: Medicare Other | Admitting: Physician Assistant

## 2022-08-03 DIAGNOSIS — M79675 Pain in left toe(s): Secondary | ICD-10-CM | POA: Diagnosis not present

## 2022-08-03 DIAGNOSIS — M79674 Pain in right toe(s): Secondary | ICD-10-CM | POA: Diagnosis not present

## 2022-08-03 DIAGNOSIS — B351 Tinea unguium: Secondary | ICD-10-CM | POA: Diagnosis not present

## 2022-08-26 ENCOUNTER — Telehealth: Payer: Self-pay | Admitting: Internal Medicine

## 2022-08-26 MED ORDER — ACETAMINOPHEN 325 MG PO TABS: 650.0000 mg | ORAL_TABLET | Freq: Four times a day (QID) | ORAL | 3 refills | Status: DC | PRN

## 2022-08-26 NOTE — Telephone Encounter (Signed)
Rx printed

## 2022-08-26 NOTE — Telephone Encounter (Signed)
Script faxed today

## 2022-08-26 NOTE — Telephone Encounter (Signed)
Patient's wife called and said patient is currently residing at [facility] in [place]. They are unable to give him any medication without doctor's orders and he has pain in his left hip. It was the hip he has had surgey on and they just need to give him standard Tylenol. Last OV was 04/06/22. Best callback is [phone].

## 2022-09-07 ENCOUNTER — Ambulatory Visit (INDEPENDENT_AMBULATORY_CARE_PROVIDER_SITE_OTHER): Payer: Medicare Other

## 2022-09-07 VITALS — Ht 72.0 in | Wt 128.0 lb

## 2022-09-07 DIAGNOSIS — Z0000 Encounter for general adult medical examination without abnormal findings: Secondary | ICD-10-CM

## 2022-09-07 NOTE — Progress Notes (Signed)
I connected with  DEVYNE WADDING and wife on 09/07/22 by a audio enabled telemedicine application and verified that I am speaking with the correct person using two identifiers.  Patient Location: [facility])  Provider Location: Office/Clinic  I discussed the limitations of evaluation and management by telemedicine. The patient expressed understanding and agreed to proceed.  Subjective:   FON RIJO is a 77 y.o. male who presents for Medicare Annual/Subsequent preventive examination.  Review of Systems     Cardiac Risk Factors include: advanced age (>26men, >75 women);dyslipidemia;family history of premature cardiovascular disease;hypertension;male gender;sedentary lifestyle     Objective:    Today's Vitals   09/07/22 1305  Weight: 128 lb (58.1 kg)  Height: 6' (1.829 m)  PainSc: 0-No pain   Body mass index is 17.36 kg/m.     09/07/2022    1:07 PM 12/31/2021    9:34 AM 11/27/2021   11:22 AM 10/01/2021    9:58 AM 09/03/2021    1:36 PM 03/12/2020    9:39 AM 09/05/2019   10:11 AM  Advanced Directives  Does Patient Have a Medical Advance Directive? Yes No No No No No No  Type of Paramedic of [place];Living will        Copy of [place] in Chart? No - copy requested        Would patient like information on creating a medical advance directive?     No - Patient declined Yes (MAU/Ambulatory/Procedural Areas - Information given)     Current Medications (verified) Outpatient Encounter Medications as of 09/07/2022  Medication Sig   acetaminophen (TYLENOL) 325 MG tablet Take 2 tablets (650 mg total) by mouth every 6 (six) hours as needed.   escitalopram (LEXAPRO) 10 MG tablet Take 1 tablet (10 mg total) by mouth daily.   fluticasone (FLONASE) 50 MCG/ACT nasal spray USE 2 SPRAYS IN BOTH  NOSTRILS DAILY AS NEEDED  FOR ALLERGIES (Patient taking differently: Place 2 sprays into both nostrils as needed.)    furosemide (LASIX) 20 MG tablet Take 1 tablet (20 mg total) by mouth daily.   losartan (COZAAR) 100 MG tablet TAKE 1 TABLET BY MOUTH  DAILY   Multiple Vitamins-Minerals (CENTRUM PO) Take 1 tablet by mouth daily.   Multiple Vitamins-Minerals (PRESERVISION AREDS 2 PO) Take 1 capsule by mouth every morning.    nicotine (NICODERM CQ - DOSED IN MG/24 HOURS) 21 mg/24hr patch Place 1 patch (21 mg total) onto the skin daily.   QUEtiapine (SEROQUEL) 100 MG tablet Take 1 tablet (100 mg total) by mouth at bedtime.   rivastigmine (EXELON) 1.5 MG capsule Take 1 capsule at night for  1 week and then one capsule 2 times a day   rosuvastatin (CRESTOR) 5 MG tablet TAKE 1 TABLET BY MOUTH  DAILY   tamsulosin (FLOMAX) 0.4 MG CAPS capsule Take 1 capsule (0.4 mg total) by mouth daily.   vitamin C (ASCORBIC ACID) 500 MG tablet Take 500 mg by mouth daily.   No facility-administered encounter medications on file as of 09/07/2022.    Allergies (verified) Namenda [memantine]   History: Past Medical History:  Diagnosis Date   Aortic atherosclerosis ([place])    chronic--- followed by pcp   BPH without obstruction/lower urinary tract symptoms    urologist--- dr bell   COPD with emphysema ([place])    followed by pcp---  never used oxygen,  still smokes 1 ppd   Diverticulosis of colon    Full dentures  Heart murmur    per pt's pcp note 02-27-2020 in epic , she heard murmur   Hemorrhoids    History of adenomatous polyp of colon    History of concussion    per pt in [place] ,  no resiudal   History of elevated PSA    negative bx 2019   History of gastroesophageal reflux (GERD)    03-06-2020 per pt no issues for several yrs   History of pneumothorax 2017   traumatic due to fall   Hypertension    followed by pcp   Left inguinal hernia    Macular degeneration, left eye    Mild neurocognitive disorder    neruologist--- dr Delice Lesch   Nodule of right lung    lower lobe--- followed by pcp--- last chest CT 05-07-2019  stable   Pre-diabetes    Wears glasses    Past Surgical History:  Procedure Laterality Date   COLONOSCOPY  last one 04-20-2016   EVALUATION UNDER ANESTHESIA WITH HEMORRHOIDECTOMY N/A 07/01/2017   Procedure: ANORECTAL EXAMINATION UNDER ANESTHESIA, HEMORRHOIDECTOMY, HEMORRHOIDAL LIGATION/PEXY;  Surgeon: Michael Boston, MD;  Location: [facility];  Service: General;  Laterality: N/A;  GENERAL AND LOCAL   INGUINAL HERNIA REPAIR N/A 03/12/2020   Procedure: LAPARSCOPIC LEFT INGUINAL HERNIA REPAIR; BILATERAL FEMORAL HERNIA REPAIR; BILATERAL TAP BLOCK;  Surgeon: Michael Boston, MD;  Location: [facility];  Service: General;  Laterality: N/A;   LAPAROSCOPIC ABDOMINAL EXPLORATION N/A 03/12/2020   Procedure: LAPAROSCOPIC  EXPLORATION AND REPAIRS OF HERNIAS FOUND;  Surgeon: Michael Boston, MD;  Location: [facility];  Service: General;  Laterality: N/A;   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   Procedure: TOTAL HIP ARTHROPLASTY ANTERIOR APPROACH;  Surgeon: Rod Can, MD;  Location: [facility];  Service: Orthopedics;  Laterality: Left;   Family History  Problem Relation Age of Onset   Heart attack Father        Early 5s   Diabetes Father    Dementia Father        Unspecified type   Diabetes Mother    Dementia Mother        Unspecified type, said to be early-stages   Liver cancer Maternal Aunt    Alzheimer's disease Sister        Symptom onset in mid to late 31s   Stroke Neg Hx    COPD Neg Hx    Colon cancer Neg Hx    Esophageal cancer Neg Hx    Rectal cancer Neg Hx    Stomach cancer Neg Hx    Asthma Neg Hx    Social History   Socioeconomic History   Marital status: Married    Spouse name: Not on file   Number of children: Not on file   Years of education: 14   Highest education level: Associate degree: academic program  Occupational History   Occupation: Company secretary  Tobacco Use   Smoking status: Former    Packs/day: 1.00    Years: 56.00    Additional pack  years: 0.00    Total pack years: 56.00    Types: Cigarettes, Pipe   Smokeless tobacco: Never   Tobacco comments:    03-06-2020 per pt quit pipe approx. 02/ 2021,  smoked since age 67    09/07/2022 quit smoking in February 2024  Vaping Use   Vaping Use: Never used  Substance and Sexual Activity   Alcohol use: Not Currently   Drug use: Never   Sexual activity: Not Currently  Other  Topics Concern   Not on file  Social History Narrative   Lives with wife in a split level home      Right handed      Highest Level of edu- some college   Social Determinants of Health   Financial Resource Strain: Low Risk  (09/07/2022)   Overall Financial Resource Strain (CARDIA)    Difficulty of Paying Living Expenses: Not hard at all  Food Insecurity: No Food Insecurity (09/07/2022)   Hunger Vital Sign    Worried About Running Out of Food in the Last Year: Never true    Ran Out of Food in the Last Year: Never true  Transportation Needs: No Transportation Needs (09/07/2022)   PRAPARE - Hydrologist (Medical): No    Lack of Transportation (Non-Medical): No  Physical Activity: Inactive (09/07/2022)   Exercise Vital Sign    Days of Exercise per Week: 0 days    Minutes of Exercise per Session: 0 min  Stress: No Stress Concern Present (09/07/2022)   [place]    Feeling of Stress : Not at all  Social Connections: Unknown (09/07/2022)   Social Connection and Isolation Panel [NHANES]    Frequency of Communication with Friends and Family: Patient declined    Frequency of Social Gatherings with Friends and Family: Patient declined    Attends Religious Services: Patient declined    Active Member of Clubs or Organizations: Not on file    Attends Archivist Meetings: Patient declined    Marital Status: Married    Tobacco Counseling Counseling given: Not Answered Tobacco comments: 03-06-2020 per pt quit  pipe approx. 02/ 2021,  smoked since age 37 09/07/2022 quit smoking in February 2024   Clinical Intake:  Pre-visit preparation completed: Yes  Pain : No/denies pain Pain Score: 0-No pain     BMI - recorded: 17.36 Nutritional Status: BMI <19  Underweight Nutritional Risks: None Diabetes: No  How often do you need to have someone help you when you read instructions, pamphlets, or other written materials from your doctor or pharmacy?: 1 - Never What is the last grade level you completed in school?: HSG  Diabetic? No  Interpreter Needed?: No  Information entered by :: Lisette Abu, LPN.   Activities of Daily Living    09/07/2022    1:19 PM  In your present state of health, do you have any difficulty performing the following activities:  Hearing? 0  Vision? 0  Difficulty concentrating or making decisions? 1  Walking or climbing stairs? 0  Dressing or bathing? 1  Doing errands, shopping? 1  Preparing Food and eating ? Y  Using the Toilet? Y  In the past six months, have you accidently leaked urine? Y  Do you have problems with loss of bowel control? N  Managing your Medications? Y  Managing your Finances? Y  Housekeeping or managing your Housekeeping? Y    Patient Care Team: Binnie Rail, MD as PCP - General (Internal Medicine) Michael Boston, MD as Consulting Physician (General Surgery) Pyrtle, Lajuan Lines, MD as Consulting Physician (Gastroenterology) Cameron Sprang, MD as Consulting Physician (Neurology)  Indicate any recent Medical Services you may have received from other than [facility] providers in the past year (date may be approximate).     Assessment:   This is a routine wellness examination for Sanat.  Hearing/Vision screen Hearing Screening - Comments:: Denies hearing difficulties   Vision Screening -  Comments:: Wears rx glasses - up to date with routine eye exams with Webb Laws, OD.   Dietary issues and exercise activities discussed: Current  Exercise Habits: The patient does not participate in regular exercise at present, Exercise limited by: neurologic condition(s);respiratory conditions(s)   Goals Addressed             This Visit's Progress    Patient Stated       Per wife: Continue to visit husband in SNF twice a week or more.      Depression Screen    09/07/2022    1:16 PM 02/03/2022    1:47 PM 12/04/2021   10:56 AM 09/03/2021    1:35 PM 08/29/2020   12:31 PM 08/27/2019    9:53 AM 05/12/2017    3:46 PM  PHQ 2/9 Scores  PHQ - 2 Score 0 0 0 1 0 0 0  PHQ- 9 Score  0   1      Fall Risk    09/07/2022    1:08 PM 02/03/2022    1:47 PM 12/31/2021    9:34 AM 12/04/2021   10:56 AM 10/01/2021    9:58 AM  Fall Risk   Falls in the past year? 0 0 0 0 0  Number falls in past yr: 0 0 0  0  Injury with Fall? 0 0 0  0  Risk for fall due to : No Fall Risks No Fall Risks     Follow up Falls prevention discussed Falls evaluation completed       FALL RISK PREVENTION PERTAINING TO THE HOME:  Any stairs in or around the home? No  If so, are there any without handrails? No  Home free of loose throw rugs in walkways, pet beds, electrical cords, etc? Yes  Adequate lighting in your home to reduce risk of falls? Yes   ASSISTIVE DEVICES UTILIZED TO PREVENT FALLS:  Life alert? Yes  Use of a cane, walker or w/c? No  Grab bars in the bathroom? Yes  Shower chair or bench in shower? Yes  Elevated toilet seat or a handicapped toilet? Yes   TIMED UP AND GO:  Was the test performed? No . Telephonic Visit   Cognitive Function:      10/01/2021   10:00 AM 01/23/2019    8:00 AM  Montreal Cognitive Assessment   Visuospatial/ Executive (0/5) 1   Naming (0/3) 2   Attention: Read list of digits (0/2) 2 2  Attention: Read list of letters (0/1) 1 1  Attention: Serial 7 subtraction starting at 100 (0/3) 1 3  Language: Repeat phrase (0/2) 2 2  Language : Fluency (0/1) 1 0  Abstraction (0/2) 1 1  Delayed Recall (0/5) 0 5  Orientation  (0/6) 1 6  Total 12   Adjusted Score (based on education) 12       Immunizations Immunization History  Administered Date(s) Administered   COVID-19, mRNA, vaccine(Comirnaty)12 years and older 04/14/2022   Fluad Quad(high Dose 65+) 03/15/2019, 02/27/2020, 03/31/2021, 04/14/2022   Influenza Whole 03/14/2012   Influenza, High Dose Seasonal PF 04/17/2013, 03/12/2014, 04/15/2015, 03/09/2016, 03/28/2018, 04/05/2019   PFIZER(Purple Top)SARS-COV-2 Vaccination 08/04/2019, 08/28/2019, 04/12/2020   Pfizer Covid-19 Vaccine Bivalent Booster 59yrs & up 04/08/2021, 04/14/2022   Pneumococcal Conjugate-13 06/11/2016   Pneumococcal Polysaccharide-23 08/19/2010   Td 07/27/2004   Tdap 06/05/2012, 04/01/2022   Zoster Recombinat (Shingrix) 07/13/2018, 02/09/2019   Zoster, Live 01/23/2013    TDAP status: Up to date  Flu Vaccine status: Up  to date  Pneumococcal vaccine status: Up to date  Covid-19 vaccine status: Completed vaccines  Qualifies for Shingles Vaccine? Yes   Zostavax completed Yes   Shingrix Completed?: Yes  Screening Tests Health Maintenance  Topic Date Due   Lung Cancer Screening  04/09/2022   COVID-19 Vaccine (6 - 2023-24 season) 06/09/2022   Medicare Annual Wellness (AWV)  09/07/2023   DTaP/Tdap/Td (4 - Td or Tdap) 04/01/2032   Pneumonia Vaccine 38+ Years old  Completed   INFLUENZA VACCINE  Completed   Hepatitis C Screening  Completed   Zoster Vaccines- Shingrix  Completed   HPV VACCINES  Aged Out   COLONOSCOPY (Pts 45-72yrs Insurance coverage will need to be confirmed)  Discontinued    Health Maintenance  Health Maintenance Due  Topic Date Due   Lung Cancer Screening  04/09/2022   COVID-19 Vaccine (6 - 2023-24 season) 06/09/2022    Colorectal cancer screening: No longer required.   Lung Cancer Screening: (Low Dose CT Chest recommended if Age 34-80 years, 30 pack-year currently smoking OR have quit w/in 15years.) does qualify.   Lung Cancer Screening Referral:  No; currently in SNF  Additional Screening:  Hepatitis C Screening: does qualify; Completed 02/02/2017  Vision Screening: Recommended annual ophthalmology exams for early detection of glaucoma and other disorders of the eye. Is the patient up to date with their annual eye exam?  Yes  Who is the provider or what is the name of the office in which the patient attends annual eye exams? Howard McFarland, OD. If pt is not established with a provider, would they like to be referred to a provider to establish care? No .   Dental Screening: Recommended annual dental exams for proper oral hygiene  Community Resource Referral / Chronic Care Management: CRR required this visit?  No   CCM required this visit?  No      Plan:     I have personally reviewed and noted the following in the patient's chart:   Medical and social history Use of alcohol, tobacco or illicit drugs  Current medications and supplements including opioid prescriptions. Patient is not currently taking opioid prescriptions. Functional ability and status Nutritional status Physical activity Advanced directives List of other physicians Hospitalizations, surgeries, and ER visits in previous 12 months Vitals Screenings to include cognitive, depression, and falls Referrals and appointments  In addition, I have reviewed and discussed with patient certain preventive protocols, quality metrics, and best practice recommendations. A written personalized care plan for preventive services as well as general preventive health recommendations were provided to patient.     Sheral Flow, LPN   [id number]   Nurse Notes:  Patient has current diagnosis of cognitive impairment.  Patient is followed by neurology for ongoing assessment. Patient is unable to complete screening 6CIT or MMSE.

## 2022-09-07 NOTE — Patient Instructions (Signed)
Hunter Boone , Thank you for taking time to come for your Medicare Wellness Visit. I appreciate your ongoing commitment to your health goals. Please review the following plan we discussed and let me know if I can assist you in the future.   These are the goals we discussed:  Goals      Patient Stated     Per wife: Continue to visit husband in SNF twice a week or more.        This is a list of the screening recommended for you and due dates:  Health Maintenance  Topic Date Due   Screening for Lung Cancer  04/09/2022   COVID-19 Vaccine (6 - 2023-24 season) 06/09/2022   Medicare Annual Wellness Visit  09/07/2023   DTaP/Tdap/Td vaccine (4 - Td or Tdap) 04/01/2032   Pneumonia Vaccine  Completed   Flu Shot  Completed   Hepatitis C Screening: USPSTF Recommendation to screen - Ages 18-79 yo.  Completed   Zoster (Shingles) Vaccine  Completed   HPV Vaccine  Aged Out   Colon Cancer Screening  Discontinued    Advanced directives: Yes  Conditions/risks identified: Yes  Next appointment: Follow up in one year for your annual wellness visit.   Preventive Care 39 Years and Older, Male  Preventive care refers to lifestyle choices and visits with your health care provider that can promote health and wellness. What does preventive care include? A yearly physical exam. This is also called an annual well check. Dental exams once or twice a year. Routine eye exams. Ask your health care provider how often you should have your eyes checked. Personal lifestyle choices, including: Daily care of your teeth and gums. Regular physical activity. Eating a healthy diet. Avoiding tobacco and drug use. Limiting alcohol use. Practicing safe sex. Taking low doses of aspirin every day. Taking vitamin and mineral supplements as recommended by your health care provider. What happens during an annual well check? The services and screenings done by your health care provider during your annual well check will  depend on your age, overall health, lifestyle risk factors, and family history of disease. Counseling  Your health care provider may ask you questions about your: Alcohol use. Tobacco use. Drug use. Emotional well-being. Home and relationship well-being. Sexual activity. Eating habits. History of falls. Memory and ability to understand (cognition). Work and work Statistician. Screening  You may have the following tests or measurements: Height, weight, and BMI. Blood pressure. Lipid and cholesterol levels. These may be checked every 5 years, or more frequently if you are over 38 years old. Skin check. Lung cancer screening. You may have this screening every year starting at age 3 if you have a 30-pack-year history of smoking and currently smoke or have quit within the past 15 years. Fecal occult blood test (FOBT) of the stool. You may have this test every year starting at age 23. Flexible sigmoidoscopy or colonoscopy. You may have a sigmoidoscopy every 5 years or a colonoscopy every 10 years starting at age 55. Prostate cancer screening. Recommendations will vary depending on your family history and other risks. Hepatitis C blood test. Hepatitis B blood test. Sexually transmitted disease (STD) testing. Diabetes screening. This is done by checking your blood sugar (glucose) after you have not eaten for a while (fasting). You may have this done every 1-3 years. Abdominal aortic aneurysm (AAA) screening. You may need this if you are a current or former smoker. Osteoporosis. You may be screened starting at age 20  if you are at high risk. Talk with your health care provider about your test results, treatment options, and if necessary, the need for more tests. Vaccines  Your health care provider may recommend certain vaccines, such as: Influenza vaccine. This is recommended every year. Tetanus, diphtheria, and acellular pertussis (Tdap, Td) vaccine. You may need a Td booster every 10  years. Zoster vaccine. You may need this after age 69. Pneumococcal 13-valent conjugate (PCV13) vaccine. One dose is recommended after age 20. Pneumococcal polysaccharide (PPSV23) vaccine. One dose is recommended after age 62. Talk to your health care provider about which screenings and vaccines you need and how often you need them. This information is not intended to replace advice given to you by your health care provider. Make sure you discuss any questions you have with your health care provider. Document Released: 06/27/2015 Document Revised: 02/18/2016 Document Reviewed: 04/01/2015 Elsevier Interactive Patient Education  2017 [place] Prevention in the Home Falls can cause injuries. They can happen to people of all ages. There are many things you can do to make your home safe and to help prevent falls. What can I do on the outside of my home? Regularly fix the edges of walkways and driveways and fix any cracks. Remove anything that might make you trip as you walk through a door, such as a raised step or threshold. Trim any bushes or trees on the path to your home. Use bright outdoor lighting. Clear any walking paths of anything that might make someone trip, such as rocks or tools. Regularly check to see if handrails are loose or broken. Make sure that both sides of any steps have handrails. Any raised decks and porches should have guardrails on the edges. Have any leaves, snow, or ice cleared regularly. Use sand or salt on walking paths during winter. Clean up any spills in your garage right away. This includes oil or grease spills. What can I do in the bathroom? Use night lights. Install grab bars by the toilet and in the tub and shower. Do not use towel bars as grab bars. Use non-skid mats or decals in the tub or shower. If you need to sit down in the shower, use a plastic, non-slip stool. Keep the floor dry. Clean up any water that spills on the floor as soon as it  happens. Remove soap buildup in the tub or shower regularly. Attach bath mats securely with double-sided non-slip rug tape. Do not have throw rugs and other things on the floor that can make you trip. What can I do in the bedroom? Use night lights. Make sure that you have a light by your bed that is easy to reach. Do not use any sheets or blankets that are too big for your bed. They should not hang down onto the floor. Have a firm chair that has side arms. You can use this for support while you get dressed. Do not have throw rugs and other things on the floor that can make you trip. What can I do in the kitchen? Clean up any spills right away. Avoid walking on wet floors. Keep items that you use a lot in easy-to-reach places. If you need to reach something above you, use a strong step stool that has a grab bar. Keep electrical cords out of the way. Do not use floor polish or wax that makes floors slippery. If you must use wax, use non-skid floor wax. Do not have throw rugs and other things  on the floor that can make you trip. What can I do with my stairs? Do not leave any items on the stairs. Make sure that there are handrails on both sides of the stairs and use them. Fix handrails that are broken or loose. Make sure that handrails are as long as the stairways. Check any carpeting to make sure that it is firmly attached to the stairs. Fix any carpet that is loose or worn. Avoid having throw rugs at the top or bottom of the stairs. If you do have throw rugs, attach them to the floor with carpet tape. Make sure that you have a light switch at the top of the stairs and the bottom of the stairs. If you do not have them, ask someone to add them for you. What else can I do to help prevent falls? Wear shoes that: Do not have high heels. Have rubber bottoms. Are comfortable and fit you well. Are closed at the toe. Do not wear sandals. If you use a stepladder: Make sure that it is fully opened.  Do not climb a closed stepladder. Make sure that both sides of the stepladder are locked into place. Ask someone to hold it for you, if possible. Clearly mark and make sure that you can see: Any grab bars or handrails. First and last steps. Where the edge of each step is. Use tools that help you move around (mobility aids) if they are needed. These include: Canes. Walkers. Scooters. Crutches. Turn on the lights when you go into a dark area. Replace any light bulbs as soon as they burn out. Set up your furniture so you have a clear path. Avoid moving your furniture around. If any of your floors are uneven, fix them. If there are any pets around you, be aware of where they are. Review your medicines with your doctor. Some medicines can make you feel dizzy. This can increase your chance of falling. Ask your doctor what other things that you can do to help prevent falls. This information is not intended to replace advice given to you by your health care provider. Make sure you discuss any questions you have with your health care provider. Document Released: 03/27/2009 Document Revised: 11/06/2015 Document Reviewed: 07/05/2014 Elsevier Interactive Patient Education  2017 [employer].

## 2022-10-05 ENCOUNTER — Ambulatory Visit: Payer: Medicare Other | Admitting: Physician Assistant

## 2022-10-05 ENCOUNTER — Encounter: Payer: Self-pay | Admitting: Physician Assistant

## 2022-10-05 VITALS — BP 146/76 | HR 60 | Resp 20 | Ht 72.0 in | Wt 121.0 lb

## 2022-10-05 DIAGNOSIS — G309 Alzheimer's disease, unspecified: Secondary | ICD-10-CM | POA: Diagnosis not present

## 2022-10-05 DIAGNOSIS — F0280 Dementia in other diseases classified elsewhere without behavioral disturbance: Secondary | ICD-10-CM

## 2022-10-05 NOTE — Patient Instructions (Signed)
It was a pleasure to see you today at our office.   Recommendations:  Follow up in  6 months  Continue rivastigmine 1.5 mg  2 times a day  Continue Seroquel    Whom to call:  Memory  decline, memory medications: Call our office [phone]   For psychiatric meds, mood meds: Please have your primary care physician manage these medications.   Counseling regarding caregiver distress, including caregiver depression, anxiety and issues regarding community resources, adult day care programs, adult living facilities, or memory care questions:      For assessment of decision of mental capacity and competency:  Call Dr. Erick Blinks, geriatric psychiatrist at [phone]  For guidance in geriatric dementia issues please call [facility] [phone]   If you have any severe symptoms of a stroke, or other severe issues such as confusion,severe chills or fever, etc call 911 or go to the ER as you may need to be evaluated further        RECOMMENDATIONS FOR ALL PATIENTS WITH MEMORY PROBLEMS: 1. Continue to exercise (Recommend 30 minutes of walking everyday, or 3 hours every week) 2. Increase social interactions - continue going to [place] and enjoy social gatherings with friends and family 3. Eat healthy, avoid fried foods and eat more fruits and vegetables 4. Maintain adequate blood pressure, blood sugar, and blood cholesterol level. Reducing the risk of stroke and cardiovascular disease also helps promoting better memory. 5. Avoid stressful situations. Live a simple life and avoid aggravations. Organize your time and prepare for the next day in anticipation. 6. Sleep well, avoid any interruptions of sleep and avoid any distractions in the bedroom that may interfere with adequate sleep quality 7. Avoid sugar, avoid sweets as there is a strong link between excessive sugar intake, diabetes, and cognitive impairment We discussed the Mediterranean diet, which has been shown to  help patients reduce the risk of progressive memory disorders and reduces cardiovascular risk. This includes eating fish, eat fruits and green leafy vegetables, nuts like almonds and hazelnuts, walnuts, and also use olive oil. Avoid fast foods and fried foods as much as possible. Avoid sweets and sugar as sugar use has been linked to worsening of memory function.  There is always a concern of gradual progression of memory problems. If this is the case, then we may need to adjust level of care according to patient needs. Support, both to the patient and caregiver, should then be put into place.    The Alzheimer's Association is here all day, every day for people facing Alzheimer's disease through our free 24/7 Helpline: [phone]. The Helpline provides reliable information and support to all those who need assistance, such as individuals living with memory loss, Alzheimer's or other dementia, caregivers, health care professionals and the public.  Our highly trained and knowledgeable staff can help you with: Understanding memory loss, dementia and Alzheimer's  Medications and other treatment options  General information about aging and brain health  Skills to provide quality care and to find the best care from professionals  Legal, financial and living-arrangement decisions Our Helpline also features: Confidential care consultation provided by master's level clinicians who can help with decision-making support, crisis assistance and education on issues families face every day  Help in a caller's preferred language using our translation service that features more than 200 languages and dialects  Referrals to local community programs, services and ongoing support     FALL PRECAUTIONS: Be cautious when walking. Scan the area for  obstacles that may increase the risk of trips and falls. When getting up in the mornings, sit up at the edge of the bed for a few minutes before getting out of bed.  Consider elevating the bed at the head end to avoid drop of blood pressure when getting up. Walk always in a well-lit room (use night lights in the walls). Avoid area rugs or power cords from appliances in the middle of the walkways. Use a walker or a cane if necessary and consider physical therapy for balance exercise. Get your eyesight checked regularly.  FINANCIAL OVERSIGHT: Supervision, especially oversight when making financial decisions or transactions is also recommended.  HOME SAFETY: Consider the safety of the kitchen when operating appliances like stoves, microwave oven, and blender. Consider having supervision and share cooking responsibilities until no longer able to participate in those. Accidents with firearms and other hazards in the house should be identified and addressed as well.   ABILITY TO BE LEFT ALONE: If patient is unable to contact 911 operator, consider using LifeLine, or when the need is there, arrange for someone to stay with patients. Smoking is a fire hazard, consider supervision or cessation. Risk of wandering should be assessed by caregiver and if detected at any point, supervision and safe proof recommendations should be instituted.  MEDICATION SUPERVISION: Inability to self-administer medication needs to be constantly addressed. Implement a mechanism to ensure safe administration of the medications.   DRIVING: Regarding driving, in patients with progressive memory problems, driving will be impaired. We advise to have someone else do the driving if trouble finding directions or if minor accidents are reported. Independent driving assessment is available to determine safety of driving.   If you are interested in the driving assessment, you can contact the following:  [employer] in [place] [phone]  [facility] [phone]  [facility] [phone] or [phone]       Mediterranean Diet A Mediterranean diet refers to food and lifestyle choices that are based on the traditions of countries located on the [employer]. This way of eating has been shown to help prevent certain conditions and improve outcomes for people who have chronic diseases, like kidney disease and heart disease. What are tips for following this plan? Lifestyle  Cook and eat meals together with your family, when possible. Drink enough fluid to keep your urine clear or pale yellow. Be physically active every day. This includes: Aerobic exercise like running or swimming. Leisure activities like gardening, walking, or housework. Get 7-8 hours of sleep each night. If recommended by your health care provider, drink red wine in moderation. This means 1 glass a day for nonpregnant women and 2 glasses a day for men. A glass of wine equals 5 oz (150 mL). Reading food labels  Check the serving size of packaged foods. For foods such as rice and pasta, the serving size refers to the amount of cooked product, not dry. Check the total fat in packaged foods. Avoid foods that have saturated fat or trans fats. Check the ingredients list for added sugars, such as corn syrup. Shopping  At the grocery store, buy most of your food from the areas near the walls of the store. This includes: Fresh fruits and vegetables (produce). Grains, beans, nuts, and seeds. Some of these may be available in unpackaged forms or large amounts (in bulk). Fresh seafood. Poultry and eggs. Low-fat dairy products. Buy whole ingredients instead of prepackaged foods. Buy fresh fruits  and vegetables in-season from local farmers markets. Buy frozen fruits and vegetables in resealable bags. If you do not have access to quality fresh seafood, buy precooked frozen shrimp or canned fish, such as tuna, salmon, or sardines. Buy small amounts of raw or cooked vegetables, salads, or olives from the deli or salad bar at your  store. Stock your pantry so you always have certain foods on hand, such as olive oil, canned tuna, canned tomatoes, rice, pasta, and beans. Cooking  Cook foods with extra-virgin olive oil instead of using butter or other vegetable oils. Have meat as a side dish, and have vegetables or grains as your main dish. This means having meat in small portions or adding small amounts of meat to foods like pasta or stew. Use beans or vegetables instead of meat in common dishes like chili or lasagna. Experiment with different cooking methods. Try roasting or broiling vegetables instead of steaming or sauteing them. Add frozen vegetables to soups, stews, pasta, or rice. Add nuts or seeds for added healthy fat at each meal. You can add these to yogurt, salads, or vegetable dishes. Marinate fish or vegetables using olive oil, lemon juice, garlic, and fresh herbs. Meal planning  Plan to eat 1 vegetarian meal one day each week. Try to work up to 2 vegetarian meals, if possible. Eat seafood 2 or more times a week. Have healthy snacks readily available, such as: Vegetable sticks with hummus. Greek yogurt. Fruit and nut trail mix. Eat balanced meals throughout the week. This includes: Fruit: 2-3 servings a day Vegetables: 4-5 servings a day Low-fat dairy: 2 servings a day Fish, poultry, or lean meat: 1 serving a day Beans and legumes: 2 or more servings a week Nuts and seeds: 1-2 servings a day Whole grains: 6-8 servings a day Extra-virgin olive oil: 3-4 servings a day Limit red meat and sweets to only a few servings a month What are my food choices? Mediterranean diet Recommended Grains: Whole-grain pasta. Seals rice. Bulgar wheat. Polenta. Couscous. Whole-wheat bread. [place]. Vegetables: Artichokes. Beets. Broccoli. Cabbage. Carrots. Eggplant. Green beans. Chard. Kale. Spinach. Onions. Leeks. Peas. Squash. Tomatoes. Peppers. Radishes. Fruits: Apples. Apricots. Avocado. Berries. Bananas.  Cherries. Dates. Figs. Grapes. Lemons. Melon. Oranges. Peaches. Plums. Pomegranate. Meats and other protein foods: Beans. Almonds. Sunflower seeds. Pine nuts. Peanuts. [place]. Salmon. Scallops. Shrimp. [place]. Tilapia. Clams. Oysters. Eggs. Dairy: Low-fat milk. Cheese. Greek yogurt. Beverages: Water. Red wine. Herbal tea. Fats and oils: Extra virgin olive oil. Avocado oil. Grape seed oil. Sweets and desserts: [place] yogurt with honey. Baked apples. Poached pears. Trail mix. Seasoning and other foods: Basil. Cilantro. Coriander. Cumin. Mint. Parsley. Sage. Rosemary. Tarragon. Garlic. Oregano. Thyme. Pepper. Balsalmic vinegar. Tahini. Hummus. Tomato sauce. Olives. Mushrooms. Limit these Grains: Prepackaged pasta or rice dishes. Prepackaged cereal with added sugar. Vegetables: Deep fried potatoes (french fries). Fruits: Fruit canned in syrup. Meats and other protein foods: Beef. Pork. Lamb. Poultry with skin. Hot dogs. Berniece Salines. Dairy: Ice cream. Sour cream. Whole milk. Beverages: Juice. Sugar-sweetened soft drinks. Beer. Liquor and spirits. Fats and oils: Butter. Canola oil. Vegetable oil. Beef fat (tallow). Lard. Sweets and desserts: Cookies. Cakes. Pies. Candy. Seasoning and other foods: Mayonnaise. Premade sauces and marinades. The items listed may not be a complete list. Talk with your dietitian about what dietary choices are right for you. Summary The Mediterranean diet includes both food and lifestyle choices. Eat a variety of fresh fruits and vegetables, beans, nuts, seeds, and whole grains. Limit the amount of red  meat and sweets that you eat. Talk with your health care provider about whether it is safe for you to drink red wine in moderation. This means 1 glass a day for nonpregnant women and 2 glasses a day for men. A glass of wine equals 5 oz (150 mL). This information is not intended to replace advice given to you by your health care provider. Make sure you discuss any questions you have with  your health care provider. Document Released: 01/22/2016 Document Revised: 02/24/2016 Document Reviewed: 01/22/2016 Elsevier Interactive Patient Education  2017 [employer].

## 2022-10-05 NOTE — Progress Notes (Signed)
Assessment/Plan:   Dementia likely due to Alzheimer's Disease with behavioral disturbance   Hunter Boone is a very pleasant 77 y.o. with  a history of hypertension, hyperlipidemia, anxiety, depression, aortic atherosclerosis, COPD, PTSD , tobacco habituation, history of lung nodule, diverticulosis, prediabetes, macular degeneration and a history of moderate dementia likely due to Alzheimer's disease with some vascular component seen today in follow up for memory loss.  Most recent MRI of the brain reviewed by me on 10/07/2021 shows generalized cortical atrophy, progressed since 2020, and mild to moderate periventricular chronic small vessel ischemia for age.  Patient had tried memantine 5 mg twice daily, donepezil,  by PCP, but was unable to tolerate it. He is on low dose rivastigmine 1.5 mg bid, tolerating with occasional bout of diarrhea, which hinder increasing the dose.  Progression of the disease is noted. He is now at [facility] memory care and is enjoying the activities there.      Follow up in 6  months. Recommend good control of her cardiovascular risk factors Continue to control mood as per PCP Continue rivastigmine 1.5 mg bid, if emory worsen, will consider discontinuing the medication as the risks outweigh the benefits  Recommend caregiver support group for wife to help her deal with caregiver distress.  Agree with memory care facility [facility]) for social and cognitive stimulation      Subjective:    This patient is accompanied in the office by his wife and his caregiver at [facility]  who supplements the history.  Previous records as well as any outside records available were reviewed prior to todays visit. Patient was last seen on 12/31/21   Any changes in memory since last visit? Memory is worse. " Difficulty following conversations". Since his last visit, he is now at [facility], enjoys a the activities and social life there. Sometimes he does not recognize his  wife. repeats oneself?  Endorsed Disoriented when walking into a room?   Denies. Has adjusted well to the memory care facility. Leaving objects in unusual places? Denies   Wandering behavior? Area is locked down. At night, wife reports that may have wandering , roaming, saying to other residents "that everybody is ok."  Any personality changes since last visit?  denies  . He is taking quetiapine 100 mg at night   Any worsening depression?:  denies   Hallucinations or paranoia?  denies   Seizures?    denies    Any sleep changes?  Unclear . Denies vivid dreams, REM behavior or sleepwalking   Sleep apnea?   denies   Any hygiene concerns?    Denies. "They take good care of him" Independent of bathing and dressing?  "He is supervised by the staff".   Does the patient needs help with medications? Facility is in charge   Who is in charge of the finances?  Wife  is in charge     Any changes in appetite? Eats well     Patient have trouble swallowing?  denies   Does the patient cook?  No   Any headaches?   denies   Chronic back pain  denies   Ambulates with difficulty? "He is a great walker"  Recent falls or head injuries? denies     Unilateral weakness, numbness or tingling? denies   Any tremors?  denies   Any anosmia?  Patient denies   Any incontinence of urine? Endorsed, uses Depends  Any bowel dysfunction?   denies      Patient  lives  [facility] memory [facility] since 07/30/22 and he enjoys the facility   Does the patient drive?No longer drives    History of Present Illness: This is a pleasant 77 year old right-handed man with a history of hypertension, macular degeneration, prior fall with traumatic pneumothorax, recurrent falls in the past, presenting for evaluation of memory loss. He feels his memory is a "7 or 8 out of 10." He started noticing changes several years ago, forgetting dates more than anything else. He lives with his wife Hunter Boone who is present to provide additional information. She  started noticing short-term memory changes a few years ago where he would ask the same questions. He however remained pretty good with keeping track of finances and managing his own medications. She denied any driving concerns, he denies getting lost driving. Concern regarding his cognitive status occurred due to significant delirium during his hospitalization last 12/17/2018. He fell on his left side while cleaning out a storage space and sustained a left closed femoral neck fracture. There was no loss of consciousness, he was brought by EMS to the ER on the evening of 7/5. Records were reviewed. It appears he received fentanyl that evening. He was scheduled for surgery the next day but on 7/6 he became confused and refused surgery. Concern was raised for possible alcohol use and risk of withdrawals, he had mentioned having a few drinks at night, his wife denied significant alcohol use. He was oriented only to self and was unable to give consent for surgery, combative, removing his IV. He kept saying he was having a haircut and wanted to leave. His wife and son tried to convince him on the phone but he was adamant and wanted to have another opinion.  He became very agitated in the evening and needed a sitter. He had a head CT with no acute changes, mild diffuse atrophy and chronic microvascular disease. Bloodwork unremarkable. On 7/7, his wife came to the hospital and tried to convince him, surgery was done that day, it was noted he was calm with no recollection of events from the day prior. He remained confused the day after, thinking he was in [place], wanting to go out and smoke a cigarette, and was given IV Haldol. He was evaluated by Speech Therapy with a MOCA score of 15/30. He was discharged home the day after surgery with home PT. He was calm once he got home. No further confusion since then. His wife reports that he is "the most calm man ever" and works as a Chartered loss adjuster. No prior similar  confusional episodes with surgeries in the past. No paranoia or hallucinations. His wife called our office to report that he had a Cognitive and Communications Exam at [facility] (report unavailable for review). Per wife, results showed RAW 78, Oral Expression 89, Orientation 83, Speech Comprehension 92, Reading Comprehension 100, Writing 100, Attention 88, Problem Solving 96, Memory 37. All our of 100 range.    He has a strong family history of dementia in both parents and his sister. 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 He drinks a couple of ounces a day of bourbon for several years. Mood and sleep are good. He denies any headaches, dizziness, diplopia, dysarthria, dysphagia, neck/back pain, focal numbness/tingling/weakness, bowel/bladder dysfunction. No anosmia, tremors, no further falls. His hip is not bothering him.   PREVIOUS MEDICATIONS: donepezil, memantine  CURRENT MEDICATIONS:  Outpatient Encounter Medications as of 10/05/2022  Medication  Sig   acetaminophen (TYLENOL) 325 MG tablet Take 2 tablets (650 mg total) by mouth every 6 (six) hours as needed.   escitalopram (LEXAPRO) 10 MG tablet Take 1 tablet (10 mg total) by mouth daily.   fluticasone (FLONASE) 50 MCG/ACT nasal spray USE 2 SPRAYS IN BOTH  NOSTRILS DAILY AS NEEDED  FOR ALLERGIES (Patient taking differently: Place 2 sprays into both nostrils as needed.)   furosemide (LASIX) 20 MG tablet Take 1 tablet (20 mg total) by mouth daily.   losartan (COZAAR) 100 MG tablet TAKE 1 TABLET BY MOUTH  DAILY   Multiple Vitamins-Minerals (CENTRUM PO) Take 1 tablet by mouth daily.   Multiple Vitamins-Minerals (PRESERVISION AREDS 2 PO) Take 1 capsule by mouth every morning.    nicotine (NICODERM CQ - DOSED IN MG/24 HOURS) 21 mg/24hr patch Place 1 patch (21 mg total) onto the skin daily.   QUEtiapine (SEROQUEL) 100 MG tablet Take 1 tablet (100 mg total) by mouth at bedtime.   rivastigmine (EXELON)  1.5 MG capsule Take 1 capsule at night for  1 week and then one capsule 2 times a day   rosuvastatin (CRESTOR) 5 MG tablet TAKE 1 TABLET BY MOUTH  DAILY   tamsulosin (FLOMAX) 0.4 MG CAPS capsule Take 1 capsule (0.4 mg total) by mouth daily.   vitamin C (ASCORBIC ACID) 500 MG tablet Take 500 mg by mouth daily.   No facility-administered encounter medications on file as of 10/05/2022.        No data to display            10/01/2021   10:00 AM 01/23/2019    8:00 AM  Montreal Cognitive Assessment   Visuospatial/ Executive (0/5) 1   Naming (0/3) 2   Attention: Read list of digits (0/2) 2 2  Attention: Read list of letters (0/1) 1 1  Attention: Serial 7 subtraction starting at 100 (0/3) 1 3  Language: Repeat phrase (0/2) 2 2  Language : Fluency (0/1) 1 0  Abstraction (0/2) 1 1  Delayed Recall (0/5) 0 5  Orientation (0/6) 1 6  Total 12   Adjusted Score (based on education) 12     Objective:     PHYSICAL EXAMINATION:    VITALS:   Vitals:   10/05/22 1255  Resp: 20  Height: 6' (1.829 m)    GEN:  The patient appears stated age and is in NAD. HEENT:  Normocephalic, atraumatic.   Neurological examination:  General: NAD, well-groomed, appears stated age. Orientation: The patient is alert. Oriented to person, not to place or date Cranial nerves: There is good facial symmetry.The speech is fluent but tangential, at times not clear. No aphasia or dysarthria. Fund of knowledge is reduced. Recent and remote memory are impaired. Attention and concentration are reduced.  Unable to name objects and repeat phrases.  Hearing is intact to conversational tone.   Sensation: Sensation is intact to light touch throughout Motor: Strength is at least antigravity x4. DTR's 2/4 in UE/LE     Movement examination: Tone: There is normal tone in the UE/LE Abnormal movements:  no tremor.  No myoclonus.  No asterixis.   Coordination:  There is decremation with RAM's. Abnormal finger to nose  Gait  and Station: The patient has no difficulty arising out of a deep-seated chair without the use of the hands. The patient's stride length is good.  Gait is cautious and narrow.    Thank you for allowing [place] the opportunity to participate in the care  of this nice patient. Please do not hesitate to contact us for any questions or concerns.   Total time spent on today's visit was 35 minutes dedicated to this patient today, preparing to see patient, examining the patient, ordering tests and/or medications and counseling the patient, documenting clinical information in the EHR or other health record, independently interpreting results and communicating results to the patient/family, discussing treatment and goals, answering patient's questions and coordinating care.  Cc:  Pincus Sanes, MD  Marlowe Kays 10/05/2022 1:05 PM

## 2022-10-06 ENCOUNTER — Telehealth: Payer: Self-pay | Admitting: Physician Assistant

## 2022-10-06 NOTE — Telephone Encounter (Signed)
Left detailed message at 10/06/2022

## 2022-10-06 NOTE — Telephone Encounter (Signed)
Pt's wife called in wanting to find out if the pt really needs to be taking the rivastigmine? She is questioning if it's necessary and is it helping him? She is getting confused from what she was told yesterday in the visit and would like clarification.

## 2022-10-14 ENCOUNTER — Encounter: Payer: Self-pay | Admitting: Internal Medicine

## 2022-10-15 ENCOUNTER — Other Ambulatory Visit: Payer: Self-pay

## 2022-10-15 ENCOUNTER — Telehealth: Payer: Self-pay | Admitting: Internal Medicine

## 2022-10-15 DIAGNOSIS — R300 Dysuria: Secondary | ICD-10-CM

## 2022-10-15 NOTE — Telephone Encounter (Signed)
Okay to order urinalysis-should get culture as well.  Need to know more about his sundowning.  He is already on Seroquel at night and Lexapro during the day.  This is occurring daily and is it just in the evening?

## 2022-10-15 NOTE — Telephone Encounter (Signed)
Tiffany of [facility] in [place] called.  They are needing Hunter Boone prescribed something for sundowners or to help him relax.  Please call them at :  [phone]

## 2022-10-15 NOTE — Telephone Encounter (Signed)
Patient's wife called wanting to discuss the sundown issues. She said it's typically from 7 pm to 1 am 3-4 night a week. She would like a call back at [phone].

## 2022-10-15 NOTE — Telephone Encounter (Signed)
Tiffany from [facility] in [place] wanting to order a urinalysis as well. Fax Number [phone] Phone [phone]

## 2022-10-15 NOTE — Telephone Encounter (Signed)
Order faxed today and message left for Hunter Boone to return call to clinic regarding the sundowners.  If she calls back please transfer to me.

## 2022-10-17 NOTE — Telephone Encounter (Signed)
Ativan would not be a good idea.    We can try adding an additional 25 mg of seroquel in the evening to see if that helps and adjust as needed

## 2022-10-18 MED ORDER — QUETIAPINE FUMARATE 25 MG PO TABS: ORAL_TABLET | ORAL | 5 refills | Status: AC

## 2022-10-18 NOTE — Telephone Encounter (Signed)
Rx sent 

## 2022-10-18 NOTE — Addendum Note (Signed)
Addended by: Pincus Sanes on: 10/18/2022 03:04 PM   Modules accepted: Orders

## 2022-10-19 DIAGNOSIS — M79675 Pain in left toe(s): Secondary | ICD-10-CM | POA: Diagnosis not present

## 2022-10-19 DIAGNOSIS — M79674 Pain in right toe(s): Secondary | ICD-10-CM | POA: Diagnosis not present

## 2022-10-19 DIAGNOSIS — B351 Tinea unguium: Secondary | ICD-10-CM | POA: Diagnosis not present

## 2022-10-24 ENCOUNTER — Other Ambulatory Visit: Payer: Self-pay | Admitting: Internal Medicine

## 2022-11-04 ENCOUNTER — Telehealth: Payer: Self-pay | Admitting: Physician Assistant

## 2022-11-04 NOTE — Telephone Encounter (Signed)
Pt's wife called in stating that after speaking with her son they would like to just take the patient off of the rivastigmine. He is at [facility] in [place]. Marcelino Duster is the contact there. A fax or phone call to Marcelino Duster would need to be made to take him off of it.

## 2022-11-05 NOTE — Telephone Encounter (Signed)
F/u   Marcelino Duster from [facility] in [place] checking on the status of yesterday message.    rivastigmine (EXELON) 1.5 MG capsule   Fax [phone]

## 2022-11-05 NOTE — Telephone Encounter (Signed)
Awaiting sara to sign order will fax

## 2022-11-05 NOTE — Telephone Encounter (Signed)
Faxed to [facility] 

## 2022-12-06 ENCOUNTER — Ambulatory Visit: Payer: Medicare Other | Admitting: Internal Medicine

## 2022-12-23 ENCOUNTER — Encounter: Payer: Self-pay | Admitting: Physician Assistant

## 2022-12-24 ENCOUNTER — Encounter: Payer: Self-pay | Admitting: Physician Assistant

## 2023-01-04 DIAGNOSIS — B351 Tinea unguium: Secondary | ICD-10-CM | POA: Diagnosis not present

## 2023-01-04 DIAGNOSIS — M79675 Pain in left toe(s): Secondary | ICD-10-CM | POA: Diagnosis not present

## 2023-01-04 DIAGNOSIS — M79674 Pain in right toe(s): Secondary | ICD-10-CM | POA: Diagnosis not present

## 2023-01-12 ENCOUNTER — Encounter: Payer: Self-pay | Admitting: Family Medicine

## 2023-01-12 ENCOUNTER — Telehealth (INDEPENDENT_AMBULATORY_CARE_PROVIDER_SITE_OTHER): Payer: Medicare Other | Admitting: Family Medicine

## 2023-01-12 DIAGNOSIS — U071 COVID-19: Secondary | ICD-10-CM

## 2023-01-12 MED ORDER — NIRMATRELVIR/RITONAVIR (PAXLOVID)TABLET: 3.0000 | ORAL_TABLET | Freq: Two times a day (BID) | ORAL | 0 refills | Status: AC

## 2023-01-12 MED ORDER — GUAIFENESIN ER 600 MG PO TB12: 600.0000 mg | ORAL_TABLET | Freq: Two times a day (BID) | ORAL | 0 refills | Status: DC | PRN

## 2023-01-12 NOTE — Progress Notes (Signed)
Virtual Visit via Video Note  Experienced technical difficulties multiple times trying to connect through [employer] edge and telemedicine link. Restarted multiple times, then restarted my computer and Epic. I did advise patient's spouse of delay and difficulties. Link sent, then repeat link sent.  I connected with [name] on 01/12/23 at 5:19 PM by a video enabled telemedicine application and verified that I am speaking with the correct person using two identifiers.  Patient location: [facility], with spouse - consent obtained My location: office - [place].  At end of visit some difficulty with audio, switched to telephone for final few minutes of the visit.  Majority of visit over video.   I discussed the limitations, risks, security and privacy concerns of performing an evaluation and management service by telephone and the availability of in person appointments. I also discussed with the patient that there may be a patient responsible charge related to this service. The patient expressed understanding and agreed to proceed, consent obtained  Chief complaint:  Chief Complaint  Patient presents with   Covid Positive    At [facility] facility tested positive this morning, coughing, sinus drainage, fatigued    History of Present Illness: Hunter Boone is a 77 y.o. male  Covid 51 infection: Initial symptoms started with cold symptoms - started in past 2 days. Not sure. Positive covid test today.  Runny nose, cough,  No new confusion/disorientation.  No new chest pain or new dyspnea.  Drinking water.  No known fever.  No attempted treatments.   Living at [facility].  Most recent covid booster in 04/2022.   Last GFR: 84 on 03/17/22.  He is on Crestor - last dose this morning.     Immunization History  Administered Date(s) Administered   COVID-19, mRNA, vaccine(Comirnaty)12 years and older 04/14/2022   Fluad Quad(high  Dose 65+) 03/15/2019, 02/27/2020, 03/31/2021, 04/14/2022   Influenza Whole 03/14/2012   Influenza, High Dose Seasonal PF 04/17/2013, 03/12/2014, 04/15/2015, 03/09/2016, 03/28/2018, 04/05/2019   PFIZER(Purple Top)SARS-COV-2 Vaccination 08/04/2019, 08/28/2019, 04/12/2020   Pfizer Covid-19 Vaccine Bivalent Booster 56yrs & up 04/08/2021, 04/14/2022   Pneumococcal Conjugate-13 06/11/2016   Pneumococcal Polysaccharide-23 08/19/2010   Td 07/27/2004   Tdap 06/05/2012, 04/01/2022   Zoster Recombinant(Shingrix) 07/13/2018, 02/09/2019   Zoster, Live 01/23/2013      Patient Active Problem List   Diagnosis Date Noted   Diarrhea 12/04/2021   Low weight 03/31/2021   Bilateral femoral hernias s/p lap repair w mesh 03/12/2020 03/12/2020   Left inguinal hernia s/p lap repair w mesh 03/12/2020 12/25/2019   Aortic atherosclerosis (HCC) 08/26/2019   Dementia (HCC) 03/20/2019   Bilateral leg edema 07/29/2017   Prediabetes 02/01/2017   COPD (chronic obstructive pulmonary disease) with emphysema (HCC) 06/13/2016   Lung nodule 05/25/2016   Pityriasis rosea 08/22/2015   Nonspecific abnormal electrocardiogram (ECG) (EKG) 10/25/2012   BPH with urinary obstruction 07/11/2009   Diverticulosis of colon 04/10/2009   Tobacco dependence due to cigarettes 01/26/2008   Essential hypertension 01/26/2008   Past Medical History:  Diagnosis Date   Aortic atherosclerosis (HCC)    chronic--- followed by pcp   BPH without obstruction/lower urinary tract symptoms    urologist--- dr bell   COPD with emphysema (HCC)    followed by pcp---  never used oxygen,  still smokes 1 ppd   Diverticulosis of colon    Full dentures    Heart murmur    per pt's pcp note 02-27-2020 in epic , she  heard murmur   Hemorrhoids    History of adenomatous polyp of colon    History of concussion    per pt in [place] ,  no resiudal   History of elevated PSA    negative bx 2019   History of gastroesophageal reflux (GERD)    03-06-2020  per pt no issues for several yrs   History of pneumothorax 2017   traumatic due to fall   Hypertension    followed by pcp   Left inguinal hernia    Macular degeneration, left eye    Mild neurocognitive disorder    neruologist--- dr Karel Jarvis   Nodule of right lung    lower lobe--- followed by pcp--- last chest CT 05-07-2019 stable   Pre-diabetes    Wears glasses    Past Surgical History:  Procedure Laterality Date   COLONOSCOPY  last one 04-20-2016   EVALUATION UNDER ANESTHESIA WITH HEMORRHOIDECTOMY N/A 07/01/2017   Procedure: ANORECTAL EXAMINATION UNDER ANESTHESIA, HEMORRHOIDECTOMY, HEMORRHOIDAL LIGATION/PEXY;  Surgeon: Karie Soda, MD;  Location: [facility];  Service: General;  Laterality: N/A;  GENERAL AND LOCAL   INGUINAL HERNIA REPAIR N/A 03/12/2020   Procedure: LAPARSCOPIC LEFT INGUINAL HERNIA REPAIR; BILATERAL FEMORAL HERNIA REPAIR; BILATERAL TAP BLOCK;  Surgeon: Karie Soda, MD;  Location: [facility];  Service: General;  Laterality: N/A;   LAPAROSCOPIC ABDOMINAL EXPLORATION N/A 03/12/2020   Procedure: LAPAROSCOPIC  EXPLORATION AND REPAIRS OF HERNIAS FOUND;  Surgeon: Karie Soda, MD;  Location: [facility] ;  Service: General;  Laterality: N/A;   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   Procedure: TOTAL HIP ARTHROPLASTY ANTERIOR APPROACH;  Surgeon: Samson Frederic, MD;  Location: [facility];  Service: Orthopedics;  Laterality: Left;   Allergies  Allergen Reactions   Namenda [Memantine] Other (See Comments)    Increased confusion   Prior to Admission medications   Medication Sig Start Date End Date Taking? Authorizing Provider  acetaminophen (TYLENOL) 325 MG tablet Take 2 tablets (650 mg total) by mouth every 6 (six) hours as needed. 08/26/22  Yes Burns, Bobette Mo, MD  escitalopram (LEXAPRO) 10 MG tablet Take 1 tablet (10 mg total) by mouth daily. 06/26/22  Yes Burns, Bobette Mo, MD  fluticasone (FLONASE) 50 MCG/ACT nasal spray USE 2 SPRAYS IN BOTH  NOSTRILS DAILY  AS NEEDED  FOR ALLERGIES Patient taking differently: Place 2 sprays into both nostrils as needed. 12/28/19  Yes Burns, Bobette Mo, MD  furosemide (LASIX) 20 MG tablet Take 1 tablet (20 mg total) by mouth daily. 04/29/22  Yes Burns, Bobette Mo, MD  losartan (COZAAR) 100 MG tablet TAKE 1 TABLET BY MOUTH  DAILY 06/04/21  Yes Burns, Bobette Mo, MD  Multiple Vitamins-Minerals (CENTRUM PO) Take 1 tablet by mouth daily.   Yes [provider]  Multiple Vitamins-Minerals (PRESERVISION AREDS 2 PO) Take 1 capsule by mouth every morning.    Yes [provider]  nicotine (NICODERM CQ - DOSED IN MG/24 HOURS) 21 mg/24hr patch Place 1 patch (21 mg total) onto the skin daily. 07/20/22  Yes Burns, Bobette Mo, MD  QUEtiapine (SEROQUEL) 100 MG tablet Take 1 tablet (100 mg total) by mouth at bedtime. 07/16/22  Yes Pincus Sanes, MD  QUEtiapine (SEROQUEL) 25 MG tablet Take one tab daily at 4 pm 10/18/22  Yes Burns, Bobette Mo, MD  rivastigmine (EXELON) 1.5 MG capsule Take 1 capsule at night for  1 week and then one capsule 2 times a day 12/31/21  Yes Marcos Eke, PA-C  rosuvastatin (  CRESTOR) 5 MG tablet TAKE 1 TABLET BY MOUTH  DAILY 06/04/21  Yes Burns, Bobette Mo, MD  tamsulosin (FLOMAX) 0.4 MG CAPS capsule TAKE 1 CAPSULE BY MOUTH DAILY 10/25/22  Yes Burns, Bobette Mo, MD  vitamin C (ASCORBIC ACID) 500 MG tablet Take 500 mg by mouth daily.   Yes [provider]   Social History   Socioeconomic History   Marital status: Married    Spouse name: Not on file   Number of children: Not on file   Years of education: 14   Highest education level: Associate degree: academic program  Occupational History   Occupation: Optician, dispensing  Tobacco Use   Smoking status: Former    Current packs/day: 1.00    Average packs/day: 1 pack/day for 56.0 years (56.0 ttl pk-yrs)    Types: Cigarettes, Pipe   Smokeless tobacco: Never   Tobacco comments:    03-06-2020 per pt quit pipe approx. 02/ 2021,  smoked since age 60    09/07/2022  quit smoking in February 2024  Vaping Use   Vaping status: Never Used  Substance and Sexual Activity   Alcohol use: Not Currently   Drug use: Never   Sexual activity: Not Currently  Other Topics Concern   Not on file  Social History Narrative   Lives with wife in a split level home      Right handed      Highest Level of edu- some college   Social Determinants of Health   Financial Resource Strain: Low Risk  (09/07/2022)   Overall Financial Resource Strain (CARDIA)    Difficulty of Paying Living Expenses: Not hard at all  Food Insecurity: No Food Insecurity (09/07/2022)   Hunger Vital Sign    Worried About Running Out of Food in the Last Year: Never true    Ran Out of Food in the Last Year: Never true  Transportation Needs: No Transportation Needs (09/07/2022)   PRAPARE - Administrator, Civil Service (Medical): No    Lack of Transportation (Non-Medical): No  Physical Activity: Inactive (09/07/2022)   Exercise Vital Sign    Days of Exercise per Week: 0 days    Minutes of Exercise per Session: 0 min  Stress: No Stress Concern Present (09/07/2022)   [employer] of Occupational Health - Occupational Stress Questionnaire    Feeling of Stress : Not at all  Social Connections: Unknown (09/07/2022)   Social Connection and Isolation Panel [NHANES]    Frequency of Communication with Friends and Family: Patient declined    Frequency of Social Gatherings with Friends and Family: Patient declined    Attends Religious Services: Patient declined    Database administrator or Organizations: Not on file    Attends Banker Meetings: Patient declined    Marital Status: Married  Catering manager Violence: Not At Risk (09/07/2022)   Humiliation, Afraid, Rape, and Kick questionnaire    Fear of Current or Ex-Partner: No    Emotionally Abused: No    Physically Abused: No    Sexually Abused: No    Observations/Objective: There were no vitals filed for this  visit. Nontoxic appearance on video.  Nonlabored breathing.  No apparent distress.  Majority of history provided by spouse with some additional history provided by patient.  Assessment and Plan: COVID-19 virus infection - Plan: nirmatrelvir/ritonavir (PAXLOVID) 20 x 150 MG & 10 x 100MG  TABS, guaiFENesin (MUCINEX) 600 MG 12 hr tablet  -COVID-19 infection, unsure of exact day, but early  in course of illness, positive test today.  Denies acute changes of confusion or disorientation, chest pain or dyspnea.  Drinking fluids.  No attempted treatments at this time.  Mild symptoms as above, appears to be stable for treatment at his care facility at this time.  -Antiviral Paxlovid discussed including limitations, side effects, and interactions with other medications.  Given his COPD/lung disease it was decided to start that medication.  Full strength based on last GFR.  Will need to hold his statin for the next 1 week, plan to start antiviral in the morning since he took his statin today.  Additionally discussed interaction with his Seroquel and need to decrease dose during use of Paxlovid.  He is currently taking 25 mg during the afternoon and 100 mg at night.  For now we will decrease his dose to 50 mg at night with as needed 25 mg dose during the afternoon.  If worsening agitation could increase dose.  This can be discussed with his PCP as well as a verbal order may be needed at his care facility.   Will forward chart to Dr. Lawerance Bach.   -Symptomatic care with fluids, Mucinex, and ER precautions given.  Masking/isolation precautions discussed.  Understanding expressed. All questions were answered.  Follow Up Instructions:  As needed, ER precautions above.    I discussed the assessment and treatment plan with the patient. The patient was provided an opportunity to ask questions and all were answered. The patient agreed with the plan and demonstrated an understanding of the instructions.   The patient was advised  to call back or seek an in-person evaluation if the symptoms worsen or if the condition fails to improve as anticipated.   Shade Flood, MD

## 2023-01-14 ENCOUNTER — Telehealth: Payer: Self-pay | Admitting: Internal Medicine

## 2023-01-14 NOTE — Telephone Encounter (Signed)
[facility] [place] Night - C TELEPHONE ADVICE RECORD [facility] Patient Name First: Hunter Last: Boone Gender: Male DOB: [birth year] Age: 77 Y 5 M 17 D Return Phone Number: [phone] (Primary) [place] [address] Client [facility] [place] Night - C Client Site [facility] - Night Provider Meredith Staggers- MD Contact Type Call Who Is Calling Patient / Member / Family / Caregiver Call Type Triage / Clinical Caller Name Loyal Holzheimer Relationship To Patient Spouse Return Phone Number [phone] (Primary) Chief Complaint Cough Reason for Call Symptomatic / Request for Health Information Initial Comment Caller states her husband has COVID, cough, sore throat, dripping nose, Translation No Disp. Time Lamount Cohen Time) Disposition Final User 01/12/2023 5:06:59 PM Attempt made - message left Sandrea Hughs, RNMarcelino Duster 01/12/2023 5:26:58 PM Attempt made - message left Sandrea Hughs, RN, Marcelino Duster 01/12/2023 5:39:03 PM FINAL ATTEMPT MADE - message left Yes Sandrea Hughs, RN, Marcelino Duster Final Disposition 01/12/2023 5:39:03 PM FINAL ATTEMPT MADE - message left Yes Jeter, RN,   Pt had MyChart visit 01/12/2023 with Neva Seat

## 2023-01-15 DIAGNOSIS — R059 Cough, unspecified: Secondary | ICD-10-CM | POA: Diagnosis not present

## 2023-01-20 ENCOUNTER — Encounter: Payer: Self-pay | Admitting: Internal Medicine

## 2023-01-20 NOTE — Patient Instructions (Addendum)
      Blood work was ordered.   The lab is on the first floor.    Medications changes include :   none    Return in about 6 months (around 07/24/2023) for follow up.

## 2023-01-20 NOTE — Progress Notes (Signed)
Subjective:    Patient ID: Hunter Boone, male    DOB: 01/10/1946, 77 y.o.   MRN: [id number]     HPI Sarkis is here for follow up of his chronic medical problems.  He is here with his wife and caregiver from the nursing facility.  Recent covid - was on paxolid - had to hold seroquel.  Some difficulty directing when off of the seroquel.  Still not better yet.   Sleeps in recliner with feet down.    Mood ok.   Sleep is ok.   Medications and allergies reviewed with patient and updated if appropriate.  Current Outpatient Medications on File Prior to Visit  Medication Sig Dispense Refill   acetaminophen (TYLENOL) 325 MG tablet Take 2 tablets (650 mg total) by mouth every 6 (six) hours as needed. 90 tablet 3   escitalopram (LEXAPRO) 10 MG tablet Take 1 tablet (10 mg total) by mouth daily. 30 tablet 5   fluticasone (FLONASE) 50 MCG/ACT nasal spray USE 2 SPRAYS IN BOTH  NOSTRILS DAILY AS NEEDED  FOR ALLERGIES (Patient taking differently: Place 2 sprays into both nostrils as needed.) 48 g 1   furosemide (LASIX) 20 MG tablet Take 1 tablet (20 mg total) by mouth daily. 30 tablet 0   guaiFENesin (MUCINEX) 600 MG 12 hr tablet Take 1 tablet (600 mg total) by mouth 2 (two) times daily as needed. 30 tablet 0   losartan (COZAAR) 100 MG tablet TAKE 1 TABLET BY MOUTH  DAILY 90 tablet 3   Multiple Vitamins-Minerals (CENTRUM PO) Take 1 tablet by mouth daily.     Multiple Vitamins-Minerals (PRESERVISION AREDS 2 PO) Take 1 capsule by mouth every morning.      QUEtiapine (SEROQUEL) 100 MG tablet Take 1 tablet (100 mg total) by mouth at bedtime. 90 tablet 1   QUEtiapine (SEROQUEL) 25 MG tablet Take one tab daily at 4 pm 30 tablet 5   rivastigmine (EXELON) 1.5 MG capsule Take 1 capsule at night for  1 week and then one capsule 2 times a day 60 capsule 11   rosuvastatin (CRESTOR) 5 MG tablet TAKE 1 TABLET BY MOUTH  DAILY 90 tablet 3   tamsulosin (FLOMAX) 0.4 MG CAPS capsule TAKE 1 CAPSULE BY MOUTH  DAILY 100 capsule 2   vitamin C (ASCORBIC ACID) 500 MG tablet Take 500 mg by mouth daily.     nicotine (NICODERM CQ - DOSED IN MG/24 HOURS) 21 mg/24hr patch Place 1 patch (21 mg total) onto the skin daily. (Patient not taking: Reported on 01/21/2023) 28 patch 0   No current facility-administered medications on file prior to visit.     Review of Systems  Constitutional:  Negative for appetite change (good) and fever.  Respiratory:  Positive for shortness of breath (with exertion). Negative for cough and wheezing.   Cardiovascular:  Positive for leg swelling. Negative for chest pain and palpitations.  Neurological:  Negative for light-headedness and headaches.       Objective:   Vitals:   01/21/23 1339  BP: 128/74  Pulse: 72  Temp: 98.1 F (36.7 C)  SpO2: 93%   BP Readings from Last 3 Encounters:  01/21/23 128/74  10/05/22 (!) 146/76  04/06/22 114/80   Wt Readings from Last 3 Encounters:  01/21/23 142 lb 6.4 oz (64.6 kg)  10/05/22 121 lb (54.9 kg)  09/07/22 128 lb (58.1 kg)   Body mass index is 19.31 kg/m.    Physical Exam Constitutional:  General: He is not in acute distress.    Appearance: Normal appearance. He is not ill-appearing.  HENT:     Head: Normocephalic and atraumatic.  Eyes:     Conjunctiva/sclera: Conjunctivae normal.  Cardiovascular:     Rate and Rhythm: Normal rate and regular rhythm.     Heart sounds: Normal heart sounds.  Pulmonary:     Effort: Pulmonary effort is normal. No respiratory distress.     Breath sounds: Normal breath sounds. No wheezing or rales.  Musculoskeletal:     Right lower leg: No edema.     Left lower leg: No edema.  Skin:    General: Skin is warm and dry.     Findings: No rash.  Neurological:     Mental Status: He is alert. Mental status is at baseline.  Psychiatric:        Mood and Affect: Mood normal.        Lab Results  Component Value Date   WBC 8.7 03/17/2022   HGB 12.3 (L) 03/17/2022   HCT 36.7 (L)  03/17/2022   PLT 228.0 03/17/2022   GLUCOSE 86 03/17/2022   CHOL 172 09/30/2021   TRIG 95.0 09/30/2021   HDL 66.90 09/30/2021   LDLCALC 86 09/30/2021   ALT 27 03/17/2022   AST 25 03/17/2022   NA 134 (L) 03/17/2022   K 4.2 03/17/2022   CL 99 03/17/2022   CREATININE 0.85 03/17/2022   BUN 17 03/17/2022   CO2 29 03/17/2022   TSH 3.11 03/17/2022   PSA 9.52 (H) 08/16/2017   INR 1.01 05/24/2016   HGBA1C 5.7 09/30/2021     Assessment & Plan:    See Problem List for Assessment and Plan of chronic medical problems.

## 2023-01-21 ENCOUNTER — Ambulatory Visit (INDEPENDENT_AMBULATORY_CARE_PROVIDER_SITE_OTHER): Payer: Medicare Other | Admitting: Internal Medicine

## 2023-01-21 VITALS — BP 128/74 | HR 72 | Temp 98.1°F | Ht 72.0 in | Wt 142.4 lb

## 2023-01-21 DIAGNOSIS — N138 Other obstructive and reflux uropathy: Secondary | ICD-10-CM

## 2023-01-21 DIAGNOSIS — I10 Essential (primary) hypertension: Secondary | ICD-10-CM | POA: Diagnosis not present

## 2023-01-21 DIAGNOSIS — R7303 Prediabetes: Secondary | ICD-10-CM | POA: Diagnosis not present

## 2023-01-21 DIAGNOSIS — D649 Anemia, unspecified: Secondary | ICD-10-CM

## 2023-01-21 DIAGNOSIS — F03911 Unspecified dementia, unspecified severity, with agitation: Secondary | ICD-10-CM

## 2023-01-21 DIAGNOSIS — R600 Localized edema: Secondary | ICD-10-CM | POA: Diagnosis not present

## 2023-01-21 DIAGNOSIS — J439 Emphysema, unspecified: Secondary | ICD-10-CM

## 2023-01-21 DIAGNOSIS — G3184 Mild cognitive impairment, so stated: Secondary | ICD-10-CM

## 2023-01-21 DIAGNOSIS — N401 Enlarged prostate with lower urinary tract symptoms: Secondary | ICD-10-CM

## 2023-01-21 DIAGNOSIS — I700 Atherosclerosis of aorta: Secondary | ICD-10-CM

## 2023-01-21 LAB — CBC WITH DIFFERENTIAL/PLATELET
Basophils Absolute: 0 10*3/uL (ref 0.0–0.1)
Basophils Relative: 0.4 % (ref 0.0–3.0)
Eosinophils Absolute: 0.3 10*3/uL (ref 0.0–0.7)
Eosinophils Relative: 3 % (ref 0.0–5.0)
HCT: 37.1 % — ABNORMAL LOW (ref 39.0–52.0)
Hemoglobin: 11.8 g/dL — ABNORMAL LOW (ref 13.0–17.0)
Lymphocytes Relative: 20.9 % (ref 12.0–46.0)
Lymphs Abs: 2.2 10*3/uL (ref 0.7–4.0)
MCHC: 32 g/dL (ref 30.0–36.0)
MCV: 89 fl (ref 78.0–100.0)
Monocytes Absolute: 1.1 10*3/uL — ABNORMAL HIGH (ref 0.1–1.0)
Monocytes Relative: 10.6 % (ref 3.0–12.0)
Neutro Abs: 7 10*3/uL (ref 1.4–7.7)
Neutrophils Relative %: 65.1 % (ref 43.0–77.0)
Platelets: 349 10*3/uL (ref 150.0–400.0)
RBC: 4.16 Mil/uL — ABNORMAL LOW (ref 4.22–5.81)
RDW: 15.2 % (ref 11.5–15.5)
WBC: 10.7 10*3/uL — ABNORMAL HIGH (ref 4.0–10.5)

## 2023-01-21 LAB — COMPREHENSIVE METABOLIC PANEL
ALT: 52 U/L (ref 0–53)
AST: 41 U/L — ABNORMAL HIGH (ref 0–37)
Albumin: 3.7 g/dL (ref 3.5–5.2)
Alkaline Phosphatase: 90 U/L (ref 39–117)
BUN: 27 mg/dL — ABNORMAL HIGH (ref 6–23)
CO2: 29 mEq/L (ref 19–32)
Calcium: 9.4 mg/dL (ref 8.4–10.5)
Chloride: 101 mEq/L (ref 96–112)
Creatinine, Ser: 1.28 mg/dL (ref 0.40–1.50)
GFR: 53.99 mL/min — ABNORMAL LOW (ref >60.00)
Glucose, Bld: 104 mg/dL — ABNORMAL HIGH (ref 70–99)
Potassium: 3.5 mEq/L (ref 3.5–5.1)
Sodium: 138 mEq/L (ref 135–145)
Total Bilirubin: 0.4 mg/dL (ref 0.2–1.2)
Total Protein: 7.4 g/dL (ref 6.0–8.3)

## 2023-01-21 LAB — HEMOGLOBIN A1C: Hgb A1c MFr Bld: 6.4 % (ref 4.6–6.5)

## 2023-01-21 LAB — LIPID PANEL
Cholesterol: 95 mg/dL (ref 0–200)
HDL: 35.6 mg/dL — ABNORMAL LOW (ref >39.00)
LDL Cholesterol: 35 mg/dL (ref 0–99)
NonHDL: 59.53
Total CHOL/HDL Ratio: 3
Triglycerides: 121 mg/dL (ref 0.0–149.0)
VLDL: 24.2 mg/dL (ref 0.0–40.0)

## 2023-01-21 LAB — TSH: TSH: 1.6 u[IU]/mL (ref 0.35–5.50)

## 2023-01-21 NOTE — Assessment & Plan Note (Signed)
Chronic Continue rosuvastatin 5 mg daily

## 2023-01-21 NOTE — Assessment & Plan Note (Signed)
Subacute Likely related to him sleeping in the recliner with his legs down Wearing compression socks Continue Lasix 20 mg daily

## 2023-01-21 NOTE — Assessment & Plan Note (Signed)
Chronic Check a1c Low sugar / carb diet 

## 2023-01-21 NOTE — Assessment & Plan Note (Signed)
Chronic Continue Flomax 0.4 mg daily 

## 2023-01-21 NOTE — Assessment & Plan Note (Signed)
Chronic Following with neurology Just after COVID and Seroquel being stopped temporarily he has had a little bit more agitation and difficulty cooperating with commands-may just be from infection or stopping Seroquel temporarily-progression of dementia is also possible Currently on Exelon 1.5 mg twice daily Continue Seroquel 25 mg at 4 PM and Seroquel 100 mg at bedtime which seems to be working well

## 2023-01-21 NOTE — Assessment & Plan Note (Signed)
Chronic Blood pressure well controlled CMP, CBC Continue losartan 100 mg daily 

## 2023-01-21 NOTE — Assessment & Plan Note (Signed)
Chronic No longer smoking Does have some chronic shortness of breath Not currently on any inhalers

## 2023-01-22 NOTE — Addendum Note (Signed)
Addended by: Pincus Sanes on: 01/22/2023 04:58 PM   Modules accepted: Orders

## 2023-01-27 ENCOUNTER — Encounter: Payer: Self-pay | Admitting: Internal Medicine

## 2023-01-31 ENCOUNTER — Telehealth: Payer: Self-pay | Admitting: Internal Medicine

## 2023-01-31 NOTE — Telephone Encounter (Signed)
PT wife is calling today wanting to know results from a urine sample that was suppose to be drop off August 12th. Please advise.

## 2023-02-01 NOTE — Telephone Encounter (Signed)
Spoke with wife today and questions answered.

## 2023-02-15 ENCOUNTER — Ambulatory Visit (INDEPENDENT_AMBULATORY_CARE_PROVIDER_SITE_OTHER): Payer: Medicare Other

## 2023-02-15 DIAGNOSIS — D649 Anemia, unspecified: Secondary | ICD-10-CM | POA: Diagnosis not present

## 2023-02-15 LAB — HEMOCCULT SLIDES (X 3 CARDS)
Fecal Occult Blood: NEGATIVE
OCCULT 1: NEGATIVE
OCCULT 2: NEGATIVE
OCCULT 3: NEGATIVE
OCCULT 4: NEGATIVE
OCCULT 5: NEGATIVE

## 2023-03-22 DIAGNOSIS — M79675 Pain in left toe(s): Secondary | ICD-10-CM | POA: Diagnosis not present

## 2023-03-22 DIAGNOSIS — M79674 Pain in right toe(s): Secondary | ICD-10-CM | POA: Diagnosis not present

## 2023-03-22 DIAGNOSIS — B351 Tinea unguium: Secondary | ICD-10-CM | POA: Diagnosis not present

## 2023-04-06 ENCOUNTER — Telehealth (INDEPENDENT_AMBULATORY_CARE_PROVIDER_SITE_OTHER): Payer: Medicare Other | Admitting: Physician Assistant

## 2023-04-06 DIAGNOSIS — G309 Alzheimer's disease, unspecified: Secondary | ICD-10-CM

## 2023-04-06 DIAGNOSIS — F0280 Dementia in other diseases classified elsewhere without behavioral disturbance: Secondary | ICD-10-CM

## 2023-04-06 DIAGNOSIS — F02818 Dementia in other diseases classified elsewhere, unspecified severity, with other behavioral disturbance: Secondary | ICD-10-CM

## 2023-04-06 NOTE — Progress Notes (Signed)
Virtual Visit via Video Note The purpose of this virtual visit is to provide medical care in a patient that is unable to be seen in person due to physical or health limitations   Consent was obtained for video visit:  yes  Answered questions that patient had about telehealth interaction:  yes I discussed the limitations, risks, security and privacy concerns of performing an evaluation and management service by telemedicine. I also discussed with the patient that there may be a patient responsible charge related to this service. The patient expressed understanding and agreed to proceed.  Pt location: Home Physician Location: office Name of referring provider:  Pincus Sanes, MD I connected with Hunter Boone at patients initiation/request on 04/06/2023 at  1:00 PM EDT by video enabled telemedicine application and verified that I am speaking with the correct person using two identifiers. [name]:  [id number] Pt DOB:  17-Sep-1945 Video Participants:  Hunter Boone;  caregiver     Assessment and Plan:      Dementia due to Alzheimer's disease with behavioral disturbance   Hunter Boone is a very pleasant 77 y.o. RH male with a history of hypertension, hyperlipidemia, anxiety, depression, aortic atherosclerosis, COPD, PTSD , tobacco habituation, history of lung nodule, diverticulosis, prediabetes, macular degeneration and a history of moderate dementia likely due to Alzheimer's disease with some vascular component seen today in follow up for memory loss.  He was on rivastigmine, but wife concluded that this is not therapeutic given the progression of the disease, therefore no longer indicated. Patient is able to participate on his ADLs with assistance. Mood is controlled with Seroquel   Follow up in  6 months. Recommend good control of her cardiovascular risk factors Continue to control mood as per PCP. Patient is on Seroquel  Agree with memory care for social and cognitive  stimulation   History of Present Illness:     Any changes in memory since last visit? "It is stable but at times he stares as he is trying to comprehend,"  He has difficulty following conversations.  He enjoys the activities at [facility], which provides social and cognitive stimulation.  Sometimes he does not recognize his wife. repeats oneself? Less verbal, he may be tangential more frequently  Disoriented when walking into a room?  Patient denies .   Leaving objects in unusual places?  May misplace things but not in unusual places   Wandering behavior?  Walks throughout th unit going to the residents "are you ok". Any personality changes since last visit?  Behavior is stable.  Any worsening depression?:  Denies.   Hallucinations or paranoia?  Denies.   Seizures? denies    Any sleep changes? He sleeps more times in the bed instead on the recliner. It is hard for him to understand when bedtime is . Denies vivid dreams, REM behavior or sleepwalking   Sleep apnea?   Denies.   Any hygiene concerns?   "They take care of him well ". Independent of bathing and dressing?  "He is well supervised by the staff " Does the patient needs help with medications?  Facility is in charge   Who is in charge of the finances?  Wife is in charge     Any changes in appetite?  "He eats well "    Patient have trouble swallowing? Denies.   Does the patient cook? No Any headaches?   Denies.   Chronic back pain  denies.   Ambulates with difficulty?  Denies.  He walks frequently "he gets plenty of exercise".  Recent falls or head injuries? denies     Unilateral weakness, numbness or tingling? denies   Any tremors?  Denies   Sleeps  in the recliner the head is turned int Any anosmia?  Denies   Any incontinence of urine?  Endorsed, wears diapers Any bowel dysfunction?  Occasional diarrhea     Patient lives at [facility] Does the patient drive? No longer drives     History of Present Illness: This is a  pleasant 77 year old right-handed man with a history of hypertension, macular degeneration, prior fall with traumatic pneumothorax, recurrent falls in the past, presenting for evaluation of memory loss. He feels his memory is a "7 or 8 out of 10." He started noticing changes several years ago, forgetting dates more than anything else. He lives with his wife Hunter Boone who is present to provide additional information. She started noticing short-term memory changes a few years ago where he would ask the same questions. He however remained pretty good with keeping track of finances and managing his own medications. She denied any driving concerns, he denies getting lost driving. Concern regarding his cognitive status occurred due to significant delirium during his hospitalization last 12/17/2018. He fell on his left side while cleaning out a storage space and sustained a left closed femoral neck fracture. There was no loss of consciousness, he was brought by EMS to the ER on the evening of 7/5. Records were reviewed. It appears he received fentanyl that evening. He was scheduled for surgery the next day but on 7/6 he became confused and refused surgery. Concern was raised for possible alcohol use and risk of withdrawals, he had mentioned having a few drinks at night, his wife denied significant alcohol use. He was oriented only to self and was unable to give consent for surgery, combative, removing his IV. He kept saying he was having a haircut and wanted to leave. His wife and son tried to convince him on the phone but he was adamant and wanted to have another opinion.  He became very agitated in the evening and needed a sitter. He had a head CT with no acute changes, mild diffuse atrophy and chronic microvascular disease. Bloodwork unremarkable. On 7/7, his wife came to the hospital and tried to convince him, surgery was done that day, it was noted he was calm with no recollection of events from the day prior. He remained  confused the day after, thinking he was in [place], wanting to go out and smoke a cigarette, and was given IV Haldol. He was evaluated by Speech Therapy with a MOCA score of 15/30. He was discharged home the day after surgery with home PT. He was calm once he got home. No further confusion since then. His wife reports that he is "the most calm man ever" and works as a Chartered loss adjuster. No prior similar confusional episodes with surgeries in the past. No paranoia or hallucinations. His wife called our office to report that he had a Cognitive and Communications Exam at [facility] (report unavailable for review). Per wife, results showed RAW 78, Oral Expression 89, Orientation 83, Speech Comprehension 92, Reading Comprehension 100, Writing 100, Attention 88, Problem Solving 96, Memory 37. All our of 100 range.    He has a strong family history of dementia in both parents and his sister. 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 He drinks a couple of ounces a day of bourbon for several years. Mood and sleep are good. He denies any headaches, dizziness, diplopia, dysarthria, dysphagia, neck/back pain, focal numbness/tingling/weakness, bowel/bladder dysfunction. No anosmia, tremors, no further falls. His hip is not bothering him.   PREVIOUS MEDICATIONS: donepezil, memantine, rivastigmine Unilateral weakness? Any tremors? Any incontinence of urine? Any bowel dysfunction?  Constipation     diarrhea       Current Outpatient Medications on File Prior to Visit  Medication Sig Dispense Refill   acetaminophen (TYLENOL) 325 MG tablet Take 2 tablets (650 mg total) by mouth every 6 (six) hours as needed. 90 tablet 3   escitalopram (LEXAPRO) 10 MG tablet Take 1 tablet (10 mg total) by mouth daily. 30 tablet 5   fluticasone (FLONASE) 50 MCG/ACT nasal spray USE 2 SPRAYS IN BOTH  NOSTRILS DAILY AS NEEDED  FOR ALLERGIES (Patient taking differently: Place 2 sprays into  both nostrils as needed.) 48 g 1   furosemide (LASIX) 20 MG tablet Take 1 tablet (20 mg total) by mouth daily. 30 tablet 0   guaiFENesin (MUCINEX) 600 MG 12 hr tablet Take 1 tablet (600 mg total) by mouth 2 (two) times daily as needed. 30 tablet 0   losartan (COZAAR) 100 MG tablet TAKE 1 TABLET BY MOUTH  DAILY 90 tablet 3   Multiple Vitamins-Minerals (CENTRUM PO) Take 1 tablet by mouth daily.     Multiple Vitamins-Minerals (PRESERVISION AREDS 2 PO) Take 1 capsule by mouth every morning.      QUEtiapine (SEROQUEL) 100 MG tablet Take 1 tablet (100 mg total) by mouth at bedtime. 90 tablet 1   QUEtiapine (SEROQUEL) 25 MG tablet Take one tab daily at 4 pm 30 tablet 5   rivastigmine (EXELON) 1.5 MG capsule Take 1 capsule at night for  1 week and then one capsule 2 times a day 60 capsule 11   rosuvastatin (CRESTOR) 5 MG tablet TAKE 1 TABLET BY MOUTH  DAILY 90 tablet 3   tamsulosin (FLOMAX) 0.4 MG CAPS capsule TAKE 1 CAPSULE BY MOUTH DAILY 100 capsule 2   vitamin C (ASCORBIC ACID) 500 MG tablet Take 500 mg by mouth daily.     No current facility-administered medications on file prior to visit.     Observations/Objective:   There were no vitals filed for this visit. GEN:  The patient appears stated age and is in NAD.  Neurological examination: Patient is awake, alert, not oriented to person, not to place or date . No aphasia or dysarthria. Tangential speech, decreased  comprehension. Remote and recent memory impaired.Unable to repeat phrases. Cranial nerves: Extraocular movements intact with no nystagmus. No facial asymmetry. Motor: moves all extremities symmetrically, at least anti-gravity x 4. No coordination on finger to nose testing . Gait: narrow-based and steady, able to tandem walk adequately.      Follow Up Instructions:    -I discussed the assessment and treatment plan with the patient. The patient was provided an opportunity to ask questions and all were answered. The patient agreed with  the plan and demonstrated an understanding of the instructions.   The patient was advised to call back or seek an in-person evaluation if the symptoms worsen or if the condition fails to improve as anticipated.    Total time spent on today's visit was 37 minutes, including both face-to-face time and nonface-to-face time.  Time included that spent on review of records (prior notes available to me/labs/imaging if pertinent), discussing treatment and goals,  answering patient's questions and coordinating care.   Marlowe Kays, PA-C

## 2023-04-06 NOTE — Patient Instructions (Signed)
Follow up in 6 months  Continue Seroquel for mood as per PCP

## 2023-04-24 IMAGING — CT CT ABD-PELV W/ CM
2 of 5 series · 15 of 46 positions shown, 17 images · IV contrast (APPLIED)
Comparison: CT abdomen and pelvis 08/08/2018

CLINICAL DATA: Abdominal pain.  Diarrhea.

EXAM:
CT ABDOMEN AND PELVIS WITH CONTRAST
TECHNIQUE: Multidetector CT imaging of the abdomen and pelvis was performed
using the standard protocol following bolus administration of
intravenous contrast.

[Series 3: abd pel w · axial · 0.64mm/px · z∈[-386,-31]mm · 12 of 81 slices shown, 14 images]
[im 5/81  soft-tissue]
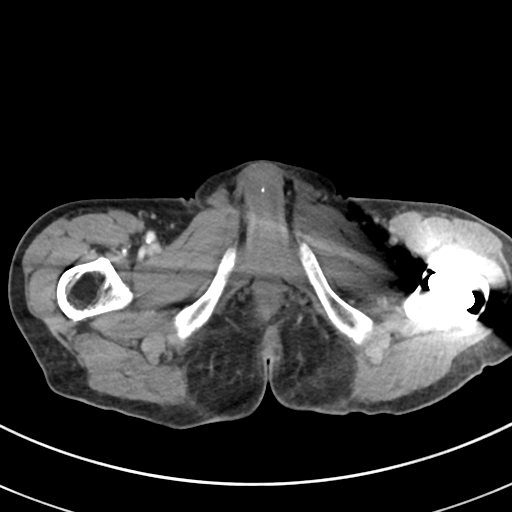
[im 5/81  bone]
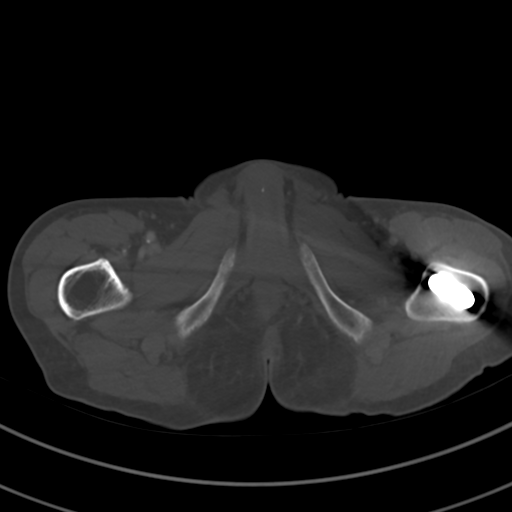
[im 13/81  soft-tissue]
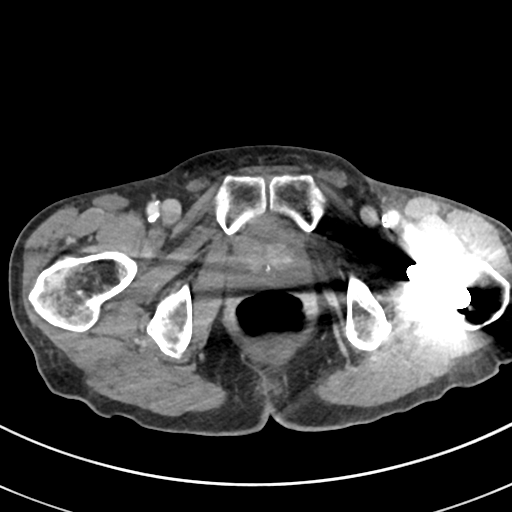
[im 17/81  soft-tissue]
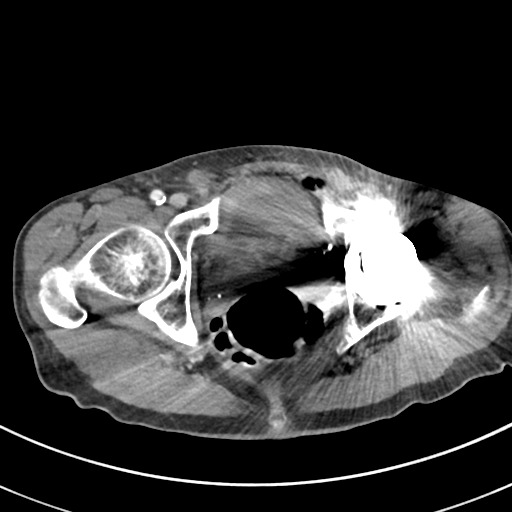
[im 26/81  soft-tissue]
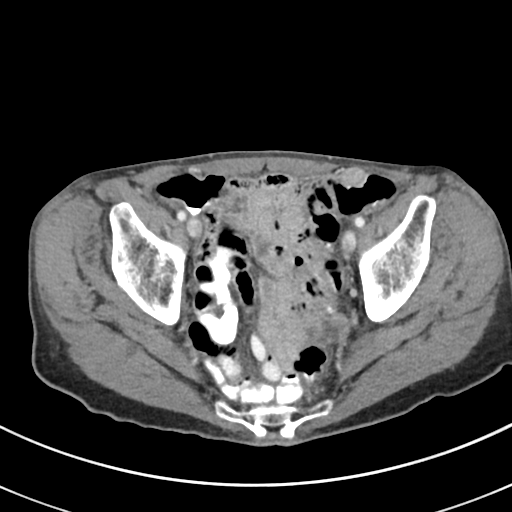
[im 30/81  soft-tissue]
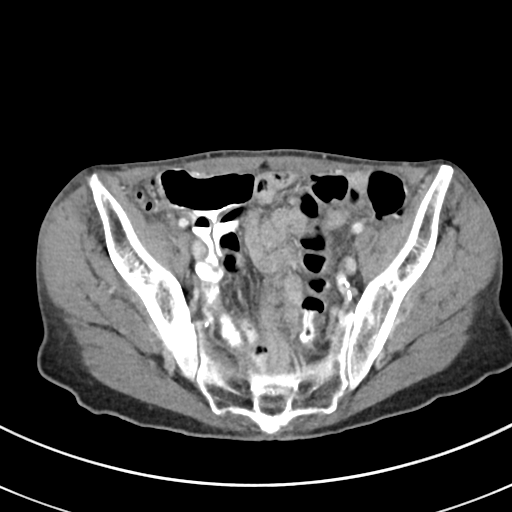
[im 38/81  soft-tissue]
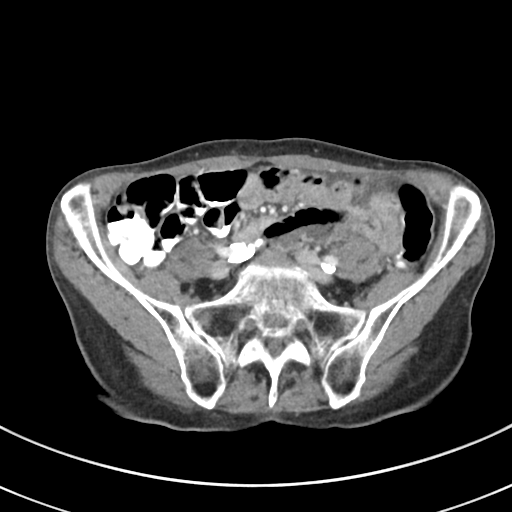
[im 43/81  soft-tissue]
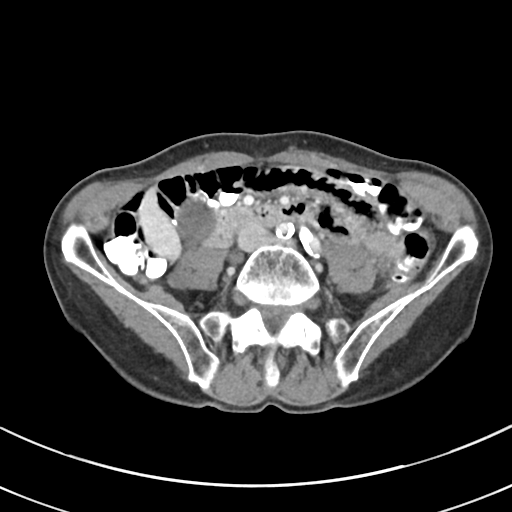
[im 51/81  soft-tissue]
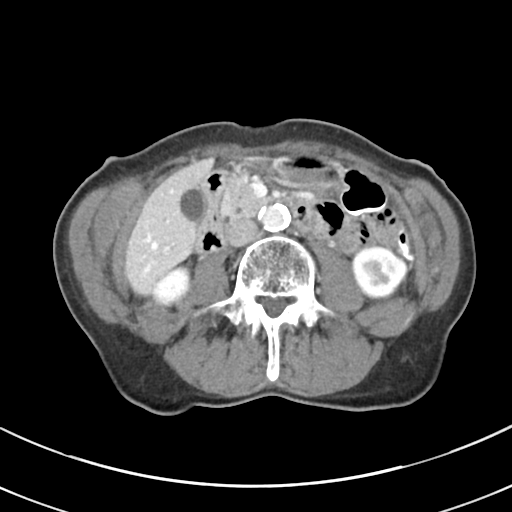
[im 55/81  soft-tissue]
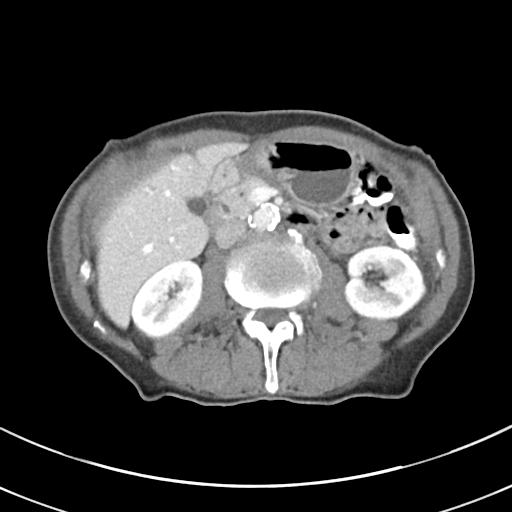
[im 55/81  bone]
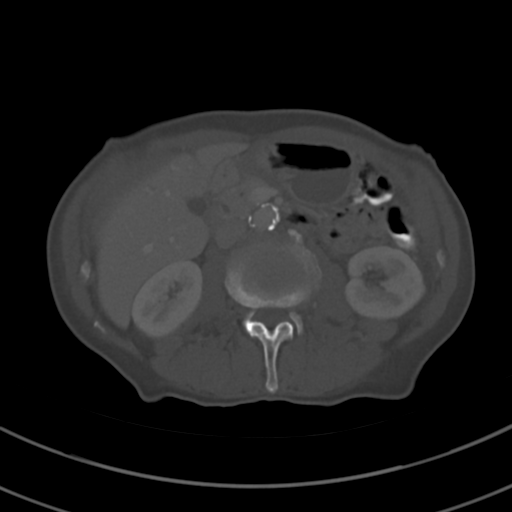
[im 64/81  soft-tissue]
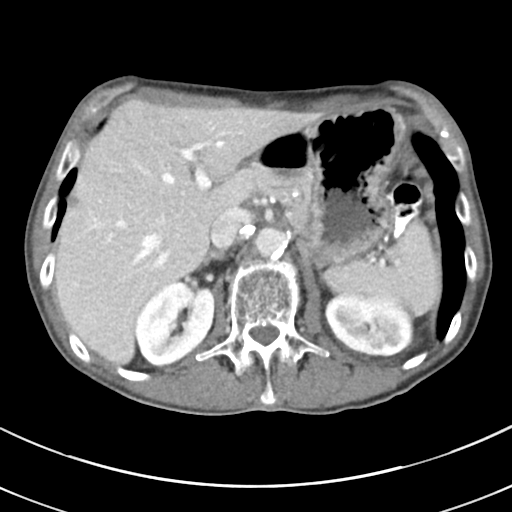
[im 68/81  soft-tissue]
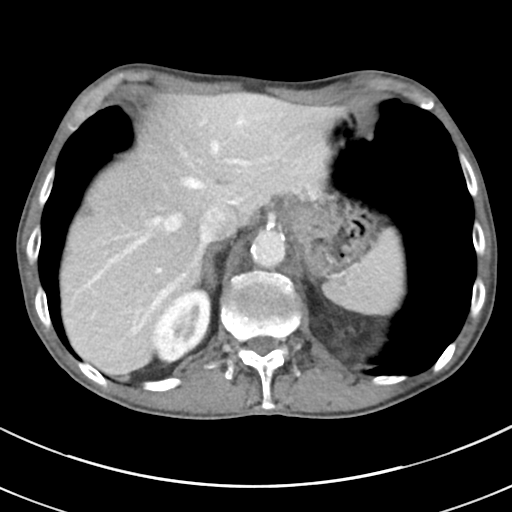
[im 76/81  soft-tissue]
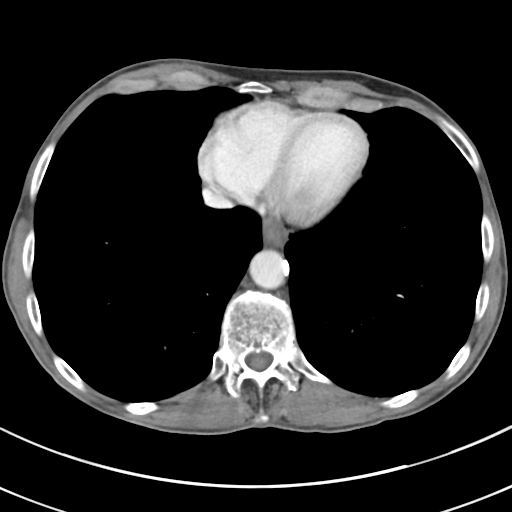

[Series 6: coronal (person_name) · coronal · 0.63mm/px · 3 of 85 slices shown]
[im 29/85  soft-tissue]
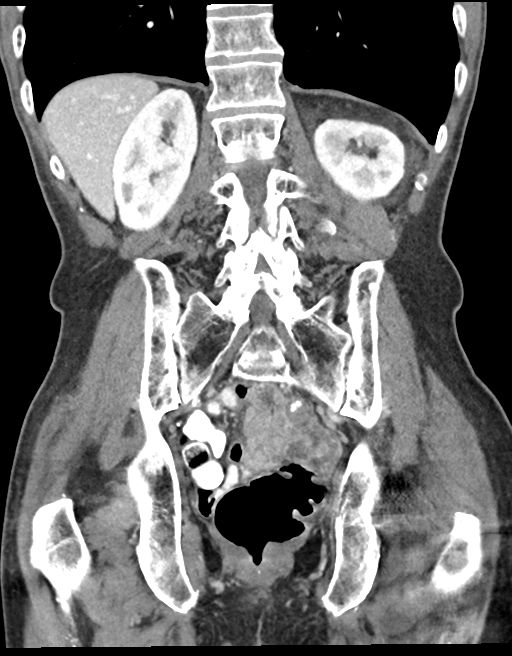
[im 38/85  soft-tissue]
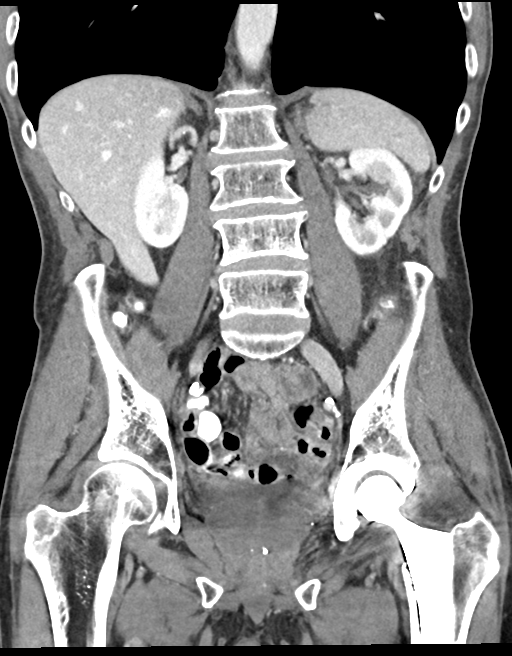
[im 47/85  soft-tissue]
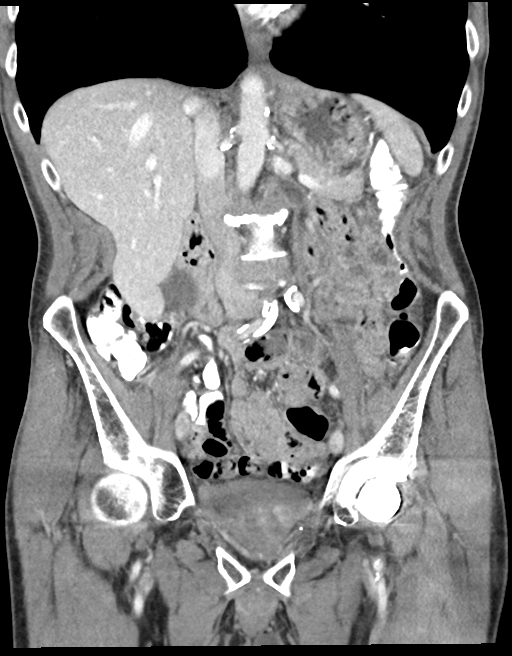

[15 of 46 positions shown; findings below may reference images not displayed]

RADIATION DOSE REDUCTION: This exam was performed according to the
departmental dose-optimization program which includes automated
exposure control, adjustment of the mA and/or kV according to
patient size and/or use of iterative reconstruction technique.

CONTRAST:  80mL OMNIPAQUE IOHEXOL 300 MG/ML  SOLN
FINDINGS: Lower chest: Emphysema. No acute findings at the lung bases. No
pleural effusions. Focal density along the anterior left lower lobe
on sequence 5, image 3 appears stable since 04/09/2021.

Hepatobiliary: Normal appearance of the liver and gallbladder. No
biliary dilatation.

Pancreas: Unremarkable. No pancreatic ductal dilatation or
surrounding inflammatory changes.

Spleen: Normal in size without focal abnormality.

Adrenals/Urinary Tract: Normal adrenal glands. Normal appearance of
both kidneys without stones or hydronephrosis. No suspicious renal
lesions. Diffuse chronic bladder wall thickening. Small low-density
structures in the kidneys probably represent small cysts and do not
require dedicated follow-up.

Stomach/Bowel: Oral contrast in the small and large bowel. No focal
bowel inflammation and no evidence for obstruction. There appears to
be normal gas-filled appendix in the right lower quadrant the
abdomen. Mild wall thickening in the gastric antrum region but this
appears to be symmetric and probably within normal limits.

Vascular/Lymphatic: Diffuse atherosclerotic calcifications in the
aorta and iliac arteries. No evidence for an aortic aneurysm.
Atherosclerotic calcifications involving the SMA and bilateral renal
arteries. No lymph node enlargement in the abdomen or pelvis.

Reproductive: Prostate is enlarged. Limited evaluation of the pelvis
due to artifact from the hip replacement.

Other: Negative for ascites.  Negative for free air.

Musculoskeletal: No acute bone abnormality.
IMPRESSION: 1. No acute abnormality in the abdomen or pelvis.
2. Prostate enlargement with chronic diffuse bladder wall
thickening.
3. Aortic Atherosclerosis (KSWDP-XEP.P) and Emphysema ([id number].W).

## 2023-04-27 ENCOUNTER — Telehealth: Payer: Self-pay | Admitting: Internal Medicine

## 2023-04-27 DIAGNOSIS — U071 COVID-19: Secondary | ICD-10-CM

## 2023-04-27 DIAGNOSIS — R059 Cough, unspecified: Secondary | ICD-10-CM

## 2023-04-27 NOTE — Telephone Encounter (Signed)
Hunter Boone from [facility] called and said patient's wife is concerned. His wife said he has a runny nose and is wheezy; she is requesting Mucinex and a chest x-ray. Hunter Boone would like a call back at [phone].

## 2023-04-28 NOTE — Telephone Encounter (Signed)
Okay for Mucinex 600 mg every 12 hours.  As far as the chest x-ray would they bring him in to our office to have it done?

## 2023-04-29 ENCOUNTER — Other Ambulatory Visit: Payer: Self-pay

## 2023-04-29 DIAGNOSIS — U071 COVID-19: Secondary | ICD-10-CM

## 2023-04-29 MED ORDER — GUAIFENESIN ER 600 MG PO TB12: 600.0000 mg | ORAL_TABLET | Freq: Two times a day (BID) | ORAL | 2 refills | Status: DC | PRN

## 2023-04-29 NOTE — Telephone Encounter (Signed)
Chest x-ray ordered.  Mucinex and occasion list-okay to print and fax

## 2023-04-29 NOTE — Telephone Encounter (Signed)
Both orders faxed today to [phone].

## 2023-05-03 DIAGNOSIS — R059 Cough, unspecified: Secondary | ICD-10-CM | POA: Diagnosis not present

## 2023-05-19 ENCOUNTER — Telehealth: Payer: Self-pay | Admitting: Internal Medicine

## 2023-05-19 NOTE — Telephone Encounter (Signed)
FL2 form received via fax from [facility] & placed in provider's box up front. Please fax back to [phone] when complete.

## 2023-05-19 NOTE — Telephone Encounter (Signed)
Placed in Dr. Carver Fila folder for signature

## 2023-05-20 DIAGNOSIS — Z0279 Encounter for issue of other medical certificate: Secondary | ICD-10-CM

## 2023-05-23 NOTE — Telephone Encounter (Signed)
Form faxed on 05/20/23 and conformation received.

## 2023-06-24 ENCOUNTER — Inpatient Hospital Stay: Payer: Medicare Other | Admitting: Internal Medicine

## 2023-06-26 ENCOUNTER — Encounter: Payer: Self-pay | Admitting: Internal Medicine

## 2023-06-26 NOTE — Progress Notes (Signed)
 Subjective:    Patient ID: Hunter Boone, male    DOB: [birth year], 78 y.o.   MRN: [id number]     HPI [place] is here for an acute visit.  His wife is here with him who helps provide the history given his dementia.  He currently lives in an assisted living facility/memory unit.  Wheezing intermittent since November.  His wife has heard it and staff members have heard.  When someone listen to his chest it was on the right side.  His wife denies coughing, shortness of breath or fevers.  She denies any cold symptoms beyond a runny nose.  He is taking all medications as prescribed.  Medications and allergies reviewed with patient and updated if appropriate.  Current Outpatient Medications on File Prior to Visit  Medication Sig Dispense Refill   acetaminophen  (TYLENOL ) 325 MG tablet Take 2 tablets (650 mg total) by mouth every 6 (six) hours as needed. 90 tablet 3   escitalopram  (LEXAPRO ) 10 MG tablet Take 1 tablet (10 mg total) by mouth daily. 30 tablet 5   furosemide  (LASIX ) 20 MG tablet Take 1 tablet (20 mg total) by mouth daily. 30 tablet 0   guaiFENesin  (MUCINEX ) 600 MG 12 hr tablet Take 1 tablet (600 mg total) by mouth 2 (two) times daily as needed. 30 tablet 2   losartan  (COZAAR ) 100 MG tablet TAKE 1 TABLET BY MOUTH  DAILY 90 tablet 3   Multiple Vitamins-Minerals (CENTRUM PO) Take 1 tablet by mouth daily.     Multiple Vitamins-Minerals (PRESERVISION AREDS 2 PO) Take 1 capsule by mouth every morning.      QUEtiapine  (SEROQUEL ) 100 MG tablet Take 1 tablet (100 mg total) by mouth at bedtime. 90 tablet 1   QUEtiapine  (SEROQUEL ) 25 MG tablet Take one tab daily at 4 pm 30 tablet 5   rivastigmine  (EXELON ) 1.5 MG capsule Take 1 capsule at night for  1 week and then one capsule 2 times a day 60 capsule 11   rosuvastatin  (CRESTOR ) 5 MG tablet TAKE 1 TABLET BY MOUTH  DAILY 90 tablet 3   tamsulosin  (FLOMAX ) 0.4 MG CAPS capsule TAKE 1 CAPSULE BY MOUTH DAILY 100 capsule 2   vitamin C   (ASCORBIC ACID ) 500 MG tablet Take 500 mg by mouth daily.     No current facility-administered medications on file prior to visit.   Review of systems noted by wife  Review of Systems  Unable to perform ROS: Dementia  Constitutional:  Negative for chills and fever.  HENT:  Negative for congestion and sore throat.   Respiratory:  Positive for wheezing. Negative for cough and shortness of breath.        Objective:   Vitals:   06/27/23 1112  BP: 120/70  Pulse: 68  Temp: 98 F (36.7 C)  SpO2: 94%   BP Readings from Last 3 Encounters:  06/27/23 120/70  01/21/23 128/74  10/05/22 (!) 146/76   Wt Readings from Last 3 Encounters:  06/27/23 142 lb (64.4 kg)  01/21/23 142 lb 6.4 oz (64.6 kg)  10/05/22 121 lb (54.9 kg)   Body mass index is 19.26 kg/m.    Physical Exam Constitutional:      General: He is not in acute distress.    Appearance: Normal appearance. He is not ill-appearing.  HENT:     Head: Normocephalic and atraumatic.  Eyes:     Conjunctiva/sclera: Conjunctivae normal.  Cardiovascular:     Rate and Rhythm: Normal rate and  regular rhythm.     Heart sounds: Normal heart sounds.  Pulmonary:     Effort: Pulmonary effort is normal. No respiratory distress.     Breath sounds: Normal breath sounds. No wheezing or rales.  Musculoskeletal:     Right lower leg: No edema.     Left lower leg: No edema.  Skin:    General: Skin is warm and dry.     Findings: No rash.  Neurological:     Mental Status: He is alert. Mental status is at baseline.  Psychiatric:        Mood and Affect: Mood normal.        Lab Results  Component Value Date   WBC 10.7 (H) 01/21/2023   HGB 11.8 (L) 01/21/2023   HCT 37.1 (L) 01/21/2023   PLT 349.0 01/21/2023   GLUCOSE 104 (H) 01/21/2023   CHOL 95 01/21/2023   TRIG 121.0 01/21/2023   HDL 35.60 (L) 01/21/2023   LDLCALC 35 01/21/2023   ALT 52 01/21/2023   AST 41 (H) 01/21/2023   NA 138 01/21/2023   K 3.5 01/21/2023   CL 101  01/21/2023   CREATININE 1.28 01/21/2023   BUN 27 (H) 01/21/2023   CO2 29 01/21/2023   TSH 1.60 01/21/2023   PSA 9.52 (H) 08/16/2017   INR 1.01 05/24/2016   HGBA1C 6.4 01/21/2023     Assessment & Plan:    See Problem List for Assessment and Plan of chronic medical problems.

## 2023-06-27 ENCOUNTER — Ambulatory Visit (INDEPENDENT_AMBULATORY_CARE_PROVIDER_SITE_OTHER): Payer: Medicare Other | Admitting: Internal Medicine

## 2023-06-27 ENCOUNTER — Ambulatory Visit (INDEPENDENT_AMBULATORY_CARE_PROVIDER_SITE_OTHER): Payer: Medicare Other

## 2023-06-27 VITALS — BP 120/70 | HR 68 | Temp 98.0°F | Ht 72.0 in | Wt 142.0 lb

## 2023-06-27 DIAGNOSIS — R062 Wheezing: Secondary | ICD-10-CM

## 2023-06-27 DIAGNOSIS — N401 Enlarged prostate with lower urinary tract symptoms: Secondary | ICD-10-CM

## 2023-06-27 DIAGNOSIS — J449 Chronic obstructive pulmonary disease, unspecified: Secondary | ICD-10-CM | POA: Diagnosis not present

## 2023-06-27 DIAGNOSIS — I10 Essential (primary) hypertension: Secondary | ICD-10-CM | POA: Diagnosis not present

## 2023-06-27 DIAGNOSIS — F03911 Unspecified dementia, unspecified severity, with agitation: Secondary | ICD-10-CM

## 2023-06-27 DIAGNOSIS — N138 Other obstructive and reflux uropathy: Secondary | ICD-10-CM | POA: Diagnosis not present

## 2023-06-27 DIAGNOSIS — R918 Other nonspecific abnormal finding of lung field: Secondary | ICD-10-CM | POA: Diagnosis not present

## 2023-06-27 MED ORDER — AMOXICILLIN-POT CLAVULANATE 875-125 MG PO TABS: 1.0000 | ORAL_TABLET | Freq: Two times a day (BID) | ORAL | 0 refills | Status: AC

## 2023-06-27 MED ORDER — IPRATROPIUM BROMIDE 0.03 % NA SOLN: 2.0000 | Freq: Two times a day (BID) | NASAL | 12 refills | Status: AC

## 2023-06-27 NOTE — Assessment & Plan Note (Addendum)
 Chronic Following with neurology Currently on Exelon 1.5 mg twice daily Continue Seroquel 25 mg at 4 PM and Seroquel 100 mg at bedtime which seems to be working well Continue Lexapro 10 mg daily

## 2023-06-27 NOTE — Assessment & Plan Note (Signed)
Chronic Blood pressure well controlled Continue losartan 100 mg daily

## 2023-06-27 NOTE — Assessment & Plan Note (Signed)
 Subacute Has had intermittent wheezing that has been audible since November Wife denies any coughing, shortness of breath, fevers or other cold symptoms When a staff member listen to his lungs it was only on the right side Unlikely acute cold Possibly related to COPD, chronic lung disease Has history of right lung nodule that was being monitored and discussed possibility of a mass causing other symptoms No wheezing on exam today Will get chest x-ray today Wife deferred CT scan at this time, but it is something we can consider in the future Wheezing is mild and does not bother him so at this point I would not advise any treatment which would likely have to be nebulizer treatments

## 2023-06-27 NOTE — Patient Instructions (Addendum)
      Have a chest xray today.      Medications changes include :   start ipratropium nasal spray twice daily for runny nose     Return for cancel Feb appt,  schedule follow up for end of July.

## 2023-06-27 NOTE — Assessment & Plan Note (Signed)
Chronic Continue Flomax 0.4 mg daily 

## 2023-07-22 ENCOUNTER — Ambulatory Visit: Payer: Self-pay | Admitting: Internal Medicine

## 2023-07-22 ENCOUNTER — Encounter: Payer: Self-pay | Admitting: Internal Medicine

## 2023-07-22 ENCOUNTER — Ambulatory Visit: Payer: Medicare Other | Admitting: Internal Medicine

## 2023-07-22 NOTE — Telephone Encounter (Signed)
 Chief Complaint: Pain in scrotum Symptoms: Redness, nose dripping  Pertinent Negatives: Patient denies swelling, fever Disposition: [] ED /[] Urgent Care (no appt availability in office) / [x] Appointment(In office/virtual)/ []  [facility]/ [] Home Care/ [] Refused Recommended Disposition /[] [facility]/ []  Follow-up with PCP Additional Notes: Spoke with pt wife, [place]. Pt has dementia. Pt caregiver noticed pt was in pain in scrotum when giving pt bath. Blood was noticed coming from pt briefs. Vitals currently- O2 91%, 136/73, 86, 97.38F. Pt has COPD, and has been a smoker all his life. Pt diagnosed with pneumonia in Jan. Pt wife unable to tell the level of pt pain due to dementia. Pt scheduled for appt today. This RN educated pt wife on home care, new-worsening symptoms, when to call back/seek emergent care. Pt wife verbalized understanding and agrees to plan.   Copied from CRM [id number]. Topic: Clinical - Red Word Triage >> Jul 22, 2023  3:05 PM Alexandria E wrote: [facility] that prompted transfer to Nurse Triage: Patient's wife on the phone, states her husband (patient) is complaining of pain in the scrotum area, with blood coming in his brief, wife is not sure if the blood is coming from his penis or not. Patient was diagnosed with pneumonia back in January, she just wanted me to relay that as well. Reason for Disposition  Blood in urine (red, pink, or tea-colored)  Answer Assessment - Initial Assessment Questions 1. LOCATION and RADIATION: Where is the pain located?      Scrotum 4. ONSET: When did the pain start?     Today as far as she knows 5. PATTERN: Does it come and go, or has it been constant since it started?     Hard to tell with someone with dementia 6. SCROTAL APPEARANCE: What does the scrotum look like? Is there any swelling or redness?      Red 8. OTHER SYMPTOMS: Do you have any other symptoms? (e.g., abdomen pain, difficulty passing urine, fever,  vomiting)     Denies  Protocols used: Scrotum Pain-A-AH

## 2023-07-25 ENCOUNTER — Emergency Department (HOSPITAL_COMMUNITY): Payer: Medicare Other

## 2023-07-25 ENCOUNTER — Ambulatory Visit: Payer: Medicare Other | Admitting: Emergency Medicine

## 2023-07-25 ENCOUNTER — Other Ambulatory Visit: Payer: Self-pay

## 2023-07-25 ENCOUNTER — Inpatient Hospital Stay (HOSPITAL_COMMUNITY)
Admission: EM | Admit: 2023-07-25 | Discharge: 2023-07-28 | DRG: 193 | Disposition: A | Discharge status: Skilled Nursing Facility | Payer: Medicare Other | Source: Skilled Nursing Facility | Attending: Family Medicine | Admitting: Family Medicine

## 2023-07-25 ENCOUNTER — Ambulatory Visit: Payer: Medicare Other | Admitting: Internal Medicine

## 2023-07-25 ENCOUNTER — Encounter (HOSPITAL_COMMUNITY): Payer: Self-pay | Admitting: Emergency Medicine

## 2023-07-25 DIAGNOSIS — G309 Alzheimer's disease, unspecified: Secondary | ICD-10-CM | POA: Diagnosis present

## 2023-07-25 DIAGNOSIS — I700 Atherosclerosis of aorta: Secondary | ICD-10-CM | POA: Diagnosis not present

## 2023-07-25 DIAGNOSIS — Z888 Allergy status to other drugs, medicaments and biological substances status: Secondary | ICD-10-CM | POA: Diagnosis not present

## 2023-07-25 DIAGNOSIS — E876 Hypokalemia: Secondary | ICD-10-CM | POA: Diagnosis not present

## 2023-07-25 DIAGNOSIS — R0902 Hypoxemia: Principal | ICD-10-CM

## 2023-07-25 DIAGNOSIS — J441 Chronic obstructive pulmonary disease with (acute) exacerbation: Secondary | ICD-10-CM | POA: Diagnosis not present

## 2023-07-25 DIAGNOSIS — F02B3 Dementia in other diseases classified elsewhere, moderate, with mood disturbance: Secondary | ICD-10-CM | POA: Diagnosis present

## 2023-07-25 DIAGNOSIS — F03911 Unspecified dementia, unspecified severity, with agitation: Secondary | ICD-10-CM | POA: Diagnosis not present

## 2023-07-25 DIAGNOSIS — E785 Hyperlipidemia, unspecified: Secondary | ICD-10-CM

## 2023-07-25 DIAGNOSIS — Z860101 Personal history of adenomatous and serrated colon polyps: Secondary | ICD-10-CM

## 2023-07-25 DIAGNOSIS — R509 Fever, unspecified: Secondary | ICD-10-CM | POA: Diagnosis not present

## 2023-07-25 DIAGNOSIS — J449 Chronic obstructive pulmonary disease, unspecified: Secondary | ICD-10-CM | POA: Diagnosis not present

## 2023-07-25 DIAGNOSIS — R319 Hematuria, unspecified: Secondary | ICD-10-CM | POA: Diagnosis present

## 2023-07-25 DIAGNOSIS — J189 Pneumonia, unspecified organism: Principal | ICD-10-CM

## 2023-07-25 DIAGNOSIS — F02B4 Dementia in other diseases classified elsewhere, moderate, with anxiety: Secondary | ICD-10-CM | POA: Diagnosis present

## 2023-07-25 DIAGNOSIS — F17210 Nicotine dependence, cigarettes, uncomplicated: Secondary | ICD-10-CM | POA: Diagnosis not present

## 2023-07-25 DIAGNOSIS — E870 Hyperosmolality and hypernatremia: Secondary | ICD-10-CM | POA: Diagnosis not present

## 2023-07-25 DIAGNOSIS — Z8249 Family history of ischemic heart disease and other diseases of the circulatory system: Secondary | ICD-10-CM | POA: Diagnosis not present

## 2023-07-25 DIAGNOSIS — Z1152 Encounter for screening for COVID-19: Secondary | ICD-10-CM | POA: Diagnosis not present

## 2023-07-25 DIAGNOSIS — J9601 Acute respiratory failure with hypoxia: Secondary | ICD-10-CM | POA: Diagnosis present

## 2023-07-25 DIAGNOSIS — I10 Essential (primary) hypertension: Secondary | ICD-10-CM | POA: Diagnosis not present

## 2023-07-25 DIAGNOSIS — J439 Emphysema, unspecified: Secondary | ICD-10-CM | POA: Diagnosis not present

## 2023-07-25 DIAGNOSIS — N138 Other obstructive and reflux uropathy: Secondary | ICD-10-CM | POA: Diagnosis present

## 2023-07-25 DIAGNOSIS — Z833 Family history of diabetes mellitus: Secondary | ICD-10-CM

## 2023-07-25 DIAGNOSIS — Z96642 Presence of left artificial hip joint: Secondary | ICD-10-CM | POA: Diagnosis present

## 2023-07-25 DIAGNOSIS — N401 Enlarged prostate with lower urinary tract symptoms: Secondary | ICD-10-CM | POA: Diagnosis present

## 2023-07-25 DIAGNOSIS — L89151 Pressure ulcer of sacral region, stage 1: Secondary | ICD-10-CM | POA: Diagnosis present

## 2023-07-25 DIAGNOSIS — Z66 Do not resuscitate: Secondary | ICD-10-CM | POA: Diagnosis present

## 2023-07-25 DIAGNOSIS — F0390 Unspecified dementia without behavioral disturbance: Secondary | ICD-10-CM | POA: Diagnosis present

## 2023-07-25 DIAGNOSIS — K219 Gastro-esophageal reflux disease without esophagitis: Secondary | ICD-10-CM | POA: Diagnosis present

## 2023-07-25 DIAGNOSIS — Z79899 Other long term (current) drug therapy: Secondary | ICD-10-CM

## 2023-07-25 DIAGNOSIS — J440 Chronic obstructive pulmonary disease with acute lower respiratory infection: Secondary | ICD-10-CM | POA: Diagnosis not present

## 2023-07-25 DIAGNOSIS — F32A Depression, unspecified: Secondary | ICD-10-CM | POA: Diagnosis present

## 2023-07-25 DIAGNOSIS — Z820 Family history of epilepsy and other diseases of the nervous system: Secondary | ICD-10-CM

## 2023-07-25 DIAGNOSIS — H3530 Unspecified macular degeneration: Secondary | ICD-10-CM | POA: Diagnosis present

## 2023-07-25 DIAGNOSIS — R0602 Shortness of breath: Secondary | ICD-10-CM | POA: Diagnosis not present

## 2023-07-25 DIAGNOSIS — R7303 Prediabetes: Secondary | ICD-10-CM | POA: Diagnosis present

## 2023-07-25 DIAGNOSIS — R918 Other nonspecific abnormal finding of lung field: Secondary | ICD-10-CM | POA: Diagnosis not present

## 2023-07-25 DIAGNOSIS — Z800 Family history of malignant neoplasm of digestive organs: Secondary | ICD-10-CM

## 2023-07-25 LAB — BASIC METABOLIC PANEL
Anion gap: 10 (ref 5–15)
BUN: 20 mg/dL (ref 8–23)
CO2: 26 mmol/L (ref 22–32)
Calcium: 8.7 mg/dL — ABNORMAL LOW (ref 8.9–10.3)
Chloride: 104 mmol/L (ref 98–111)
Creatinine, Ser: 1.04 mg/dL (ref 0.61–1.24)
GFR, Estimated: >60 mL/min (ref >60)
Glucose, Bld: 136 mg/dL — ABNORMAL HIGH (ref 70–99)
Potassium: 3.2 mmol/L — ABNORMAL LOW (ref 3.5–5.1)
Sodium: 140 mmol/L (ref 135–145)

## 2023-07-25 LAB — MAGNESIUM: Magnesium: 2 mg/dL (ref 1.7–2.4)

## 2023-07-25 LAB — CBC WITH DIFFERENTIAL/PLATELET
Abs Immature Granulocytes: 0.16 10*3/uL — ABNORMAL HIGH (ref 0.00–0.07)
Basophils Absolute: 0 10*3/uL (ref 0.0–0.1)
Basophils Relative: 0 %
Eosinophils Absolute: 0 10*3/uL (ref 0.0–0.5)
Eosinophils Relative: 0 %
HCT: 36.3 % — ABNORMAL LOW (ref 39.0–52.0)
Hemoglobin: 11.6 g/dL — ABNORMAL LOW (ref 13.0–17.0)
Immature Granulocytes: 1 %
Lymphocytes Relative: 8 %
Lymphs Abs: 0.9 10*3/uL (ref 0.7–4.0)
MCH: 29.3 pg (ref 26.0–34.0)
MCHC: 32 g/dL (ref 30.0–36.0)
MCV: 91.7 fL (ref 80.0–100.0)
Monocytes Absolute: 1.8 10*3/uL — ABNORMAL HIGH (ref 0.1–1.0)
Monocytes Relative: 15 %
Neutro Abs: 8.8 10*3/uL — ABNORMAL HIGH (ref 1.7–7.7)
Neutrophils Relative %: 76 %
Platelets: 209 10*3/uL (ref 150–400)
RBC: 3.96 MIL/uL — ABNORMAL LOW (ref 4.22–5.81)
RDW: 15.5 % (ref 11.5–15.5)
WBC: 11.7 10*3/uL — ABNORMAL HIGH (ref 4.0–10.5)
nRBC: 0 % (ref 0.0–0.2)

## 2023-07-25 LAB — RESP PANEL BY RT-PCR (RSV, FLU A&B, COVID)  RVPGX2
Influenza A by PCR: NEGATIVE
Influenza B by PCR: NEGATIVE
Resp Syncytial Virus by PCR: NEGATIVE
SARS Coronavirus 2 by RT PCR: NEGATIVE

## 2023-07-25 LAB — TROPONIN I (HIGH SENSITIVITY)
Troponin I (High Sensitivity): 24 ng/L — ABNORMAL HIGH (ref <18)
Troponin I (High Sensitivity): 23 ng/L — ABNORMAL HIGH (ref <18)

## 2023-07-25 LAB — BRAIN NATRIURETIC PEPTIDE: B Natriuretic Peptide: 112.6 pg/mL — ABNORMAL HIGH (ref 0.0–100.0)

## 2023-07-25 MED ORDER — SENNOSIDES-DOCUSATE SODIUM 8.6-50 MG PO TABS: 1.0000 | ORAL_TABLET | Freq: Every evening | ORAL | Status: DC | PRN

## 2023-07-25 MED ORDER — SODIUM CHLORIDE 0.9 % IV BOLUS
500.0000 mL | Freq: Once | INTRAVENOUS | Status: AC
  Administered 2023-07-26: 500 mL via INTRAVENOUS

## 2023-07-25 MED ORDER — POTASSIUM CHLORIDE CRYS ER 20 MEQ PO TBCR
40.0000 meq | EXTENDED_RELEASE_TABLET | Freq: Once | ORAL | Status: DC
  Filled 2023-07-25: qty 2

## 2023-07-25 MED ORDER — SODIUM CHLORIDE 0.9 % IV SOLN: 1.0000 g | Freq: Once | INTRAVENOUS | Status: DC

## 2023-07-25 MED ORDER — SODIUM CHLORIDE 0.9 % IV SOLN
500.0000 mg | Freq: Every day | INTRAVENOUS | Status: DC
  Administered 2023-07-26 – 2023-07-27 (×3): 500 mg via INTRAVENOUS
  Filled 2023-07-25 (×4): qty 5

## 2023-07-25 MED ORDER — ONDANSETRON HCL 4 MG/2ML IJ SOLN: 4.0000 mg | Freq: Four times a day (QID) | INTRAMUSCULAR | Status: DC | PRN

## 2023-07-25 MED ORDER — ONDANSETRON HCL 4 MG PO TABS: 4.0000 mg | ORAL_TABLET | Freq: Four times a day (QID) | ORAL | Status: DC | PRN

## 2023-07-25 MED ORDER — IPRATROPIUM-ALBUTEROL 0.5-2.5 (3) MG/3ML IN SOLN
3.0000 mL | Freq: Once | RESPIRATORY_TRACT | Status: AC
  Administered 2023-07-25: 3 mL via RESPIRATORY_TRACT
  Filled 2023-07-25: qty 3

## 2023-07-25 MED ORDER — ALBUTEROL SULFATE (2.5 MG/3ML) 0.083% IN NEBU: 2.5000 mg | INHALATION_SOLUTION | RESPIRATORY_TRACT | Status: DC | PRN

## 2023-07-25 MED ORDER — METHYLPREDNISOLONE SODIUM SUCC 125 MG IJ SOLR
125.0000 mg | Freq: Once | INTRAMUSCULAR | Status: AC
  Administered 2023-07-25: 125 mg via INTRAVENOUS
  Filled 2023-07-25: qty 2

## 2023-07-25 MED ORDER — ACETAMINOPHEN 650 MG RE SUPP: 650.0000 mg | Freq: Four times a day (QID) | RECTAL | Status: DC | PRN

## 2023-07-25 MED ORDER — SODIUM CHLORIDE 0.9 % IV SOLN: 500.0000 mg | Freq: Once | INTRAVENOUS | Status: DC

## 2023-07-25 MED ORDER — ENOXAPARIN SODIUM 40 MG/0.4ML IJ SOSY
40.0000 mg | PREFILLED_SYRINGE | INTRAMUSCULAR | Status: DC
  Administered 2023-07-26 – 2023-07-28 (×3): 40 mg via SUBCUTANEOUS
  Filled 2023-07-25 (×3): qty 0.4

## 2023-07-25 MED ORDER — GUAIFENESIN 100 MG/5ML PO LIQD
5.0000 mL | ORAL | Status: DC | PRN
  Administered 2023-07-28: 5 mL via ORAL
  Filled 2023-07-25: qty 10

## 2023-07-25 MED ORDER — ACETAMINOPHEN 325 MG PO TABS
650.0000 mg | ORAL_TABLET | Freq: Four times a day (QID) | ORAL | Status: DC | PRN
  Administered 2023-07-28: 650 mg via ORAL
  Filled 2023-07-25: qty 2

## 2023-07-25 MED ORDER — SODIUM CHLORIDE 0.9 % IV SOLN
2.0000 g | INTRAVENOUS | Status: DC
  Administered 2023-07-26 – 2023-07-27 (×3): 2 g via INTRAVENOUS
  Filled 2023-07-25 (×3): qty 20

## 2023-07-25 NOTE — H&P (Signed)
 PCP:   Pincus Sanes, MD   Chief Complaint:  Hypoxia  HPI: This is a 78 year old male with past medical history of moderate Alzheimer's dementia, COPD, BPH with symptoms, HTN, tobacco abuse, PTSD, anxiety and depression.  Patient sent in from nursing home when he was noted to be hypoxic 70% on room air.  Patient complained of shortness of breath.  Patient not on home oxygen.  Patient recently completed treatment for pneumonia  In the ER presenting vitals stable.  Tmax 99.4.  BNP 3.2.  Troponin 24=>23.  WBC 11.7.  Respiratory panel negative.  CXR consistent with pneumonia.  Patient hypoxic in the ER.  On 3 L oxygen, satting 93%.  Review of Systems:  Per HPI  Past Medical History: Past Medical History:  Diagnosis Date   Aortic atherosclerosis (HCC)    chronic--- followed by pcp   BPH without obstruction/lower urinary tract symptoms    urologist--- dr bell   COPD with emphysema (HCC)    followed by pcp---  never used oxygen,  still smokes 1 ppd   Diverticulosis of colon    Full dentures    Heart murmur    per pt's pcp note 02-27-2020 in epic , she heard murmur   Hemorrhoids    History of adenomatous polyp of colon    History of concussion    per pt in [place] ,  no resiudal   History of elevated PSA    negative bx 2019   History of gastroesophageal reflux (GERD)    03-06-2020 per pt no issues for several yrs   History of pneumothorax 2017   traumatic due to fall   Hypertension    followed by pcp   Left inguinal hernia    Macular degeneration, left eye    Mild neurocognitive disorder    neruologist--- dr Karel Jarvis   Nodule of right lung    lower lobe--- followed by pcp--- last chest CT 05-07-2019 stable   Pre-diabetes    Wears glasses    Past Surgical History:  Procedure Laterality Date   COLONOSCOPY  last one 04-20-2016   EVALUATION UNDER ANESTHESIA WITH HEMORRHOIDECTOMY N/A 07/01/2017   Procedure: ANORECTAL EXAMINATION UNDER ANESTHESIA, HEMORRHOIDECTOMY, HEMORRHOIDAL  LIGATION/PEXY;  Surgeon: Karie Soda, MD;  Location: [facility];  Service: General;  Laterality: N/A;  GENERAL AND LOCAL   INGUINAL HERNIA REPAIR N/A 03/12/2020   Procedure: LAPARSCOPIC LEFT INGUINAL HERNIA REPAIR; BILATERAL FEMORAL HERNIA REPAIR; BILATERAL TAP BLOCK;  Surgeon: Karie Soda, MD;  Location: [facility];  Service: General;  Laterality: N/A;   LAPAROSCOPIC ABDOMINAL EXPLORATION N/A 03/12/2020   Procedure: LAPAROSCOPIC  EXPLORATION AND REPAIRS OF HERNIAS FOUND;  Surgeon: Karie Soda, MD;  Location: [facility];  Service: General;  Laterality: N/A;   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   Procedure: TOTAL HIP ARTHROPLASTY ANTERIOR APPROACH;  Surgeon: Samson Frederic, MD;  Location: [facility];  Service: Orthopedics;  Laterality: Left;    Medications: Prior to Admission medications   Medication Sig Start Date End Date Taking? Authorizing Provider  rosuvastatin (CRESTOR) 5 MG tablet TAKE 1 TABLET BY MOUTH  DAILY 06/04/21  Yes Burns, Bobette Mo, MD  tamsulosin (FLOMAX) 0.4 MG CAPS capsule TAKE 1 CAPSULE BY MOUTH DAILY 10/25/22  Yes Burns, Bobette Mo, MD  vitamin C (ASCORBIC ACID) 500 MG tablet Take 500 mg by mouth daily.   Yes [provider]  acetaminophen (TYLENOL) 325 MG tablet Take 2 tablets (650 mg total) by mouth every 6 (six) hours as  needed. 08/26/22   Pincus Sanes, MD  escitalopram (LEXAPRO) 10 MG tablet Take 1 tablet (10 mg total) by mouth daily. 06/26/22   Pincus Sanes, MD  furosemide (LASIX) 20 MG tablet Take 1 tablet (20 mg total) by mouth daily. 04/29/22   Pincus Sanes, MD  guaiFENesin (MUCINEX) 600 MG 12 hr tablet Take 1 tablet (600 mg total) by mouth 2 (two) times daily as needed. 04/29/23   Pincus Sanes, MD  ipratropium (ATROVENT) 0.03 % nasal spray Place 2 sprays into both nostrils every 12 (twelve) hours. 06/27/23   Pincus Sanes, MD  losartan (COZAAR) 100 MG tablet TAKE 1 TABLET BY MOUTH  DAILY 06/04/21   Pincus Sanes, MD  Multiple  Vitamins-Minerals (CENTRUM PO) Take 1 tablet by mouth daily.    [provider]  Multiple Vitamins-Minerals (PRESERVISION AREDS 2 PO) Take 1 capsule by mouth every morning.     [provider]  QUEtiapine (SEROQUEL) 100 MG tablet Take 1 tablet (100 mg total) by mouth at bedtime. 07/16/22   Pincus Sanes, MD  QUEtiapine (SEROQUEL) 25 MG tablet Take one tab daily at 4 pm 10/18/22   Pincus Sanes, MD  rivastigmine (EXELON) 1.5 MG capsule Take 1 capsule at night for  1 week and then one capsule 2 times a day 12/31/21   Marcos Eke, PA-C    Allergies:   Allergies  Allergen Reactions   Namenda [Memantine] Other (See Comments)    Increased confusion    Social History:  reports that he has quit smoking. His smoking use included cigarettes and pipe. He has a 56 pack-year smoking history. He has never used smokeless tobacco. He reports that he does not currently use alcohol. He reports that he does not use drugs.  Family History: Family History  Problem Relation Age of Onset   Heart attack Father        Early 44s   Diabetes Father    Dementia Father        Unspecified type   Diabetes Mother    Dementia Mother        Unspecified type, said to be early-stages   Liver cancer Maternal Aunt    Alzheimer's disease Sister        Symptom onset in mid to late 51s   Stroke Neg Hx    COPD Neg Hx    Colon cancer Neg Hx    Esophageal cancer Neg Hx    Rectal cancer Neg Hx    Stomach cancer Neg Hx    Asthma Neg Hx     Physical Exam: Vitals:   07/25/23 1512 07/25/23 1905 07/25/23 1943 07/25/23 2100  BP: (!) 125/92 (!) 155/114 110/75 119/67  Pulse: (!) 102 84 77 76  Resp: (!) 22 (!) 27 (!) 26 (!) 24  Temp: 99.4 F (37.4 C)  98 F (36.7 C)   TempSrc: Oral  Axillary   SpO2: 96% 92% 93% 91%    General:  A&Ox3, under nourished, scoliotic, in no distress Eyes: Pink conjunctiva, no scleral icterus ENT: Moist oral mucosa, neck supple, no thyromegaly Lungs: clear, no wheeze,  no crackles, no use of accessory muscles Cardiovascular: RRR, no regurgitation, no bruits, no JVD Abdomen: soft, positive BS, NTND,  not an acute abdomen GU: not examined Neuro: CN II - XII grossly intact, sensation intact Musculoskeletal: strength 5/5 all extremities, no edema Skin: no rash, no subcutaneous crepitation, no decubitus Psych: Not talkative patient   Labs  on Admission:  Recent Labs    07/25/23 1614 07/25/23 2021  NA 140  --   K 3.2*  --   CL 104  --   CO2 26  --   GLUCOSE 136*  --   BUN 20  --   CREATININE 1.04  --   CALCIUM 8.7*  --   MG  --  2.0    Recent Labs    07/25/23 1614  WBC 11.7*  NEUTROABS 8.8*  HGB 11.6*  HCT 36.3*  MCV 91.7  PLT 209    Micro Results: Recent Results (from the past 240 hours)  Resp panel by RT-PCR (RSV, Flu A&B, Covid) Anterior Nasal Swab     Status: None   Collection Time: 07/25/23  4:14 PM   Specimen: Anterior Nasal Swab  Result Value Ref Range Status   SARS Coronavirus 2 by RT PCR NEGATIVE NEGATIVE Final    Comment: (NOTE) SARS-CoV-2 target nucleic acids are NOT DETECTED.  The SARS-CoV-2 RNA is generally detectable in upper respiratory specimens during the acute phase of infection. The lowest concentration of SARS-CoV-2 viral copies this assay can detect is 138 copies/mL. A negative result does not preclude SARS-Cov-2 infection and should not be used as the sole basis for treatment or other patient management decisions. A negative result may occur with  improper specimen collection/handling, submission of specimen other than nasopharyngeal swab, presence of viral mutation(s) within the areas targeted by this assay, and inadequate number of viral copies(<138 copies/mL). A negative result must be combined with clinical observations, patient history, and epidemiological information. The expected result is Negative.  Fact Sheet for Patients:  BloggerCourse.com  Fact Sheet for Healthcare  Providers:  SeriousBroker.it  This test is no t yet approved or cleared by the [place] FDA and  has been authorized for detection and/or diagnosis of SARS-CoV-2 by FDA under an Emergency Use Authorization (EUA). This EUA will remain  in effect (meaning this test can be used) for the duration of the COVID-19 declaration under Section 564(b)(1) of the Act, 21 U.S.C.section 360bbb-3(b)(1), unless the authorization is terminated  or revoked sooner.       Influenza A by PCR NEGATIVE NEGATIVE Final   Influenza B by PCR NEGATIVE NEGATIVE Final    Comment: (NOTE) The Xpert Xpress SARS-CoV-2/FLU/RSV plus assay is intended as an aid in the diagnosis of influenza from Nasopharyngeal swab specimens and should not be used as a sole basis for treatment. Nasal washings and aspirates are unacceptable for Xpert Xpress SARS-CoV-2/FLU/RSV testing.  Fact Sheet for Patients: BloggerCourse.com  Fact Sheet for Healthcare Providers: SeriousBroker.it  This test is not yet approved or cleared by the [place] FDA and has been authorized for detection and/or diagnosis of SARS-CoV-2 by FDA under an Emergency Use Authorization (EUA). This EUA will remain in effect (meaning this test can be used) for the duration of the COVID-19 declaration under Section 564(b)(1) of the Act, 21 U.S.C. section 360bbb-3(b)(1), unless the authorization is terminated or revoked.     Resp Syncytial Virus by PCR NEGATIVE NEGATIVE Final    Comment: (NOTE) Fact Sheet for Patients: BloggerCourse.com  Fact Sheet for Healthcare Providers: SeriousBroker.it  This test is not yet approved or cleared by the [place] FDA and has been authorized for detection and/or diagnosis of SARS-CoV-2 by FDA under an Emergency Use Authorization (EUA). This EUA will remain in effect (meaning this test can be  used) for the duration of the COVID-19 declaration under Section 564(b)(1) of the  Act, 21 U.S.C. section 360bbb-3(b)(1), unless the authorization is terminated or revoked.  Performed at [facility], [address] [place], [place] [address]      Radiological Exams on Admission: DG Chest 2 View Result Date: 07/25/2023 CLINICAL DATA:  Shortness of breath.  Dyspnea. EXAM: CHEST - 2 VIEW COMPARISON:  06/27/2023 FINDINGS: Normal sized heart. Normal pulmonary vascularity. The lungs remain hyperexpanded with interval mild diffuse peribronchial thickening. Mild patchy density at the posterior lower lung zones on the lateral view. No pleural fluid seen. Diffuse osteopenia. IMPRESSION: 1. Interval mild diffuse bronchitic changes. 2. Mild patchy density at the posterior lower lung zones on the lateral view, which could represent atelectasis or pneumonia. 3. Stable changes of COPD. Electronically Signed   By: Beckie Salts M.D.   On: 07/25/2023 17:50    Assessment/Plan Present on Admission:  Acute respiratory failure with hypoxia (HCC)  Community-acquired pneumonia -Pneumonia order set initiated -Blood cultures x 2, sputum cultures -Legionella, streptococcal urinary antigen -IV Rocephin and azithromycin ordered -DuoNebs as needed -Flutter valve   Acute COPD exacerbation, mild -IV prednisone x then p.o. -Nebulizes as needed   BPH with urinary obstruction -Flomax resumed   Moderate dementia (HCC) -Exelon patch   HLD -Crestor   Essential hypertension -Cozaar   Anxiety depression -Lexapro, Seroquel  Iliyah Bui 07/25/2023, 11:16 PM

## 2023-07-25 NOTE — ED Notes (Signed)
 This NA attempted to ambulate pt multiple times, but pt would raise his legs back onto bed. Family at bedside assisted this NA in encouraging pt to ambulate without success. RN will be notified.

## 2023-07-25 NOTE — ED Triage Notes (Signed)
 Pt was recently hospitalized for PNA. Today he was SOB with increased work of breathing. 1 duoneb given. 93% [place] 2 L. Normally on room air. Lungs sounds are diminished.

## 2023-07-25 NOTE — ED Provider Notes (Signed)
 [place] EMERGENCY DEPARTMENT AT [facility] Provider Note   CSN: [id number] Arrival date & time: 07/25/23  1441     History  Chief Complaint  Patient presents with   Shortness of Breath    Hunter Boone is a 78 y.o. male with history of dementia predominantly nonverbal, COPD not on oxygen presents accompanied by his wife with complaints of hypoxia and increased work of breathing.  States she went to visit him today at a care center.  He was irritable and was reported to have O2 sats in the 70s.  He is typically not on oxygen.  Reports that a few weeks ago he was treated for pneumonia.  He completed full course of amoxicillin.  His wife is concerned that this has recurred.  Shortness of Breath     Past Medical History:  Diagnosis Date   Aortic atherosclerosis (HCC)    chronic--- followed by pcp   BPH without obstruction/lower urinary tract symptoms    urologist--- dr bell   COPD with emphysema (HCC)    followed by pcp---  never used oxygen,  still smokes 1 ppd   Diverticulosis of colon    Full dentures    Heart murmur    per pt's pcp note 02-27-2020 in epic , she heard murmur   Hemorrhoids    History of adenomatous polyp of colon    History of concussion    per pt in [place] ,  no resiudal   History of elevated PSA    negative bx 2019   History of gastroesophageal reflux (GERD)    03-06-2020 per pt no issues for several yrs   History of pneumothorax 2017   traumatic due to fall   Hypertension    followed by pcp   Left inguinal hernia    Macular degeneration, left eye    Mild neurocognitive disorder    neruologist--- dr Karel Jarvis   Nodule of right lung    lower lobe--- followed by pcp--- last chest CT 05-07-2019 stable   Pre-diabetes    Wears glasses      Home Medications Prior to Admission medications   Medication Sig Start Date End Date Taking? Authorizing Provider  acetaminophen (TYLENOL) 325 MG tablet Take 2 tablets (650 mg total) by mouth  every 6 (six) hours as needed. 08/26/22  Yes Burns, Bobette Mo, MD  escitalopram (LEXAPRO) 10 MG tablet Take 1 tablet (10 mg total) by mouth daily. 06/26/22  Yes Burns, Bobette Mo, MD  furosemide (LASIX) 20 MG tablet Take 1 tablet (20 mg total) by mouth daily. 04/29/22  Yes Burns, Bobette Mo, MD  guaiFENesin (MUCINEX) 600 MG 12 hr tablet Take 1 tablet (600 mg total) by mouth 2 (two) times daily as needed. 04/29/23  Yes Burns, Bobette Mo, MD  ipratropium (ATROVENT) 0.03 % nasal spray Place 2 sprays into both nostrils every 12 (twelve) hours. 06/27/23  Yes Burns, Bobette Mo, MD  losartan (COZAAR) 100 MG tablet TAKE 1 TABLET BY MOUTH  DAILY 06/04/21  Yes Burns, Bobette Mo, MD  Multiple Vitamins-Minerals (CENTRUM PO) Take 1 tablet by mouth daily.   Yes [provider]  QUEtiapine (SEROQUEL) 100 MG tablet Take 1 tablet (100 mg total) by mouth at bedtime. 07/16/22  Yes Burns, Bobette Mo, MD  QUEtiapine (SEROQUEL) 25 MG tablet Take one tab daily at 4 pm 10/18/22  Yes Burns, Bobette Mo, MD  rosuvastatin (CRESTOR) 5 MG tablet TAKE 1 TABLET BY MOUTH  DAILY 06/04/21  Yes Cheryll Cockayne  J, MD  tamsulosin (FLOMAX) 0.4 MG CAPS capsule TAKE 1 CAPSULE BY MOUTH DAILY 10/25/22  Yes Burns, Bobette Mo, MD  vitamin C (ASCORBIC ACID) 500 MG tablet Take 500 mg by mouth daily.   Yes [provider]  Zinc Oxide (BAZA PROTECT MOISTURE BARRIER) 12 % CREA Apply 1 Application topically 2 (two) times daily.   Yes [provider]  Multiple Vitamins-Minerals (PRESERVISION AREDS 2 PO) Take 1 capsule by mouth every morning.     [provider]  rivastigmine (EXELON) 1.5 MG capsule Take 1 capsule at night for  1 week and then one capsule 2 times a day 12/31/21   Marcos Eke, PA-C      Allergies    Namenda [memantine]    Review of Systems   Review of Systems  Respiratory:  Positive for shortness of breath.     Physical Exam Updated Vital Signs BP 119/67   Pulse 76   Temp 98 F (36.7 C) (Axillary)   Resp (!) 24   SpO2  91%  Physical Exam Vitals and nursing note reviewed.  Constitutional:      General: He is not in acute distress.    Appearance: He is well-developed.  HENT:     Head: Normocephalic and atraumatic.  Eyes:     Conjunctiva/sclera: Conjunctivae normal.  Cardiovascular:     Rate and Rhythm: Normal rate and regular rhythm.     Heart sounds: Murmur heard.  Pulmonary:     Effort: Pulmonary effort is normal. No respiratory distress.     Breath sounds: Wheezing and rhonchi present. No decreased breath sounds or rales.  Abdominal:     Palpations: Abdomen is soft.     Tenderness: There is no abdominal tenderness. There is no rebound.  Genitourinary:    Comments: Stage 1 decubitus pressure ulcer without warmth or drainage Musculoskeletal:        General: No swelling.     Cervical back: Neck supple.     Right lower leg: No edema.     Left lower leg: No edema.  Skin:    General: Skin is warm and dry.     Capillary Refill: Capillary refill takes less than 2 seconds.  Neurological:     General: No focal deficit present.     Mental Status: He is alert.     Comments: Patient is reported to be at his baseline per his wife  Psychiatric:        Mood and Affect: Mood normal.       ED Results / Procedures / Treatments   Labs (all labs ordered are listed, but only abnormal results are displayed) Labs Reviewed  BRAIN NATRIURETIC PEPTIDE - Abnormal; Notable for the following components:      Result Value   B Natriuretic Peptide 112.6 (*)    All other components within normal limits  CBC WITH DIFFERENTIAL/PLATELET - Abnormal; Notable for the following components:   WBC 11.7 (*)    RBC 3.96 (*)    Hemoglobin 11.6 (*)    HCT 36.3 (*)    Neutro Abs 8.8 (*)    Monocytes Absolute 1.8 (*)    Abs Immature Granulocytes 0.16 (*)    All other components within normal limits  BASIC METABOLIC PANEL - Abnormal; Notable for the following components:   Potassium 3.2 (*)    Glucose, Bld 136 (*)     Calcium 8.7 (*)    All other components within normal limits  TROPONIN I (HIGH  SENSITIVITY) - Abnormal; Notable for the following components:   Troponin I (High Sensitivity) 24 (*)    All other components within normal limits  TROPONIN I (HIGH SENSITIVITY) - Abnormal; Notable for the following components:   Troponin I (High Sensitivity) 23 (*)    All other components within normal limits  RESP PANEL BY RT-PCR (RSV, FLU A&B, COVID)  RVPGX2  EXPECTORATED SPUTUM ASSESSMENT W GRAM STAIN, RFLX TO RESP C  MAGNESIUM  URINALYSIS, ROUTINE W REFLEX MICROSCOPIC  OCCULT BLOOD X 1 CARD TO LAB, STOOL  BASIC METABOLIC PANEL  CBC WITH DIFFERENTIAL/PLATELET  LEGIONELLA PNEUMOPHILA SEROGP 1 UR AG  STREP PNEUMONIAE URINARY ANTIGEN  POC OCCULT BLOOD, ED    EKG EKG Interpretation Date/Time:  Monday July 25 2023 18:56:46 EST Ventricular Rate:  76 PR Interval:  164 QRS Duration:  81 QT Interval:  353 QTC Calculation: 397 R Axis:   28  Text Interpretation: Sinus rhythm Anteroseptal infarct, old Repol abnrm suggests ischemia, anterolateral Confirmed by Bethann Berkshire [phone]) on 07/25/2023 10:08:22 PM  Radiology DG Chest 2 View Result Date: 07/25/2023 CLINICAL DATA:  Shortness of breath.  Dyspnea. EXAM: CHEST - 2 VIEW COMPARISON:  06/27/2023 FINDINGS: Normal sized heart. Normal pulmonary vascularity. The lungs remain hyperexpanded with interval mild diffuse peribronchial thickening. Mild patchy density at the posterior lower lung zones on the lateral view. No pleural fluid seen. Diffuse osteopenia. IMPRESSION: 1. Interval mild diffuse bronchitic changes. 2. Mild patchy density at the posterior lower lung zones on the lateral view, which could represent atelectasis or pneumonia. 3. Stable changes of COPD. Electronically Signed   By: Beckie Salts M.D.   On: 07/25/2023 17:50    Procedures Procedures    Medications Ordered in ED Medications  potassium chloride SA (KLOR-CON M) CR tablet 40 mEq (40 mEq  Oral Not Given 07/25/23 1913)  sodium chloride 0.9 % bolus 500 mL (has no administration in time range)  enoxaparin (LOVENOX) injection 40 mg (has no administration in time range)  acetaminophen (TYLENOL) tablet 650 mg (has no administration in time range)    Or  acetaminophen (TYLENOL) suppository 650 mg (has no administration in time range)  senna-docusate (Senokot-S) tablet 1 tablet (has no administration in time range)  ondansetron (ZOFRAN) tablet 4 mg (has no administration in time range)    Or  ondansetron (ZOFRAN) injection 4 mg (has no administration in time range)  cefTRIAXone (ROCEPHIN) 2 g in sodium chloride 0.9 % 100 mL IVPB (has no administration in time range)  azithromycin (ZITHROMAX) 500 mg in sodium chloride 0.9 % 250 mL IVPB (has no administration in time range)  guaiFENesin (ROBITUSSIN) 100 MG/5ML liquid 5 mL (has no administration in time range)  albuterol (PROVENTIL) (2.5 MG/3ML) 0.083% nebulizer solution 2.5 mg (has no administration in time range)  ipratropium-albuterol (DUONEB) 0.5-2.5 (3) MG/3ML nebulizer solution 3 mL (3 mLs Nebulization Given 07/25/23 1622)  methylPREDNISolone sodium succinate (SOLU-MEDROL) 125 mg/2 mL injection 125 mg (125 mg Intravenous Given 07/25/23 1622)    ED Course/ Medical Decision Making/ A&P Clinical Course as of 07/26/23 0041  Mon Jul 25, 2023  1857 Troponin I (High Sensitivity)(!) [JT]    Clinical Course User Index [JT] Halford Decamp, PA-C                                 Medical Decision Making  This patient presents to the ED with chief complaint(s) of hypoxia .  The  complaint involves an extensive differential diagnosis and also carries with it a high risk of complications and morbidity.   pertinent past medical history as listed in HPI  The differential diagnosis includes  ACS, PE, pneumonia, pneumothorax, URI, asthma, COPD exacerbation, CHF  The initial plan is to  Will start with basic labs, EKG, chest  x-ray Additional history obtained: Additional history obtained from family Records reviewed previous admission documents, Care Everywhere/External Records, and Primary Care Documents  Initial Assessment:   Patient presents tachypneic and tachycardic to 102, normotensive with complaints of irritability and hypoxia earlier at his care center.  He is satting 90% now on 2 L.  He does have a history of COPD without use of oxygen at home.  Has had a productive cough recently, and was treated for pneumonia few weeks back.  He has diminished lung sounds on the left with diffuse wheezes and rhonchi.  Following nebs, wheezing improved and he did have mild left lower lobe Rales.  Patient has dementia and is predominantly nonverbal at baseline.  He does not express any personal complaints. His wife is concerned as there was reported blood in his diaper from an unknown source.  Hemoccult was negative.  He does have a stage 1 sacral decubitus ulcer without evidence of infection. His perineum is without evidence of abnormality. He does have some mild excoriations on the inside of his thigh without bleeding. Scrotum is unremarkable. He does have a systolic murmur that his wife denies any knowledge of. Unclear whether this is new. His BNP is mildly elevated. Do not suspect his hypoxia to be secondary to fluid overload. Trop is mildly elevated, no EKG changes.  Independent ECG interpretation:  Sinus tachycardia  Independent labs interpretation:  The following labs were independently interpreted:  CBC with mild leukocytosis of 11.7, BMP mildly elevated at 112.6, respiratory panel negative, BMP with mild hypokalemia of 3.2, mag of 2 , troponin mildly elevated 24  Independent visualization and interpretation of imaging: I independently visualized the following imaging with scope of interpretation limited to determining acute life threatening conditions related to emergency care: Chest x-ray, which revealed mild diffuse  bronchitic changes, patchy density on posterior left lower lung which could represent pneumonia or atelectasis  Treatment and Reassessment: Patient given DuoNeb and Solu-Medrol following first assessment  Upon reevaluation patient is resting, he is no longer wheezing, he does have very faint left lower lobe Rales.  Systolic murmur noted as well.  He does have a mildly elevated BNP.   Patient denies ambulation, however without oxygen at rest patient dropped down to 84%.  Will pursue admission.  Consultations obtained:   Hospitalist: Patient will be admitted for hypoxia   Disposition:   Patient will be admitted for hypoxia in the setting on pneumonia and COPD exacerbation.  Social Determinants of Health:   None  This note was dictated with voice recognition software.  Despite best efforts at proofreading, errors may have occurred which can change the documentation meaning.           Final Clinical Impression(s) / ED Diagnoses Final diagnoses:  Hypoxia  Pneumonia of left lower lobe due to infectious organism    Rx / DC Orders ED Discharge Orders     None         Fabienne Bruns 07/26/23 0041    Bethann Berkshire, MD 07/27/23 1324

## 2023-07-26 DIAGNOSIS — J9601 Acute respiratory failure with hypoxia: Secondary | ICD-10-CM | POA: Diagnosis not present

## 2023-07-26 LAB — STREP PNEUMONIAE URINARY ANTIGEN: Strep Pneumo Urinary Antigen: NEGATIVE

## 2023-07-26 LAB — CBC WITH DIFFERENTIAL/PLATELET
Abs Immature Granulocytes: 0.06 10*3/uL (ref 0.00–0.07)
Basophils Absolute: 0 10*3/uL (ref 0.0–0.1)
Basophils Relative: 0 %
Eosinophils Absolute: 0 10*3/uL (ref 0.0–0.5)
Eosinophils Relative: 0 %
HCT: 40.3 % (ref 39.0–52.0)
Hemoglobin: 12.6 g/dL — ABNORMAL LOW (ref 13.0–17.0)
Immature Granulocytes: 0 %
Lymphocytes Relative: 10 %
Lymphs Abs: 1.6 10*3/uL (ref 0.7–4.0)
MCH: 29.1 pg (ref 26.0–34.0)
MCHC: 31.3 g/dL (ref 30.0–36.0)
MCV: 93.1 fL (ref 80.0–100.0)
Monocytes Absolute: 1.7 10*3/uL — ABNORMAL HIGH (ref 0.1–1.0)
Monocytes Relative: 10 %
Neutro Abs: 13.2 10*3/uL — ABNORMAL HIGH (ref 1.7–7.7)
Neutrophils Relative %: 80 %
Platelets: 264 10*3/uL (ref 150–400)
RBC: 4.33 MIL/uL (ref 4.22–5.81)
RDW: 15.7 % — ABNORMAL HIGH (ref 11.5–15.5)
WBC: 16.6 10*3/uL — ABNORMAL HIGH (ref 4.0–10.5)
nRBC: 0 % (ref 0.0–0.2)

## 2023-07-26 LAB — BASIC METABOLIC PANEL
Anion gap: 15 (ref 5–15)
BUN: 24 mg/dL — ABNORMAL HIGH (ref 8–23)
CO2: 24 mmol/L (ref 22–32)
Calcium: 9.3 mg/dL (ref 8.9–10.3)
Chloride: 108 mmol/L (ref 98–111)
Creatinine, Ser: 0.99 mg/dL (ref 0.61–1.24)
GFR, Estimated: >60 mL/min (ref >60)
Glucose, Bld: 127 mg/dL — ABNORMAL HIGH (ref 70–99)
Potassium: 3.2 mmol/L — ABNORMAL LOW (ref 3.5–5.1)
Sodium: 147 mmol/L — ABNORMAL HIGH (ref 135–145)

## 2023-07-26 LAB — URINALYSIS, ROUTINE W REFLEX MICROSCOPIC
Bilirubin Urine: NEGATIVE
Glucose, UA: NEGATIVE mg/dL
Hgb urine dipstick: NEGATIVE
Ketones, ur: NEGATIVE mg/dL
Leukocytes,Ua: NEGATIVE
Nitrite: NEGATIVE
Protein, ur: 100 mg/dL — AB
Specific Gravity, Urine: 1.027 (ref 1.005–1.030)
pH: 5 (ref 5.0–8.0)

## 2023-07-26 LAB — MRSA NEXT GEN BY PCR, NASAL: MRSA by PCR Next Gen: NOT DETECTED

## 2023-07-26 MED ORDER — OLANZAPINE 5 MG PO TBDP
5.0000 mg | ORAL_TABLET | Freq: Every day | ORAL | Status: DC
  Administered 2023-07-26 – 2023-07-27 (×2): 5 mg via ORAL
  Filled 2023-07-26 (×2): qty 1

## 2023-07-26 MED ORDER — POTASSIUM CHLORIDE CRYS ER 20 MEQ PO TBCR
40.0000 meq | EXTENDED_RELEASE_TABLET | ORAL | Status: AC
  Administered 2023-07-26 (×2): 40 meq via ORAL
  Filled 2023-07-26: qty 4
  Filled 2023-07-26: qty 2

## 2023-07-26 NOTE — ED Notes (Signed)
Unable to obtain urine, patient keeps pulling off leads.

## 2023-07-26 NOTE — Progress Notes (Signed)
  Progress Note   Patient: Hunter Boone ZOX:[id number] DOB: 12-Dec-1945 DOA: 07/25/2023     1 DOS: the patient was seen and examined on 07/26/2023   Brief hospital course: The patient is a 78 yr old man who was sent to the [facility] ED by the memory care institution where he resides after he was found to be hypoxic with an SaO2 of 70%. He was complaining of being short of breath. The patient recently received treatment for pneumonia.   In the ED the patient was found to have a potassium of 3.2, BNP of 126, and a WBC of 11.7. CXR demonstrated interval diffuse bronchitic changes and a mild patchy density in the at the posterior lower lung zones possibly representing pneumonia. There were also stable changes consistent with COPD. Cepheid panel was negative.  The patient's spouse confirms that the patient is a DNR/DNI.  Assessment and Plan: Present on Admission: Acute respiratory failure with hypoxia (HCC) Community-acquired pneumonia with bronchitis -Pneumonia order set initiated -Blood cultures x 2, sputum cultures -Legionella, streptococcal urinary antigen -IV Rocephin and azithromycin ordered IV steroids. -DuoNebs as needed -Flutter valve   Acute COPD exacerbation, mild -IV prednisone  -Nebulizes as needed   BPH with urinary obstruction -Flomax resumed   Moderate dementia (HCC) -Exelon patch   HLD -Crestor   Essential hypertension -Cozaar   Anxiety depression -Lexapro, Seroquel -Zyprexa and a sitter for agitation.  Pressure wound of the right Cocccyx: Wound care has been consulted.  Subjective: The patient is pleasantly confused. His wife is at bedside.  Physical Exam: Vitals:   07/26/23 1407 07/26/23 1545 07/26/23 1800 07/26/23 1945  BP: 112/86 116/66 (!) 110/55 115/67  Pulse: (!) 53 66 64 72  Resp: 16 17 18 20   Temp: 97.9 F (36.6 C) 97.8 F (36.6 C) 97.6 F (36.4 C) 98 F (36.7 C)  TempSrc: Oral Oral  Oral  SpO2: 100% 100% 98% 96%   Exam:  Constitutional:   The patient is awake, alert, and confused. He is refusing to keep his oxygen on. ERespiratory:  Positive for increased work of breathing. Positive for rales,, wheezes and rhonchi No tactile fremitus Cardiovascular:  Regular rate and rhythm No murmurs, ectopy, or gallups. No lateral PMI. No thrills. Abdomen:  Abdomen is soft, non-tender, non-distended No hernias, masses, or organomegaly Normoactive bowel sounds.  Musculoskeletal:  No cyanosis, clubbing, or edema Skin:  No rashes, lesions, ulcers palpation of skin: no induration or nodules Neurologic:  The patient is not able to cooperate with exam. Psychiatric:  The patient is not capable of cooperating with exam.  Data Reviewed:  CXR,, CBC, BMP  Family Communication: Wife is at bedside.  Disposition: Status is: Inpatient Remains inpatient appropriate because: Severe respiratory illness.  Planned Discharge Destination:  Return to memory care facility.    Time spent: 36 minutes  Author: Sukaina Toothaker, DO 07/26/2023 8:05 PM  For on call review [email or web address].

## 2023-07-26 NOTE — ED Notes (Signed)
Fed patient his breakfast.

## 2023-07-26 NOTE — Plan of Care (Signed)

## 2023-07-26 NOTE — ED Notes (Signed)
 ED TO INPATIENT HANDOFF REPORT  ED Nurse Name and Phone #: Pennelope Bracken EMTP  S Name/Age/Gender Hunter Boone 78 y.o. male Room/Bed: WA05/WA05  Code Status   Code Status: Full Code  Home/SNF/Other Home Patient oriented to: self Is this baseline? Yes   Triage Complete: Triage complete  Chief Complaint Acute respiratory failure with hypoxia (HCC) [J96.01]  Triage Note Pt was recently hospitalized for PNA. Today he was SOB with increased work of breathing. 1 duoneb given. 93% [place] 2 L. Normally on room air. Lungs sounds are diminished.    Allergies Allergies  Allergen Reactions   Namenda [Memantine] Other (See Comments)    Increased confusion    Level of Care/Admitting Diagnosis ED Disposition     ED Disposition  Admit   Condition  --   Comment  Hospital Area: [facility] [[id number]]  Level of Care: Med-Surg [16]  May admit patient to [facility] or [facility] if equivalent level of care is available:: Yes  Covid Evaluation: Confirmed COVID Negative  Diagnosis: Acute respiratory failure with hypoxia [facility]) [[id number]]  Admitting Physician: Gery Pray [[id number]]  Attending Physician: Gery Pray [[id number]]  Certification:: I certify this patient will need inpatient services for at least 2 midnights  Expected Medical Readiness: 07/27/2023          B Medical/Surgery History Past Medical History:  Diagnosis Date   Aortic atherosclerosis (HCC)    chronic--- followed by pcp   BPH without obstruction/lower urinary tract symptoms    urologist--- dr bell   COPD with emphysema (HCC)    followed by pcp---  never used oxygen,  still smokes 1 ppd   Diverticulosis of colon    Full dentures    Heart murmur    per pt's pcp note 02-27-2020 in epic , she heard murmur   Hemorrhoids    History of adenomatous polyp of colon    History of concussion    per pt in [place] ,  no resiudal   History of elevated PSA    negative bx 2019   History of gastroesophageal  reflux (GERD)    03-06-2020 per pt no issues for several yrs   History of pneumothorax 2017   traumatic due to fall   Hypertension    followed by pcp   Left inguinal hernia    Macular degeneration, left eye    Mild neurocognitive disorder    neruologist--- dr Karel Jarvis   Nodule of right lung    lower lobe--- followed by pcp--- last chest CT 05-07-2019 stable   Pre-diabetes    Wears glasses    Past Surgical History:  Procedure Laterality Date   COLONOSCOPY  last one 04-20-2016   EVALUATION UNDER ANESTHESIA WITH HEMORRHOIDECTOMY N/A 07/01/2017   Procedure: ANORECTAL EXAMINATION UNDER ANESTHESIA, HEMORRHOIDECTOMY, HEMORRHOIDAL LIGATION/PEXY;  Surgeon: Karie Soda, MD;  Location: [facility];  Service: General;  Laterality: N/A;  GENERAL AND LOCAL   INGUINAL HERNIA REPAIR N/A 03/12/2020   Procedure: LAPARSCOPIC LEFT INGUINAL HERNIA REPAIR; BILATERAL FEMORAL HERNIA REPAIR; BILATERAL TAP BLOCK;  Surgeon: Karie Soda, MD;  Location: [facility];  Service: General;  Laterality: N/A;   LAPAROSCOPIC ABDOMINAL EXPLORATION N/A 03/12/2020   Procedure: LAPAROSCOPIC  EXPLORATION AND REPAIRS OF HERNIAS FOUND;  Surgeon: Karie Soda, MD;  Location: [facility];  Service: General;  Laterality: N/A;   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   Procedure: TOTAL HIP ARTHROPLASTY ANTERIOR APPROACH;  Surgeon: Samson Frederic, MD;  Location: [facility];  Service:  Orthopedics;  Laterality: Left;     A IV Location/Drains/Wounds Patient Lines/Drains/Airways Status     Active Line/Drains/Airways     Name Placement date Placement time Site Days   Peripheral IV 07/25/23 20 G Anterior;Left Forearm 07/25/23  1522  Forearm  1   Incision (Closed) 12/19/18 Hip Left 12/19/18  1328  -- 1680   Incision (Closed) 03/12/20 Abdomen 03/12/20  1336  -- 1231   Incision - 3 Ports Abdomen Upper;Left Upper;Mid Upper;Right 03/12/20  1207  -- 1231            Intake/Output Last 24 hours  Intake/Output  Summary (Last 24 hours) at 07/26/2023 1218 Last data filed at 07/26/2023 0219 Gross per 24 hour  Intake 600 ml  Output --  Net 600 ml    Labs/Imaging Results for orders placed or performed during the hospital encounter of 07/25/23 (from the past 48 hours)  Brain natriuretic peptide     Status: Abnormal   Collection Time: 07/25/23  4:14 PM  Result Value Ref Range   B Natriuretic Peptide 112.6 (H) 0.0 - 100.0 pg/mL    Comment: Performed at [facility], [address] [place], [place] [address]  CBC with Differential     Status: Abnormal   Collection Time: 07/25/23  4:14 PM  Result Value Ref Range   WBC 11.7 (H) 4.0 - 10.5 K/uL   RBC 3.96 (L) 4.22 - 5.81 MIL/uL   Hemoglobin 11.6 (L) 13.0 - 17.0 g/dL   HCT 19.1 (L) 47.8 - 29.5 %   MCV 91.7 80.0 - 100.0 fL   MCH 29.3 26.0 - 34.0 pg   MCHC 32.0 30.0 - 36.0 g/dL   RDW 62.1 30.8 - 65.7 %   Platelets 209 150 - 400 K/uL   nRBC 0.0 0.0 - 0.2 %   Neutrophils Relative % 76 %   Neutro Abs 8.8 (H) 1.7 - 7.7 K/uL   Lymphocytes Relative 8 %   Lymphs Abs 0.9 0.7 - 4.0 K/uL   Monocytes Relative 15 %   Monocytes Absolute 1.8 (H) 0.1 - 1.0 K/uL   Eosinophils Relative 0 %   Eosinophils Absolute 0.0 0.0 - 0.5 K/uL   Basophils Relative 0 %   Basophils Absolute 0.0 0.0 - 0.1 K/uL   Immature Granulocytes 1 %   Abs Immature Granulocytes 0.16 (H) 0.00 - 0.07 K/uL    Comment: Performed at [facility], [address] [place], [place] [address]  Basic metabolic panel     Status: Abnormal   Collection Time: 07/25/23  4:14 PM  Result Value Ref Range   Sodium 140 135 - 145 mmol/L   Potassium 3.2 (L) 3.5 - 5.1 mmol/L   Chloride 104 98 - 111 mmol/L   CO2 26 22 - 32 mmol/L   Glucose, Bld 136 (H) 70 - 99 mg/dL    Comment: Glucose reference range applies only to samples taken after fasting for at least 8 hours.   BUN 20 8 - 23 mg/dL   Creatinine, Ser 2.95 0.61 - 1.24 mg/dL   Calcium 8.7 (L) 8.9 - 10.3 mg/dL    GFR, Estimated >28 >41 mL/min    Comment: (NOTE) Calculated using the CKD-EPI Creatinine Equation (2021)    Anion gap 10 5 - 15    Comment: Performed at [facility], [address], [place], [place] [address]  Resp panel by RT-PCR (RSV, Flu A&B, Covid) Anterior Nasal Swab     Status: None  Collection Time: 07/25/23  4:14 PM   Specimen: Anterior Nasal Swab  Result Value Ref Range   SARS Coronavirus 2 by RT PCR NEGATIVE NEGATIVE    Comment: (NOTE) SARS-CoV-2 target nucleic acids are NOT DETECTED.  The SARS-CoV-2 RNA is generally detectable in upper respiratory specimens during the acute phase of infection. The lowest concentration of SARS-CoV-2 viral copies this assay can detect is 138 copies/mL. A negative result does not preclude SARS-Cov-2 infection and should not be used as the sole basis for treatment or other patient management decisions. A negative result may occur with  improper specimen collection/handling, submission of specimen other than nasopharyngeal swab, presence of viral mutation(s) within the areas targeted by this assay, and inadequate number of viral copies(<138 copies/mL). A negative result must be combined with clinical observations, patient history, and epidemiological information. The expected result is Negative.  Fact Sheet for Patients:  BloggerCourse.com  Fact Sheet for Healthcare Providers:  SeriousBroker.it  This test is no t yet approved or cleared by the [place] FDA and  has been authorized for detection and/or diagnosis of SARS-CoV-2 by FDA under an Emergency Use Authorization (EUA). This EUA will remain  in effect (meaning this test can be used) for the duration of the COVID-19 declaration under Section 564(b)(1) of the Act, 21 U.S.C.section 360bbb-3(b)(1), unless the authorization is terminated  or revoked sooner.       Influenza A by PCR NEGATIVE NEGATIVE    Influenza B by PCR NEGATIVE NEGATIVE    Comment: (NOTE) The Xpert Xpress SARS-CoV-2/FLU/RSV plus assay is intended as an aid in the diagnosis of influenza from Nasopharyngeal swab specimens and should not be used as a sole basis for treatment. Nasal washings and aspirates are unacceptable for Xpert Xpress SARS-CoV-2/FLU/RSV testing.  Fact Sheet for Patients: BloggerCourse.com  Fact Sheet for Healthcare Providers: SeriousBroker.it  This test is not yet approved or cleared by the [place] FDA and has been authorized for detection and/or diagnosis of SARS-CoV-2 by FDA under an Emergency Use Authorization (EUA). This EUA will remain in effect (meaning this test can be used) for the duration of the COVID-19 declaration under Section 564(b)(1) of the Act, 21 U.S.C. section 360bbb-3(b)(1), unless the authorization is terminated or revoked.     Resp Syncytial Virus by PCR NEGATIVE NEGATIVE    Comment: (NOTE) Fact Sheet for Patients: BloggerCourse.com  Fact Sheet for Healthcare Providers: SeriousBroker.it  This test is not yet approved or cleared by the [place] FDA and has been authorized for detection and/or diagnosis of SARS-CoV-2 by FDA under an Emergency Use Authorization (EUA). This EUA will remain in effect (meaning this test can be used) for the duration of the COVID-19 declaration under Section 564(b)(1) of the Act, 21 U.S.C. section 360bbb-3(b)(1), unless the authorization is terminated or revoked.  Performed at [facility], [address] [place], [place] [address]   Troponin I (High Sensitivity)     Status: Abnormal   Collection Time: 07/25/23  4:14 PM  Result Value Ref Range   Troponin I (High Sensitivity) 24 (H) <18 ng/L    Comment: (NOTE) Elevated high sensitivity troponin I (hsTnI) values and significant  changes across serial  measurements may suggest ACS but many other  chronic and acute conditions are known to elevate hsTnI results.  Refer to the "Links" section for chest pain algorithms and additional  guidance. Performed at [facility], [address] [place], [place] [address]   Troponin I (High Sensitivity)  Status: Abnormal   Collection Time: 07/25/23  8:21 PM  Result Value Ref Range   Troponin I (High Sensitivity) 23 (H) <18 ng/L    Comment: (NOTE) Elevated high sensitivity troponin I (hsTnI) values and significant  changes across serial measurements may suggest ACS but many other  chronic and acute conditions are known to elevate hsTnI results.  Refer to the "Links" section for chest pain algorithms and additional  guidance. Performed at [facility], [address], [place], [place] [address]   Magnesium     Status: None   Collection Time: 07/25/23  8:21 PM  Result Value Ref Range   Magnesium 2.0 1.7 - 2.4 mg/dL    Comment: Performed at [facility], [address] [place], [place] [address]  Basic metabolic panel     Status: Abnormal   Collection Time: 07/26/23  9:03 AM  Result Value Ref Range   Sodium 147 (H) 135 - 145 mmol/L   Potassium 3.2 (L) 3.5 - 5.1 mmol/L   Chloride 108 98 - 111 mmol/L   CO2 24 22 - 32 mmol/L   Glucose, Bld 127 (H) 70 - 99 mg/dL    Comment: Glucose reference range applies only to samples taken after fasting for at least 8 hours.   BUN 24 (H) 8 - 23 mg/dL   Creatinine, Ser 3.24 0.61 - 1.24 mg/dL   Calcium 9.3 8.9 - 40.1 mg/dL   GFR, Estimated >02 >72 mL/min    Comment: (NOTE) Calculated using the CKD-EPI Creatinine Equation (2021)    Anion gap 15 5 - 15    Comment: Performed at [facility], [address], [place], [place] [address]   DG Chest 2 View Result Date: 07/25/2023 CLINICAL DATA:  Shortness of breath.  Dyspnea. EXAM: CHEST - 2 VIEW COMPARISON:  06/27/2023 FINDINGS:  Normal sized heart. Normal pulmonary vascularity. The lungs remain hyperexpanded with interval mild diffuse peribronchial thickening. Mild patchy density at the posterior lower lung zones on the lateral view. No pleural fluid seen. Diffuse osteopenia. IMPRESSION: 1. Interval mild diffuse bronchitic changes. 2. Mild patchy density at the posterior lower lung zones on the lateral view, which could represent atelectasis or pneumonia. 3. Stable changes of COPD. Electronically Signed   By: Beckie Salts M.D.   On: 07/25/2023 17:50    Pending Labs Unresulted Labs (From admission, onward)     Start     Ordered   08/01/23 0500  Creatinine, serum  (enoxaparin (LOVENOX)    CrCl >/= 30 ml/min)  Weekly,   R     Comments: while on enoxaparin therapy    07/25/23 2306   07/26/23 1015  CBC with Differential/Platelet  Once,   R        07/26/23 1015   07/26/23 0500  CBC with Differential/Platelet  Tomorrow morning,   R        07/25/23 2306   07/25/23 2324  Strep pneumoniae urinary antigen  Once,   R        07/25/23 2323   07/25/23 2323  Expectorated Sputum Assessment w Gram Stain, Rflx to Resp Cult  Once,   R        07/25/23 2323   07/25/23 2323  Legionella Pneumophila Serogp 1 Ur Ag  Once,   R        07/25/23 2323   07/25/23 1715  Occult blood card to lab, stool  Once,   URGENT  07/25/23 1714   07/25/23 1554  Urinalysis, Routine w reflex microscopic -Urine, Clean Catch  Once,   URGENT       Question:  Specimen Source  Answer:  Urine, Clean Catch   07/25/23 1554            Vitals/Pain Today's Vitals   07/26/23 0638 07/26/23 0945 07/26/23 1000 07/26/23 1025  BP:   111/70 (!) 110/53  Pulse:   95 64  Resp:  18 16 16   Temp: (!) 97.1 F (36.2 C)   98.1 F (36.7 C)  TempSrc: Axillary   Oral  SpO2:   92% 91%    Isolation Precautions No active isolations  Medications Medications  potassium chloride SA (KLOR-CON M) CR tablet 40 mEq (40 mEq Oral Not Given 07/25/23 1913)  enoxaparin  (LOVENOX) injection 40 mg (40 mg Subcutaneous Given 07/26/23 0918)  acetaminophen (TYLENOL) tablet 650 mg (has no administration in time range)    Or  acetaminophen (TYLENOL) suppository 650 mg (has no administration in time range)  senna-docusate (Senokot-S) tablet 1 tablet (has no administration in time range)  ondansetron (ZOFRAN) tablet 4 mg (has no administration in time range)    Or  ondansetron (ZOFRAN) injection 4 mg (has no administration in time range)  cefTRIAXone (ROCEPHIN) 2 g in sodium chloride 0.9 % 100 mL IVPB (0 g Intravenous Stopped 07/26/23 0219)  azithromycin (ZITHROMAX) 500 mg in sodium chloride 0.9 % 250 mL IVPB (0 mg Intravenous Stopped 07/26/23 0732)  guaiFENesin (ROBITUSSIN) 100 MG/5ML liquid 5 mL (has no administration in time range)  albuterol (PROVENTIL) (2.5 MG/3ML) 0.083% nebulizer solution 2.5 mg (has no administration in time range)  potassium chloride SA (KLOR-CON M) CR tablet 40 mEq (has no administration in time range)  ipratropium-albuterol (DUONEB) 0.5-2.5 (3) MG/3ML nebulizer solution 3 mL (3 mLs Nebulization Given 07/25/23 1622)  methylPREDNISolone sodium succinate (SOLU-MEDROL) 125 mg/2 mL injection 125 mg (125 mg Intravenous Given 07/25/23 1622)  sodium chloride 0.9 % bolus 500 mL (0 mLs Intravenous Stopped 07/26/23 0218)    Mobility walks with device     Focused Assessments See Chart   R Recommendations: See Admitting Provider Note  Report given to:

## 2023-07-26 NOTE — ED Notes (Signed)
RN asked this NA to check if pt has voided on himself, but pt has not voided.

## 2023-07-27 DIAGNOSIS — J9601 Acute respiratory failure with hypoxia: Secondary | ICD-10-CM | POA: Diagnosis not present

## 2023-07-27 DIAGNOSIS — J439 Emphysema, unspecified: Secondary | ICD-10-CM | POA: Diagnosis not present

## 2023-07-27 DIAGNOSIS — N401 Enlarged prostate with lower urinary tract symptoms: Secondary | ICD-10-CM | POA: Diagnosis not present

## 2023-07-27 DIAGNOSIS — J189 Pneumonia, unspecified organism: Secondary | ICD-10-CM | POA: Diagnosis not present

## 2023-07-27 LAB — BASIC METABOLIC PANEL
Anion gap: 10 (ref 5–15)
BUN: 29 mg/dL — ABNORMAL HIGH (ref 8–23)
CO2: 25 mmol/L (ref 22–32)
Calcium: 9.2 mg/dL (ref 8.9–10.3)
Chloride: 112 mmol/L — ABNORMAL HIGH (ref 98–111)
Creatinine, Ser: 0.93 mg/dL (ref 0.61–1.24)
GFR, Estimated: >60 mL/min (ref >60)
Glucose, Bld: 98 mg/dL (ref 70–99)
Potassium: 4.2 mmol/L (ref 3.5–5.1)
Sodium: 147 mmol/L — ABNORMAL HIGH (ref 135–145)

## 2023-07-27 LAB — CBC WITH DIFFERENTIAL/PLATELET
Abs Immature Granulocytes: 0.12 10*3/uL — ABNORMAL HIGH (ref 0.00–0.07)
Basophils Absolute: 0 10*3/uL (ref 0.0–0.1)
Basophils Relative: 0 %
Eosinophils Absolute: 0 10*3/uL (ref 0.0–0.5)
Eosinophils Relative: 0 %
HCT: 40.6 % (ref 39.0–52.0)
Hemoglobin: 12.7 g/dL — ABNORMAL LOW (ref 13.0–17.0)
Immature Granulocytes: 1 %
Lymphocytes Relative: 16 %
Lymphs Abs: 2.7 10*3/uL (ref 0.7–4.0)
MCH: 28.9 pg (ref 26.0–34.0)
MCHC: 31.3 g/dL (ref 30.0–36.0)
MCV: 92.5 fL (ref 80.0–100.0)
Monocytes Absolute: 1.5 10*3/uL — ABNORMAL HIGH (ref 0.1–1.0)
Monocytes Relative: 9 %
Neutro Abs: 12.3 10*3/uL — ABNORMAL HIGH (ref 1.7–7.7)
Neutrophils Relative %: 74 %
Platelets: 284 10*3/uL (ref 150–400)
RBC: 4.39 MIL/uL (ref 4.22–5.81)
RDW: 15.6 % — ABNORMAL HIGH (ref 11.5–15.5)
WBC: 16.7 10*3/uL — ABNORMAL HIGH (ref 4.0–10.5)
nRBC: 0 % (ref 0.0–0.2)

## 2023-07-27 LAB — LEGIONELLA PNEUMOPHILA SEROGP 1 UR AG: L. pneumophila Serogp 1 Ur Ag: NEGATIVE

## 2023-07-27 MED ORDER — DEXTROSE 5 % IV SOLN: INTRAVENOUS | Status: AC

## 2023-07-27 MED ORDER — POTASSIUM CHLORIDE CRYS ER 20 MEQ PO TBCR: 40.0000 meq | EXTENDED_RELEASE_TABLET | ORAL | Status: DC

## 2023-07-27 NOTE — Progress Notes (Addendum)
[facility]  PROGRESS NOTE  Hunter Boone XBJ:[id number] DOB: 1945-10-25 DOA: 07/25/2023 PCP: Hunter Sanes, MD   Brief HPI:   78 yr old man who was sent to the [facility] ED by the memory care institution where he resides after he was found to be hypoxic with an SaO2 of 70%. He was complaining of being short of breath. The patient recently received treatment for pneumonia.  In the ED the patient was found to have a potassium of 3.2, BNP of 126, and a WBC of 11.7. CXR demonstrated interval diffuse bronchitic changes and a mild patchy density in the at the posterior lower lung zones possibly representing pneumonia.    Assessment/Plan:   Acute respiratory failure with hypoxia (HCC) Community-acquired pneumonia with bronchitis -Improving -Oxygen has been weaned off to room air   Acute COPD exacerbation, mild -Continue as needed nebulizer   BPH with urinary obstruction -Flomax resumed   Moderate dementia (HCC) -Exelon patch   Hyperlipidemia -Crestor   Essential hypertension -Cozaar   Anxiety depression -Lexapro, Seroquel -Zyprexa and a sitter for agitation.  Hypernatremia -Mild elevation of sodium -Likely from poor p.o. intake -Start D5W at 50 mL/h  Hematuria -UA showed 25-50 RBCs per high-power field -CT scan from 2023 showed prostate enlargement with thickened bladder wall -Will need outpatient urology follow-up  Medications     enoxaparin (LOVENOX) injection  40 mg Subcutaneous Q24H   OLANZapine zydis  5 mg Oral QHS   potassium chloride  40 mEq Oral Once     Data Reviewed:   CBG:  No results for input(s): "GLUCAP" in the last 168 hours.  SpO2: 95 % O2 Flow Rate (L/min): 3 L/min    Vitals:   07/26/23 1800 07/26/23 1945 07/27/23 0323 07/27/23 1447  BP: (!) 110/55 115/67 (!) 143/81 (!) 153/89  Pulse: 64 72 (!) 55 64  Resp: 18 20 18 16   Temp: 97.6 F (36.4 C) 98 F (36.7 C)  (!) 97.4 F (36.3 C)  TempSrc:  Oral    SpO2: 98% 96% 93% 95%       Data Reviewed:  Basic Metabolic Panel: Recent Labs  Lab 07/25/23 1614 07/25/23 2021 07/26/23 0903 07/27/23 0924  NA 140  --  147* 147*  K 3.2*  --  3.2* 4.2  CL 104  --  108 112*  CO2 26  --  24 25  GLUCOSE 136*  --  127* 98  BUN 20  --  24* 29*  CREATININE 1.04  --  0.99 0.93  CALCIUM 8.7*  --  9.3 9.2  MG  --  2.0  --   --     CBC: Recent Labs  Lab 07/25/23 1614 07/26/23 1744 07/27/23 0924  WBC 11.7* 16.6* 16.7*  NEUTROABS 8.8* 13.2* 12.3*  HGB 11.6* 12.6* 12.7*  HCT 36.3* 40.3 40.6  MCV 91.7 93.1 92.5  PLT 209 264 284    LFT No results for input(s): "AST", "ALT", "ALKPHOS", "BILITOT", "PROT", "ALBUMIN" in the last 168 hours.   Antibiotics: Anti-infectives (From admission, onward)    Start     Dose/Rate Route Frequency Ordered Stop   07/26/23 0000  cefTRIAXone (ROCEPHIN) 2 g in sodium chloride 0.9 % 100 mL IVPB        2 g 200 mL/hr over 30 Minutes Intravenous Every 24 hours 07/25/23 2323 07/30/23 2359   07/25/23 2330  azithromycin (ZITHROMAX) 500 mg in sodium chloride 0.9 % 250 mL IVPB        500  mg 250 mL/hr over 60 Minutes Intravenous Daily at bedtime 07/25/23 2323 07/30/23 2159   07/25/23 2315  cefTRIAXone (ROCEPHIN) 1 g in sodium chloride 0.9 % 100 mL IVPB  Status:  Discontinued        1 g 200 mL/hr over 30 Minutes Intravenous  Once 07/25/23 2305 07/25/23 2323   07/25/23 2315  azithromycin (ZITHROMAX) 500 mg in sodium chloride 0.9 % 250 mL IVPB  Status:  Discontinued        500 mg 250 mL/hr over 60 Minutes Intravenous  Once 07/25/23 2305 07/25/23 2323        DVT prophylaxis: Lovenox  Code Status: DNR  Family Communication:    CONSULTS    Subjective   Denies any complaints.  Objective    Physical Examination:   Appears in no acute distress S1-S2, regular Lungs clear to auscultation bilaterally   Status is: Inpatient:      Pressure Injury 07/25/23 Coccyx Right;Left;Distal;Anterior;Mid Stage 2 -  Partial thickness  loss of dermis presenting as a shallow open injury with a red, pink wound bed without slough. Redness, non-blanchable (Active)  07/25/23 1600  Location: Coccyx  Location Orientation: Right;Left;Distal;Anterior;Mid  Staging: Stage 2 -  Partial thickness loss of dermis presenting as a shallow open injury with a red, pink wound bed without slough.  Wound Description (Comments): Redness, non-blanchable  Present on Admission: Yes        Hunter Boone   [facility] If 7PM-7AM, please contact night-coverage at [email or web address], Office  [phone]   07/27/2023, 4:24 PM  LOS: 2 days

## 2023-07-27 NOTE — Plan of Care (Signed)

## 2023-07-27 NOTE — Plan of Care (Signed)
Problem: Education: Goal: Knowledge of General Education information will improve Description Including pain rating scale, medication(s)/side effects and non-pharmacologic comfort measures Outcome: Progressing   Problem: Health Behavior/Discharge Planning: Goal: Ability to manage health-related needs will improve Outcome: Progressing

## 2023-07-27 NOTE — TOC Initial Note (Signed)
Transition of Care [facility]) - Initial/Assessment Note    Patient Details  Name: Hunter Boone MRN: [id number] Date of Birth: 1946/03/07  Transition of Care [facility]) CM/SW Contact:    Howell Rucks, RN Phone Number: 07/27/2023, 1:04 PM  Clinical Narrative:    Patient with noted hx of Alzheimer's Dementia. NCM call to pt's spouse, Hunter Boone, to introduce role of TOC/NCM and review for dc planning, spouse confirmed pt is a resident at [facility] with plan for return at dc. TOC will continue to follow.                Expected Discharge Plan: Memory Care ([facility]) Barriers to Discharge: Continued Medical Work up   Patient Goals and CMS Choice Patient states their goals for this hospitalization and ongoing recovery are:: return to [facility] CMS Medicare.gov Compare Post Acute Care list provided to:: Patient Represenative (must comment) Jaylenn, Altier (Spouse)  [phone] (Home Phone))        Expected Discharge Plan and Services       Living arrangements for the past 2 months: Skilled Nursing Facility ([facility])                                      Prior Living Arrangements/Services Living arrangements for the past 2 months: Skilled Nursing Facility ([facility])   Patient language and need for interpreter reviewed:: Yes Do you feel safe going back to the place where you live?: Yes      Need for Family Participation in Patient Care: Yes (Comment) Care giver support system in place?: Yes (comment)   Criminal Activity/Legal Involvement Pertinent to Current Situation/Hospitalization: No - Comment as needed  Activities of Daily Living   ADL Screening (condition at time of admission) Independently performs ADLs?: Yes (appropriate for developmental age) Is the patient deaf or have difficulty hearing?: No Does the patient have  difficulty seeing, even when wearing glasses/contacts?: No Does the patient have difficulty concentrating, remembering, or making decisions?: Yes  Permission Sought/Granted                  Emotional Assessment         Alcohol / Substance Use: Not Applicable Psych Involvement: No (comment)  Admission diagnosis:  Hypoxia [R09.02] Acute respiratory failure with hypoxia (HCC) [J96.01] Pneumonia of left lower lobe due to infectious organism [J18.9] Patient Active Problem List   Diagnosis Date Noted   Acute respiratory failure with hypoxia (HCC) 07/25/2023   CAP (community acquired pneumonia) 07/25/2023   HLD (hyperlipidemia) 07/25/2023   Wheezing 06/27/2023   Bilateral femoral hernias s/p lap repair w mesh 03/12/2020 03/12/2020   Left inguinal hernia s/p lap repair w mesh 03/12/2020 12/25/2019   Aortic atherosclerosis (HCC) 08/26/2019   Dementia (HCC) 03/20/2019   Bilateral leg edema 07/29/2017   Prediabetes 02/01/2017   COPD (chronic obstructive pulmonary disease) with emphysema (HCC) 06/13/2016   Lung nodule 05/25/2016   Pityriasis rosea 08/22/2015   Nonspecific abnormal electrocardiogram (ECG) (EKG) 10/25/2012   BPH with urinary obstruction 07/11/2009   Diverticulosis of colon 04/10/2009   Tobacco dependence due to cigarettes 01/26/2008   Essential hypertension 01/26/2008   PCP:  Pincus Sanes, MD Pharmacy:   [facility]) - [place], [place] - [address]  [facility] [address] [place] [address] Phone: [phone] Fax: [phone]  [id number] [place], [place] - [address] AT [facility] [address] [place] [address] Phone: [phone] Fax: [phone]  [facility] - [place], [place] - [address] [place] [address] Phone: [phone] Fax: [phone]  [facility] [address] [place] [address] Phone: [phone] Fax: [phone]  [facility] [place], [place] - [place] [address] [place] [address] Phone: [phone] Fax: [phone]     Social Drivers of Health (SDOH) Social History: SDOH Screenings   Food Insecurity: No Food Insecurity (07/26/2023)  Housing: Unknown (07/26/2023)  Transportation Needs: No Transportation Needs (07/26/2023)  Utilities: Not At Risk (07/26/2023)  Alcohol Screen: Low Risk  (09/07/2022)  Depression (PHQ2-9): Low Risk  (06/27/2023)  Financial Resource Strain: Low Risk  (09/07/2022)  Physical Activity: Inactive (09/07/2022)  Social Connections: Unknown (07/26/2023)  Stress: No Stress Concern Present (09/07/2022)  Tobacco Use: Medium Risk (07/25/2023)   SDOH Interventions:     Readmission Risk Interventions    07/27/2023    1:00 PM  Readmission Risk Prevention Plan  Post Dischage Appt Complete  Medication Screening Complete  Transportation Screening Complete

## 2023-07-27 NOTE — Progress Notes (Signed)
Mobility Specialist - Progress Note   07/27/23 1416  Mobility  Activity Ambulated independently in hallway  Level of Assistance Independent  Assistive Device None  Distance Ambulated (ft) 350 ft  Activity Response Tolerated well  Mobility Referral Yes  Mobility visit 1 Mobility  Mobility Specialist Start Time (ACUTE ONLY) 1404  Mobility Specialist Stop Time (ACUTE ONLY) 1415  Mobility Specialist Time Calculation (min) (ACUTE ONLY) 11 min   Pt received on bench and agreeable to mobility. No complaints during session. Pt to EOB after session with all needs met.    [facility]

## 2023-07-27 NOTE — Progress Notes (Addendum)
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

## 2023-07-28 DIAGNOSIS — F03911 Unspecified dementia, unspecified severity, with agitation: Secondary | ICD-10-CM | POA: Diagnosis not present

## 2023-07-28 DIAGNOSIS — J439 Emphysema, unspecified: Secondary | ICD-10-CM | POA: Diagnosis not present

## 2023-07-28 DIAGNOSIS — J189 Pneumonia, unspecified organism: Secondary | ICD-10-CM | POA: Diagnosis not present

## 2023-07-28 DIAGNOSIS — J9601 Acute respiratory failure with hypoxia: Secondary | ICD-10-CM | POA: Diagnosis not present

## 2023-07-28 LAB — BASIC METABOLIC PANEL
Anion gap: 8 (ref 5–15)
BUN: 22 mg/dL (ref 8–23)
CO2: 25 mmol/L (ref 22–32)
Calcium: 8.9 mg/dL (ref 8.9–10.3)
Chloride: 111 mmol/L (ref 98–111)
Creatinine, Ser: 0.96 mg/dL (ref 0.61–1.24)
GFR, Estimated: >60 mL/min (ref >60)
Glucose, Bld: 110 mg/dL — ABNORMAL HIGH (ref 70–99)
Potassium: 3.5 mmol/L (ref 3.5–5.1)
Sodium: 144 mmol/L (ref 135–145)

## 2023-07-28 MED ORDER — AMOXICILLIN-POT CLAVULANATE 875-125 MG PO TABS: 1.0000 | ORAL_TABLET | Freq: Two times a day (BID) | ORAL | Status: DC

## 2023-07-28 MED ORDER — AMOXICILLIN-POT CLAVULANATE 875-125 MG PO TABS: 1.0000 | ORAL_TABLET | Freq: Two times a day (BID) | ORAL | 0 refills | Status: AC

## 2023-07-28 MED ORDER — SENNOSIDES-DOCUSATE SODIUM 8.6-50 MG PO TABS: 1.0000 | ORAL_TABLET | Freq: Every evening | ORAL | 0 refills | Status: DC | PRN

## 2023-07-28 MED ORDER — SENNOSIDES-DOCUSATE SODIUM 8.6-50 MG PO TABS: 1.0000 | ORAL_TABLET | Freq: Every evening | ORAL | Status: DC | PRN

## 2023-07-28 NOTE — Plan of Care (Signed)

## 2023-07-28 NOTE — Progress Notes (Signed)
Patient discharged via wheelchair on RA. VSS. Patient had all belongings which included clothes and shoes. Patient not in distress when discharged.

## 2023-07-28 NOTE — Discharge Summary (Addendum)
 Physician Discharge Summary   Patient: Hunter Boone MRN: [id number] DOB: Nov 05, 1945  Admit date:     07/25/2023  Discharge date: 07/28/23  Discharge Physician: Meredeth Ide   PCP: Pincus Sanes, MD   Recommendations at discharge:   Follow-up with urology as outpatient Follow-up PCP in 2 weeks  Discharge Diagnoses: Principal Problem:   Acute respiratory failure with hypoxia (HCC) Active Problems:   Tobacco dependence due to cigarettes   Essential hypertension   BPH with urinary obstruction   COPD (chronic obstructive pulmonary disease) with emphysema (HCC)   Dementia (HCC)   CAP (community acquired pneumonia)   HLD (hyperlipidemia)  Resolved Problems:   * No resolved hospital problems. *  Hospital Course:  78 yr old man who was sent to the [facility] ED by the memory care institution where he resides after he was found to be hypoxic with an SaO2 of 70%. He was complaining of being short of breath. The patient recently received treatment for pneumonia.    In the ED the patient was found to have a potassium of 3.2, BNP of 126, and a WBC of 11.7. CXR demonstrated interval diffuse bronchitic changes and a mild patchy density in the at the posterior lower lung zones possibly representing pneumonia. There were also stable changes consistent with COPD. Cepheid panel was negative.   The patient's spouse confirms that the patient is a DNR/DNI.  Assessment and Plan:  Acute respiratory failure with hypoxia (HCC) Community-acquired pneumonia with bronchitis -Improving -Oxygen has been weaned off to room air -Will discharge on Augmentin for 2 more days to complete 5 days of treatment    Acute COPD exacerbation, mild -Continue as needed nebulizer    BPH with urinary obstruction -Flomax resumed   Moderate dementia (HCC) -Exelon patch   Hyperlipidemia -Crestor   Essential hypertension -Cozaar   Anxiety depression -Lexapro, Seroquel -Zyprexa and a sitter for agitation.    Hypernatremia -Mild elevation of sodium -Likely from poor p.o. intake Improved   Hematuria -UA showed 25-50 RBCs per high-power field -CT scan from 2023 showed prostate enlargement with thickened bladder wall -Will need outpatient urology follow-up      Consultants:  Procedures performed:  Disposition: Skilled nursing facility Diet recommendation:  Discharge Diet Orders (From admission, onward)     Start     Ordered   07/28/23 0000  Diet - low sodium heart healthy        07/28/23 1306           Regular diet DISCHARGE MEDICATION: Allergies as of 07/28/2023       Reactions   Namenda [memantine] Other (See Comments)   Increased confusion        Medication List     TAKE these medications    acetaminophen 325 MG tablet Commonly known as: Tylenol Take 2 tablets (650 mg total) by mouth every 6 (six) hours as needed.   amoxicillin-clavulanate 875-125 MG tablet Commonly known as: AUGMENTIN Take 1 tablet by mouth 2 (two) times daily for 2 days. Start taking on: July 29, 2023   ascorbic acid 500 MG tablet Commonly known as: VITAMIN C Take 500 mg by mouth daily.   Baza Protect Moisture Barrier 12 % Crea Generic drug: Zinc Oxide Apply 1 Application topically 2 (two) times daily.   CENTRUM PO Take 1 tablet by mouth daily.   PRESERVISION AREDS 2 PO Take 1 capsule by mouth every morning.   escitalopram 10 MG tablet Commonly known as: Lexapro Take 1  tablet (10 mg total) by mouth daily.   furosemide 20 MG tablet Commonly known as: LASIX Take 1 tablet (20 mg total) by mouth daily.   guaiFENesin 600 MG 12 hr tablet Commonly known as: Mucinex Take 1 tablet (600 mg total) by mouth 2 (two) times daily as needed.   ipratropium 0.03 % nasal spray Commonly known as: ATROVENT Place 2 sprays into both nostrils every 12 (twelve) hours.   losartan 100 MG tablet Commonly known as: COZAAR TAKE 1 TABLET BY MOUTH  DAILY   QUEtiapine 100 MG tablet Commonly  known as: SEROquel Take 1 tablet (100 mg total) by mouth at bedtime.   QUEtiapine 25 MG tablet Commonly known as: SEROquel Take one tab daily at 4 pm   rivastigmine 1.5 MG capsule Commonly known as: EXELON Take 1 capsule at night for  1 week and then one capsule 2 times a day   rosuvastatin 5 MG tablet Commonly known as: CRESTOR TAKE 1 TABLET BY MOUTH  DAILY   senna-docusate 8.6-50 MG tablet Commonly known as: Senokot-S Take 1 tablet by mouth at bedtime as needed for mild constipation.   tamsulosin 0.4 MG Caps capsule Commonly known as: FLOMAX TAKE 1 CAPSULE BY MOUTH DAILY        Follow-up Information     Pincus Sanes, MD Follow up in 2 week(s).   Specialty: Internal Medicine Contact information: [address] [place] [address] [phone]         [facility] Follow up.   Contact information: [address] [place] [address] [place] [address] [phone]               Discharge Exam: There were no vitals filed for this visit. General-appears in no acute distress Heart-S1-S2, regular, no murmur auscultated Lungs-clear to auscultation bilaterally, no wheezing or crackles auscultated Abdomen-soft, nontender, no organomegaly Extremities-no edema in the lower extremities Neuro-alert, oriented x3, no focal deficit noted  Condition at discharge: good  The results of significant diagnostics from this hospitalization (including imaging, microbiology, ancillary and laboratory) are listed below for reference.   Imaging Studies: DG Chest 2 View Result Date: 07/25/2023 CLINICAL DATA:  Shortness of breath.  Dyspnea. EXAM: CHEST - 2 VIEW COMPARISON:  06/27/2023 FINDINGS: Normal sized heart. Normal pulmonary vascularity. The lungs remain hyperexpanded with interval mild diffuse peribronchial thickening. Mild patchy density at the posterior lower lung zones on the lateral view. No pleural fluid seen. Diffuse osteopenia. IMPRESSION: 1.  Interval mild diffuse bronchitic changes. 2. Mild patchy density at the posterior lower lung zones on the lateral view, which could represent atelectasis or pneumonia. 3. Stable changes of COPD. Electronically Signed   By: Beckie Salts M.D.   On: 07/25/2023 17:50    Microbiology: Results for orders placed or performed during the hospital encounter of 07/25/23  Resp panel by RT-PCR (RSV, Flu A&B, Covid) Anterior Nasal Swab     Status: None   Collection Time: 07/25/23  4:14 PM   Specimen: Anterior Nasal Swab  Result Value Ref Range Status   SARS Coronavirus 2 by RT PCR NEGATIVE NEGATIVE Final    Comment: (NOTE) SARS-CoV-2 target nucleic acids are NOT DETECTED.  The SARS-CoV-2 RNA is generally detectable in upper respiratory specimens during the acute phase of infection. The lowest concentration of SARS-CoV-2 viral copies this assay can detect is 138 copies/mL. A negative result does not preclude SARS-Cov-2 infection and should not be used as the sole basis for treatment or other  patient management decisions. A negative result may occur with  improper specimen collection/handling, submission of specimen other than nasopharyngeal swab, presence of viral mutation(s) within the areas targeted by this assay, and inadequate number of viral copies(<138 copies/mL). A negative result must be combined with clinical observations, patient history, and epidemiological information. The expected result is Negative.  Fact Sheet for Patients:  BloggerCourse.com  Fact Sheet for Healthcare Providers:  SeriousBroker.it  This test is no t yet approved or cleared by the [place] FDA and  has been authorized for detection and/or diagnosis of SARS-CoV-2 by FDA under an Emergency Use Authorization (EUA). This EUA will remain  in effect (meaning this test can be used) for the duration of the COVID-19 declaration under Section 564(b)(1) of the Act,  21 U.S.C.section 360bbb-3(b)(1), unless the authorization is terminated  or revoked sooner.       Influenza A by PCR NEGATIVE NEGATIVE Final   Influenza B by PCR NEGATIVE NEGATIVE Final    Comment: (NOTE) The Xpert Xpress SARS-CoV-2/FLU/RSV plus assay is intended as an aid in the diagnosis of influenza from Nasopharyngeal swab specimens and should not be used as a sole basis for treatment. Nasal washings and aspirates are unacceptable for Xpert Xpress SARS-CoV-2/FLU/RSV testing.  Fact Sheet for Patients: BloggerCourse.com  Fact Sheet for Healthcare Providers: SeriousBroker.it  This test is not yet approved or cleared by the [place] FDA and has been authorized for detection and/or diagnosis of SARS-CoV-2 by FDA under an Emergency Use Authorization (EUA). This EUA will remain in effect (meaning this test can be used) for the duration of the COVID-19 declaration under Section 564(b)(1) of the Act, 21 U.S.C. section 360bbb-3(b)(1), unless the authorization is terminated or revoked.     Resp Syncytial Virus by PCR NEGATIVE NEGATIVE Final    Comment: (NOTE) Fact Sheet for Patients: BloggerCourse.com  Fact Sheet for Healthcare Providers: SeriousBroker.it  This test is not yet approved or cleared by the [place] FDA and has been authorized for detection and/or diagnosis of SARS-CoV-2 by FDA under an Emergency Use Authorization (EUA). This EUA will remain in effect (meaning this test can be used) for the duration of the COVID-19 declaration under Section 564(b)(1) of the Act, 21 U.S.C. section 360bbb-3(b)(1), unless the authorization is terminated or revoked.  Performed at [facility], [address] [place], [place] [address]   MRSA Next Gen by PCR, Nasal     Status: None   Collection Time: 07/26/23  4:30 PM   Specimen: Nasal Mucosa; Nasal Swab   Result Value Ref Range Status   MRSA by PCR Next Gen NOT DETECTED NOT DETECTED Final    Comment: (NOTE) The GeneXpert MRSA Assay (FDA approved for NASAL specimens only), is one component of a comprehensive MRSA colonization surveillance program. It is not intended to diagnose MRSA infection nor to guide or monitor treatment for MRSA infections. Test performance is not FDA approved in patients less than 43 years old. Performed at [facility], [address] [place], [place] [address]     Labs: CBC: Recent Labs  Lab 07/25/23 1614 07/26/23 1744 07/27/23 0924  WBC 11.7* 16.6* 16.7*  NEUTROABS 8.8* 13.2* 12.3*  HGB 11.6* 12.6* 12.7*  HCT 36.3* 40.3 40.6  MCV 91.7 93.1 92.5  PLT 209 264 284   Basic Metabolic Panel: Recent Labs  Lab 07/25/23 1614 07/25/23 2021 07/26/23 0903 07/27/23 0924 07/28/23 0434  NA 140  --  147* 147* 144  K 3.2*  --  3.2* 4.2 3.5  CL 104  --  108 112* 111  CO2 26  --  24 25 25   GLUCOSE 136*  --  127* 98 110*  BUN 20  --  24* 29* 22  CREATININE 1.04  --  0.99 0.93 0.96  CALCIUM 8.7*  --  9.3 9.2 8.9  MG  --  2.0  --   --   --    Liver Function Tests: No results for input(s): "AST", "ALT", "ALKPHOS", "BILITOT", "PROT", "ALBUMIN" in the last 168 hours. CBG: No results for input(s): "GLUCAP" in the last 168 hours.  Discharge time spent: greater than 30 minutes.  Signed: Meredeth Ide, MD [facility] 07/28/2023

## 2023-08-03 ENCOUNTER — Ambulatory Visit: Payer: Medicare Other | Admitting: Internal Medicine

## 2023-08-09 ENCOUNTER — Ambulatory Visit: Payer: Medicare Other

## 2023-08-11 ENCOUNTER — Encounter: Payer: Self-pay | Admitting: Internal Medicine

## 2023-08-11 NOTE — Progress Notes (Unsigned)
 Subjective:    Patient ID: Hunter Boone, male    DOB: 1946/06/09, 78 y.o.   MRN: [id number]     HPI Canuto is here for follow up from the hospital.  Admitted 2/11-2/13 for acute respiratory failure with hypoxia  Brought to the emergency room after he was found to be hypoxic with oxygenation level of 70%.  He was complaining of being short of breath.  In the ED potassium was 3.2, BNP 126, WBC 11.7.  Chest x-ray showed diffuse bronchitic changes with mild patchy density in the posterior lower lung zones possibly representing pneumonia.  COPD changes.  Acute respiratory failure with hypoxia: Secondary to community-acquired pneumonia with bronchitis Improved Initially on oxygen, but was weaned off to room air Started on antibiotics-discharged on Augmentin for 2 more days to complete 5 days of treatment  Acute COPD exacerbation, mild: Nebulizer treatments  BPH with urinary obstruction, hematuria: Flomax resumed UA showed 25-50 RBCs CT scan showed prostate enlargement with thickened bladder wall Urology follow-up outpatient  Moderate dementia, with depression and anxiety: Exelon patch, Lexapro, Seroquel Received Zyprexa and a sitter for agitation  Hyperlipidemia: Crestor  Hypertension: Cozaar  Hyponatremia: Mild Likely related to poor oral intake Improved    ? Recheck cbc   Medications and allergies reviewed with patient and updated if appropriate.  Current Outpatient Medications on File Prior to Visit  Medication Sig Dispense Refill   acetaminophen (TYLENOL) 325 MG tablet Take 2 tablets (650 mg total) by mouth every 6 (six) hours as needed. 90 tablet 3   escitalopram (LEXAPRO) 10 MG tablet Take 1 tablet (10 mg total) by mouth daily. 30 tablet 5   furosemide (LASIX) 20 MG tablet Take 1 tablet (20 mg total) by mouth daily. 30 tablet 0   guaiFENesin (MUCINEX) 600 MG 12 hr tablet Take 1 tablet (600 mg total) by mouth 2 (two) times daily as needed. 30 tablet 2    ipratropium (ATROVENT) 0.03 % nasal spray Place 2 sprays into both nostrils every 12 (twelve) hours. 30 mL 12   losartan (COZAAR) 100 MG tablet TAKE 1 TABLET BY MOUTH  DAILY 90 tablet 3   Multiple Vitamins-Minerals (CENTRUM PO) Take 1 tablet by mouth daily.     Multiple Vitamins-Minerals (PRESERVISION AREDS 2 PO) Take 1 capsule by mouth every morning.      QUEtiapine (SEROQUEL) 100 MG tablet Take 1 tablet (100 mg total) by mouth at bedtime. 90 tablet 1   QUEtiapine (SEROQUEL) 25 MG tablet Take one tab daily at 4 pm 30 tablet 5   rivastigmine (EXELON) 1.5 MG capsule Take 1 capsule at night for  1 week and then one capsule 2 times a day 60 capsule 11   rosuvastatin (CRESTOR) 5 MG tablet TAKE 1 TABLET BY MOUTH  DAILY 90 tablet 3   senna-docusate (SENOKOT-S) 8.6-50 MG tablet Take 1 tablet by mouth at bedtime as needed for mild constipation. 30 tablet 0   tamsulosin (FLOMAX) 0.4 MG CAPS capsule TAKE 1 CAPSULE BY MOUTH DAILY 100 capsule 2   vitamin C (ASCORBIC ACID) 500 MG tablet Take 500 mg by mouth daily.     Zinc Oxide (BAZA PROTECT MOISTURE BARRIER) 12 % CREA Apply 1 Application topically 2 (two) times daily.     No current facility-administered medications on file prior to visit.     Review of Systems     Objective:  There were no vitals filed for this visit. BP Readings from Last 3 Encounters:  07/28/23 133/70  06/27/23 120/70  01/21/23 128/74   Wt Readings from Last 3 Encounters:  06/27/23 142 lb (64.4 kg)  01/21/23 142 lb 6.4 oz (64.6 kg)  10/05/22 121 lb (54.9 kg)   There is no height or weight on file to calculate BMI.    Physical Exam     Lab Results  Component Value Date   WBC 16.7 (H) 07/27/2023   HGB 12.7 (L) 07/27/2023   HCT 40.6 07/27/2023   PLT 284 07/27/2023   GLUCOSE 110 (H) 07/28/2023   CHOL 95 01/21/2023   TRIG 121.0 01/21/2023   HDL 35.60 (L) 01/21/2023   LDLCALC 35 01/21/2023   ALT 52 01/21/2023   AST 41 (H) 01/21/2023   NA 144 07/28/2023   K  3.5 07/28/2023   CL 111 07/28/2023   CREATININE 0.96 07/28/2023   BUN 22 07/28/2023   CO2 25 07/28/2023   TSH 1.60 01/21/2023   PSA 9.52 (H) 08/16/2017   INR 1.01 05/24/2016   HGBA1C 6.4 01/21/2023   DG Chest 2 View CLINICAL DATA:  Shortness of breath.  Dyspnea.  EXAM: CHEST - 2 VIEW  COMPARISON:  06/27/2023  FINDINGS: Normal sized heart. Normal pulmonary vascularity. The lungs remain hyperexpanded with interval mild diffuse peribronchial thickening. Mild patchy density at the posterior lower lung zones on the lateral view. No pleural fluid seen. Diffuse osteopenia.  IMPRESSION: 1. Interval mild diffuse bronchitic changes. 2. Mild patchy density at the posterior lower lung zones on the lateral view, which could represent atelectasis or pneumonia. 3. Stable changes of COPD.  Electronically Signed   By: Beckie Salts M.D.   On: 07/25/2023 17:50    Assessment & Plan:    See Problem List for Assessment and Plan of chronic medical problems.

## 2023-08-11 NOTE — Patient Instructions (Addendum)
         Medications changes include :   stop preservision and vitamin C supplements       Return for follow up as scheduled.

## 2023-08-12 ENCOUNTER — Ambulatory Visit (INDEPENDENT_AMBULATORY_CARE_PROVIDER_SITE_OTHER): Payer: Medicare Other | Admitting: Internal Medicine

## 2023-08-12 ENCOUNTER — Encounter: Payer: Self-pay | Admitting: Internal Medicine

## 2023-08-12 VITALS — BP 128/76 | HR 78 | Temp 98.0°F | Ht 72.0 in | Wt 136.4 lb

## 2023-08-12 DIAGNOSIS — I10 Essential (primary) hypertension: Secondary | ICD-10-CM

## 2023-08-12 DIAGNOSIS — I700 Atherosclerosis of aorta: Secondary | ICD-10-CM

## 2023-08-12 DIAGNOSIS — J439 Emphysema, unspecified: Secondary | ICD-10-CM

## 2023-08-12 DIAGNOSIS — R600 Localized edema: Secondary | ICD-10-CM

## 2023-08-12 DIAGNOSIS — N401 Enlarged prostate with lower urinary tract symptoms: Secondary | ICD-10-CM

## 2023-08-12 DIAGNOSIS — E785 Hyperlipidemia, unspecified: Secondary | ICD-10-CM | POA: Diagnosis not present

## 2023-08-12 DIAGNOSIS — N138 Other obstructive and reflux uropathy: Secondary | ICD-10-CM

## 2023-08-12 DIAGNOSIS — J189 Pneumonia, unspecified organism: Secondary | ICD-10-CM

## 2023-08-12 DIAGNOSIS — J9601 Acute respiratory failure with hypoxia: Secondary | ICD-10-CM

## 2023-08-12 DIAGNOSIS — F03911 Unspecified dementia, unspecified severity, with agitation: Secondary | ICD-10-CM

## 2023-08-12 NOTE — Assessment & Plan Note (Signed)
 Resolved Completed course of antibiotics Has a residual cough that is mild Denies shortness of breath, but I do think he probably has some chronic shortness of breath No wheezing No fevers

## 2023-08-12 NOTE — Assessment & Plan Note (Signed)
Chronic Blood pressure well controlled Continue losartan 100 mg daily

## 2023-08-12 NOTE — Assessment & Plan Note (Signed)
 Chronic Following with neurology Currently on Exelon 1.5 mg twice daily Continue Seroquel 25 mg at 4 PM and Seroquel 100 mg at bedtime which seems to be working well Continue Lexapro 10 mg daily

## 2023-08-12 NOTE — Assessment & Plan Note (Signed)
 Chronic Continue rosuvastatin 5 mg daily LDL at goal

## 2023-08-12 NOTE — Assessment & Plan Note (Signed)
 Chronic Continue Crestor 5 mg daily LDL at goal

## 2023-08-12 NOTE — Assessment & Plan Note (Addendum)
 Chronic Saw urology a few years ago Microscopic blood in urine from BPH Elevated psa, bx negative Restarted on flomax - needs to stay on this long-term He feels he is urinating without difficulty Follow up with urology

## 2023-08-12 NOTE — Assessment & Plan Note (Signed)
 Chronic Controlled He is sleeping in the bed which has helped significantly Wearing compression socks Continue Lasix 20 mg daily

## 2023-08-12 NOTE — Assessment & Plan Note (Signed)
 Resolved Oxygen saturation here on room air 98% He required pneumonia was the cause and that has been successfully treated No hypoxia with his COPD

## 2023-08-12 NOTE — Assessment & Plan Note (Signed)
 Chronic No longer smoking Does have some chronic shortness of breath, which is mild Not currently on any inhalers-I do not think he would be able to use inhalers and does not seem to be limited too much by the shortness of breath so we will hold off on additional treatment

## 2023-09-12 ENCOUNTER — Ambulatory Visit (INDEPENDENT_AMBULATORY_CARE_PROVIDER_SITE_OTHER)

## 2023-09-12 VITALS — Ht 72.0 in | Wt 142.0 lb

## 2023-09-12 DIAGNOSIS — Z0000 Encounter for general adult medical examination without abnormal findings: Secondary | ICD-10-CM | POA: Diagnosis not present

## 2023-09-12 NOTE — Patient Instructions (Addendum)
 Mr. Hunter Boone , Thank you for taking time to come for your Medicare Wellness Visit. I appreciate your ongoing commitment to your health goals. Please review the following plan we discussed and let me know if I can assist you in the future.   Referrals/Orders/Follow-Ups/Clinician Recommendations: Aim for 30 minutes of exercise or brisk walking, 6-8 glasses of water, and 5 servings of fruits and vegetables each day.   This is a list of the screening recommended for you and due dates:  Health Maintenance  Topic Date Due   Screening for Lung Cancer  04/09/2022   COVID-19 Vaccine (7 - 2024-25 season) 10/26/2023   Medicare Annual Wellness Visit  09/11/2024   DTaP/Tdap/Td vaccine (4 - Td or Tdap) 04/01/2032   Pneumonia Vaccine  Completed   Flu Shot  Completed   Hepatitis C Screening  Completed   Zoster (Shingles) Vaccine  Completed   HPV Vaccine  Aged Out   Colon Cancer Screening  Discontinued    Advanced directives: (Copy Requested) Please bring a copy of your health care power of attorney and living will to the office to be added to your chart at your convenience. You can mail to [facility] [address] [place], [place] [address] or email to [email or web address]  Next Medicare Annual Wellness Visit scheduled for next year: Yes - scheduled with Spouse

## 2023-09-12 NOTE — Progress Notes (Signed)
 Subjective:   Hunter Boone is a 78 y.o. who presents for a Medicare Wellness preventive visit.  Visit Complete: Virtual I connected with  Hunter Boone on 09/12/23 by a audio enabled telemedicine application and verified that I am speaking with the correct person using two identifiers.  Patient Location: Skilled Nursing Facility - [facility] (on [address] in [place], [place])  Provider Location: Office/Clinic  I discussed the limitations of evaluation and management by telemedicine. The patient expressed understanding and agreed to proceed.  Vital Signs: Because this visit was a virtual/telehealth visit, some criteria may be missing or patient reported. Any vitals not documented were not able to be obtained and vitals that have been documented are patient reported.  VideoDeclined- This patient declined Librarian, academic. Therefore the visit was completed with audio only.  Persons Participating in Visit: Patient assisted by Spouse, [name].  AWV Questionnaire: No: Patient Medicare AWV questionnaire was not completed prior to this visit.  Cardiac Risk Factors include: advanced age (>29men, >76 women);hypertension;dyslipidemia;male gender     Objective:    Today's Vitals   09/12/23 1211  Weight: 142 lb (64.4 kg)  Height: 6' (1.829 m)   Body mass index is 19.26 kg/m.     09/12/2023   12:05 PM 07/26/2023    3:44 PM 04/06/2023   12:58 PM 10/05/2022    1:00 PM 09/07/2022    1:07 PM 12/31/2021    9:34 AM 11/27/2021   11:22 AM  Advanced Directives  Does Patient Have a Medical Advance Directive? Yes Yes No Yes Yes No No  Type of Estate agent of [place];Living will Healthcare Power of [employer] Power of [employer] Power of [place];Living will    Does patient want to make changes to medical advance directive?  No - Patient declined  No - Guardian declined     Copy of Healthcare Power of Attorney  in Chart? No - copy requested No - copy requested  No - copy requested No - copy requested    Would patient like information on creating a medical advance directive?   No - Patient declined        Current Medications (verified) Outpatient Encounter Medications as of 09/12/2023  Medication Sig   acetaminophen (TYLENOL) 325 MG tablet Take 2 tablets (650 mg total) by mouth every 6 (six) hours as needed.   escitalopram (LEXAPRO) 10 MG tablet Take 1 tablet (10 mg total) by mouth daily.   furosemide (LASIX) 20 MG tablet Take 1 tablet (20 mg total) by mouth daily.   guaiFENesin (MUCINEX) 600 MG 12 hr tablet Take 1 tablet (600 mg total) by mouth 2 (two) times daily as needed.   ipratropium (ATROVENT) 0.03 % nasal spray Place 2 sprays into both nostrils every 12 (twelve) hours.   losartan (COZAAR) 100 MG tablet TAKE 1 TABLET BY MOUTH  DAILY   QUEtiapine (SEROQUEL) 100 MG tablet Take 1 tablet (100 mg total) by mouth at bedtime.   QUEtiapine (SEROQUEL) 25 MG tablet Take one tab daily at 4 pm   rosuvastatin (CRESTOR) 5 MG tablet TAKE 1 TABLET BY MOUTH  DAILY   senna-docusate (SENOKOT-S) 8.6-50 MG tablet Take 1 tablet by mouth at bedtime as needed for mild constipation.   tamsulosin (FLOMAX) 0.4 MG CAPS capsule TAKE 1 CAPSULE BY MOUTH DAILY   Zinc Oxide (BAZA PROTECT MOISTURE BARRIER) 12 % CREA Apply 1 Application topically 2 (two) times daily.   [DISCONTINUED] Multiple  Vitamins-Minerals (CENTRUM PO) Take 1 tablet by mouth daily.   [DISCONTINUED] rivastigmine (EXELON) 1.5 MG capsule Take 1 capsule at night for  1 week and then one capsule 2 times a day   No facility-administered encounter medications on file as of 09/12/2023.    Allergies (verified) Namenda [memantine]   History: Past Medical History:  Diagnosis Date   Aortic atherosclerosis (HCC)    chronic--- followed by pcp   BPH without obstruction/lower urinary tract symptoms    urologist--- dr bell   COPD with emphysema (HCC)    followed  by pcp---  never used oxygen,  still smokes 1 ppd   Diverticulosis of colon    Full dentures    Heart murmur    per pt's pcp note 02-27-2020 in epic , she heard murmur   Hemorrhoids    History of adenomatous polyp of colon    History of concussion    per pt in [place] ,  no resiudal   History of elevated PSA    negative bx 2019   History of gastroesophageal reflux (GERD)    03-06-2020 per pt no issues for several yrs   History of pneumothorax 2017   traumatic due to fall   Hypertension    followed by pcp   Left inguinal hernia    Macular degeneration, left eye    Mild neurocognitive disorder    neruologist--- dr Karel Jarvis   Nodule of right lung    lower lobe--- followed by pcp--- last chest CT 05-07-2019 stable   Pre-diabetes    Wears glasses    Past Surgical History:  Procedure Laterality Date   COLONOSCOPY  last one 04-20-2016   EVALUATION UNDER ANESTHESIA WITH HEMORRHOIDECTOMY N/A 07/01/2017   Procedure: ANORECTAL EXAMINATION UNDER ANESTHESIA, HEMORRHOIDECTOMY, HEMORRHOIDAL LIGATION/PEXY;  Surgeon: Karie Soda, MD;  Location: [facility];  Service: General;  Laterality: N/A;  GENERAL AND LOCAL   INGUINAL HERNIA REPAIR N/A 03/12/2020   Procedure: LAPARSCOPIC LEFT INGUINAL HERNIA REPAIR; BILATERAL FEMORAL HERNIA REPAIR; BILATERAL TAP BLOCK;  Surgeon: Karie Soda, MD;  Location: [facility];  Service: General;  Laterality: N/A;   LAPAROSCOPIC ABDOMINAL EXPLORATION N/A 03/12/2020   Procedure: LAPAROSCOPIC  EXPLORATION AND REPAIRS OF HERNIAS FOUND;  Surgeon: Karie Soda, MD;  Location: [facility];  Service: General;  Laterality: N/A;   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   Procedure: TOTAL HIP ARTHROPLASTY ANTERIOR APPROACH;  Surgeon: Samson Frederic, MD;  Location: [facility];  Service: Orthopedics;  Laterality: Left;   Family History  Problem Relation Age of Onset   Heart attack Father        Early 38s   Diabetes Father    Dementia Father         Unspecified type   Diabetes Mother    Dementia Mother        Unspecified type, said to be early-stages   Liver cancer Maternal Aunt    Alzheimer's disease Sister        Symptom onset in mid to late 75s   Stroke Neg Hx    COPD Neg Hx    Colon cancer Neg Hx    Esophageal cancer Neg Hx    Rectal cancer Neg Hx    Stomach cancer Neg Hx    Asthma Neg Hx    Social History   Socioeconomic History   Marital status: Married    Spouse name: Not on file   Number of children: Not on file   Years of education: 43  Highest education level: Associate degree: academic program  Occupational History   Occupation: Minister  Tobacco Use   Smoking status: Former    Current packs/day: 1.00    Average packs/day: 1 pack/day for 56.0 years (56.0 ttl pk-yrs)    Types: Cigarettes, Pipe    Passive exposure: Past   Smokeless tobacco: Never   Tobacco comments:    03-06-2020 per pt quit pipe approx. 02/ 2021,  smoked since age 89    09/07/2022 quit smoking in February 2024  Vaping Use   Vaping status: Never Used  Substance and Sexual Activity   Alcohol use: Not Currently   Drug use: Never   Sexual activity: Not Currently  Other Topics Concern   Not on file  Social History Narrative   Currently residing at [facility] in [place], [place] (on [address])      Right handed      Highest Level of edu- some college   Social Drivers of Health   Financial Resource Strain: Low Risk  (09/12/2023)   Overall Financial Resource Strain (CARDIA)    Difficulty of Paying Living Expenses: Not hard at all  Food Insecurity: No Food Insecurity (09/12/2023)   Hunger Vital Sign    Worried About Running Out of Food in the Last Year: Never true    Ran Out of Food in the Last Year: Never true  Transportation Needs: No Transportation Needs (09/12/2023)   PRAPARE - Administrator, Civil Service (Medical): No    Lack of Transportation (Non-Medical): No  Physical Activity: Insufficiently Active  (09/12/2023)   Exercise Vital Sign    Days of Exercise per Week: 5 days    Minutes of Exercise per Session: 20 min  Stress: Stress Concern Present (09/12/2023)   [employer] of Occupational Health - Occupational Stress Questionnaire    Feeling of Stress : To some extent  Social Connections: Moderately Isolated (09/12/2023)   Social Connection and Isolation Panel [NHANES]    Frequency of Communication with Friends and Family: More than three times a week    Frequency of Social Gatherings with Friends and Family: Never    Attends Religious Services: Never    Database administrator or Organizations: No    Attends Banker Meetings: Never    Marital Status: Married    Tobacco Counseling Counseling given: No Tobacco comments: 03-06-2020 per pt quit pipe approx. 02/ 2021,  smoked since age 4 09/07/2022 quit smoking in February 2024    Clinical Intake:  Pre-visit preparation completed: Yes  Pain : No/denies pain     BMI - recorded: 19.26 Nutritional Status: BMI <19  Underweight Nutritional Risks: None Diabetes: No  Lab Results  Component Value Date   HGBA1C 6.4 01/21/2023   HGBA1C 5.7 09/30/2021   HGBA1C 5.7 03/31/2021     How often do you need to have someone help you when you read instructions, pamphlets, or other written materials from your doctor or pharmacy?: 1 - Never  Interpreter Needed?: No  Information entered by :: Hassell Halim, CMA  Activities of Daily Living     09/12/2023   12:16 PM 07/26/2023    3:00 PM  In your present state of health, do you have any difficulty performing the following activities:  Hearing? 0 0  Vision? 0 0  Difficulty concentrating or making decisions? 1 1  Comment currently residing at a Memory Unit   Walking or climbing stairs? 0   Dressing or bathing? 1  Comment yes  -Spouse and currently resideds at Memory Care > Nurses/CNAs   Doing errands, shopping? 1 0  Preparing Food and eating ? N   Using the Toilet? Y    Comment Spouse and currently resides at Memory Care > Nurses/CNAs   In the past six months, have you accidently leaked urine? Y   Comment wears a depend   Do you have problems with loss of bowel control? N   Managing your Medications? Y   Comment Spouse and currently resides at Memory Care > Nurses/CNAs   Managing your Finances? Y   Comment Spouse handles   Housekeeping or managing your Housekeeping? Y   Comment Spouse and currently resides at Memory Care > Nurses/CNAs     Patient Care Team: Pincus Sanes, MD as PCP - General (Internal Medicine) Karie Soda, MD as Consulting Physician (General Surgery) Pyrtle, Carie Caddy, MD as Consulting Physician (Gastroenterology) Van Clines, MD as Consulting Physician (Neurology)  Indicate any recent Medical Services you may have received from other than [facility] providers in the past year (date may be approximate).     Assessment:   This is a routine wellness examination for [place].  Hearing/Vision screen Hearing Screening - Comments:: Denies hearing difficulties   Vision Screening - Comments:: Wears rx glasses    Goals Addressed               This Visit's Progress     Patient Stated (pt-stated)        Plan for pt is to continue memory activities.       Depression Screen     09/12/2023   12:32 PM 06/27/2023   11:47 AM 01/21/2023    2:27 PM 09/07/2022    1:16 PM 02/03/2022    1:47 PM 12/04/2021   10:56 AM 09/03/2021    1:35 PM  PHQ 2/9 Scores  PHQ - 2 Score 1 0 2 0 0 0 1  PHQ- 9 Score 7  6  0      Fall Risk     09/12/2023   12:20 PM 06/27/2023   11:47 AM 04/06/2023   12:58 PM 01/21/2023    2:27 PM 10/05/2022    1:00 PM  Fall Risk   Falls in the past year? 0 0 0 0 0  Number falls in past yr: 0 0 0 0 0  Injury with Fall? 0 0 0 0 0  Risk for fall due to : No Fall Risks No Fall Risks  No Fall Risks   Follow up Falls prevention discussed;Falls evaluation completed Falls evaluation completed Falls evaluation completed Falls  evaluation completed Falls evaluation completed    MEDICARE RISK AT HOME: currently at [facility] Medicare Risk at Home Any stairs in or around the home?: No If so, are there any without handrails?: No Home free of loose throw rugs in walkways, pet beds, electrical cords, etc?: Yes Adequate lighting in your home to reduce risk of falls?: Yes Life alert?: Yes (residing at [facility]) Use of a cane, walker or w/c?: No Grab bars in the bathroom?: Yes Shower chair or bench in shower?: Yes Elevated toilet seat or a handicapped toilet?: Yes  TIMED UP AND GO:  Was the test performed?  No  Cognitive Function: Impaired: Cognitive status assessed by direct observation. Patient is unable to complete screening 6CIT or other cognitive screening test. Currently residing at [facility] - Memory unit.      10/01/2021   10:00 AM  01/23/2019    8:00 AM  Montreal Cognitive Assessment   Visuospatial/ Executive (0/5) 1   Naming (0/3) 2   Attention: Read list of digits (0/2) 2 2  Attention: Read list of letters (0/1) 1 1  Attention: Serial 7 subtraction starting at 100 (0/3) 1 3  Language: Repeat phrase (0/2) 2 2  Language : Fluency (0/1) 1 0  Abstraction (0/2) 1 1  Delayed Recall (0/5) 0 5  Orientation (0/6) 1 6  Total 12   Adjusted Score (based on education) 12       Immunizations Immunization History  Administered Date(s) Administered   Fluad Quad(high Dose 65+) 03/15/2019, 02/27/2020, 03/31/2021, 04/14/2022   Influenza Whole 03/14/2012   Influenza, High Dose Seasonal PF 04/17/2013, 03/12/2014, 04/15/2015, 03/09/2016, 03/28/2018, 04/05/2019, 03/16/2023   Moderna Covid-19 Fall Seasonal Vaccine 52yrs & older 04/28/2023   PFIZER(Purple Top)SARS-COV-2 Vaccination 08/04/2019, 08/28/2019, 04/12/2020   Pfizer Covid-19 Vaccine Bivalent Booster 97yrs & up 04/08/2021, 04/14/2022   Pfizer(Comirnaty)Fall Seasonal Vaccine 12 years and older 04/14/2022   Pneumococcal  Conjugate-13 06/11/2016   Pneumococcal Polysaccharide-23 08/19/2010   Td 07/27/2004   Tdap 06/05/2012, 04/01/2022   Zoster Recombinant(Shingrix) 07/13/2018, 02/09/2019   Zoster, Live 01/23/2013, 01/24/2013    Screening Tests Health Maintenance  Topic Date Due   Lung Cancer Screening  04/09/2022   COVID-19 Vaccine (7 - 2024-25 season) 10/26/2023   Medicare Annual Wellness (AWV)  09/11/2024   DTaP/Tdap/Td (4 - Td or Tdap) 04/01/2032   Pneumonia Vaccine 97+ Years old  Completed   INFLUENZA VACCINE  Completed   Hepatitis C Screening  Completed   Zoster Vaccines- Shingrix  Completed   HPV VACCINES  Aged Out   Colonoscopy  Discontinued    Health Maintenance  Health Maintenance Due  Topic Date Due   Lung Cancer Screening  04/09/2022   Health Maintenance Items Addressed: 09/12/2023  Lung Cancer Screening status: Completed 04/09/2021.  Spouse has requested this test be postponed in ordering due to the pt's current condition and current residence.   Additional Screening:  Vision Screening: Recommended annual ophthalmology exams for early detection of glaucoma and other disorders of the eye.  Dental Screening: Recommended annual dental exams for proper oral hygiene  Community Resource Referral / Chronic Care Management: CRR required this visit?  No   CCM required this visit?  No     Plan:     I have personally reviewed and noted the following in the patient's chart:   Medical and social history Use of alcohol, tobacco or illicit drugs  Current medications and supplements including opioid prescriptions. Patient is not currently taking opioid prescriptions. Functional ability and status Nutritional status Physical activity Advanced directives List of other physicians Hospitalizations, surgeries, and ER visits in previous 12 months Vitals Screenings to include cognitive, depression, and falls Referrals and appointments  In addition, I have reviewed and discussed with  patient certain preventive protocols, quality metrics, and best practice recommendations. A written personalized care plan for preventive services as well as general preventive health recommendations were provided to patient.     Darreld Mclean, CMA   09/12/2023   After Visit Summary: (Declined) Due to this being a telephonic visit, with patients personalized plan was offered to patient but patient Declined AVS at this time   Notes: Please refer to Routing Comments.

## 2023-09-14 ENCOUNTER — Encounter: Payer: Self-pay | Admitting: Internal Medicine

## 2023-09-14 NOTE — Progress Notes (Signed)
 Subjective:   Hunter Boone is a 78 y.o. who presents for a Medicare Wellness preventive visit.  Visit Complete: Virtual I connected with  Hunter Boone on 09/14/23 by a audio enabled telemedicine application and verified that I am speaking with the correct person using two identifiers.  Patient Location: Skilled Nursing Facility - [facility] (on [address] in [place], [place])  Provider Location: Office/Clinic  I discussed the limitations of evaluation and management by telemedicine. The patient expressed understanding and agreed to proceed.  Vital Signs: Because this visit was a virtual/telehealth visit, some criteria may be missing or patient reported. Any vitals not documented were not able to be obtained and vitals that have been documented are patient reported.  VideoDeclined- This patient declined Librarian, academic. Therefore the visit was completed with audio only.  Persons Participating in Visit: Patient assisted by Spouse, Hunter Boone.  AWV Questionnaire: No: Patient Medicare AWV questionnaire was not completed prior to this visit.  Cardiac Risk Factors include: advanced age (>86men, >97 women);hypertension;dyslipidemia;male gender     Objective:    Today's Vitals   09/12/23 1211  Weight: 142 lb (64.4 kg)  Height: 6' (1.829 m)   Body mass index is 19.26 kg/m.     09/12/2023   12:05 PM 07/26/2023    3:44 PM 04/06/2023   12:58 PM 10/05/2022    1:00 PM 09/07/2022    1:07 PM 12/31/2021    9:34 AM 11/27/2021   11:22 AM  Advanced Directives  Does Patient Have a Medical Advance Directive? Yes Yes No Yes Yes No No  Type of Estate agent of [place];Living will Healthcare Power of [employer] Power of [employer] Power of [place];Living will    Does patient want to make changes to medical advance directive?  No - Patient declined  No - Guardian declined     Copy of Healthcare Power of Attorney  in Chart? No - copy requested No - copy requested  No - copy requested No - copy requested    Would patient like information on creating a medical advance directive?   No - Patient declined        Current Medications (verified) Outpatient Encounter Medications as of 09/12/2023  Medication Sig   acetaminophen (TYLENOL) 325 MG tablet Take 2 tablets (650 mg total) by mouth every 6 (six) hours as needed.   escitalopram (LEXAPRO) 10 MG tablet Take 1 tablet (10 mg total) by mouth daily.   furosemide (LASIX) 20 MG tablet Take 1 tablet (20 mg total) by mouth daily.   guaiFENesin (MUCINEX) 600 MG 12 hr tablet Take 1 tablet (600 mg total) by mouth 2 (two) times daily as needed.   ipratropium (ATROVENT) 0.03 % nasal spray Place 2 sprays into both nostrils every 12 (twelve) hours.   losartan (COZAAR) 100 MG tablet TAKE 1 TABLET BY MOUTH  DAILY   QUEtiapine (SEROQUEL) 100 MG tablet Take 1 tablet (100 mg total) by mouth at bedtime.   QUEtiapine (SEROQUEL) 25 MG tablet Take one tab daily at 4 pm   rosuvastatin (CRESTOR) 5 MG tablet TAKE 1 TABLET BY MOUTH  DAILY   senna-docusate (SENOKOT-S) 8.6-50 MG tablet Take 1 tablet by mouth at bedtime as needed for mild constipation.   tamsulosin (FLOMAX) 0.4 MG CAPS capsule TAKE 1 CAPSULE BY MOUTH DAILY   Zinc Oxide (BAZA PROTECT MOISTURE BARRIER) 12 % CREA Apply 1 Application topically 2 (two) times daily.   [DISCONTINUED] Multiple  Vitamins-Minerals (CENTRUM PO) Take 1 tablet by mouth daily.   [DISCONTINUED] rivastigmine (EXELON) 1.5 MG capsule Take 1 capsule at night for  1 week and then one capsule 2 times a day   No facility-administered encounter medications on file as of 09/12/2023.    Allergies (verified) Namenda [memantine]   History: Past Medical History:  Diagnosis Date   Aortic atherosclerosis (HCC)    chronic--- followed by pcp   BPH without obstruction/lower urinary tract symptoms    urologist--- dr bell   COPD with emphysema (HCC)    followed  by pcp---  never used oxygen,  still smokes 1 ppd   Diverticulosis of colon    Full dentures    Heart murmur    per pt's pcp note 02-27-2020 in epic , she heard murmur   Hemorrhoids    History of adenomatous polyp of colon    History of concussion    per pt in [place] ,  no resiudal   History of elevated PSA    negative bx 2019   History of gastroesophageal reflux (GERD)    03-06-2020 per pt no issues for several yrs   History of pneumothorax 2017   traumatic due to fall   Hypertension    followed by pcp   Left inguinal hernia    Macular degeneration, left eye    Mild neurocognitive disorder    neruologist--- dr Karel Jarvis   Nodule of right lung    lower lobe--- followed by pcp--- last chest CT 05-07-2019 stable   Pre-diabetes    Wears glasses    Past Surgical History:  Procedure Laterality Date   COLONOSCOPY  last one 04-20-2016   EVALUATION UNDER ANESTHESIA WITH HEMORRHOIDECTOMY N/A 07/01/2017   Procedure: ANORECTAL EXAMINATION UNDER ANESTHESIA, HEMORRHOIDECTOMY, HEMORRHOIDAL LIGATION/PEXY;  Surgeon: Karie Soda, MD;  Location: [facility];  Service: General;  Laterality: N/A;  GENERAL AND LOCAL   INGUINAL HERNIA REPAIR N/A 03/12/2020   Procedure: LAPARSCOPIC LEFT INGUINAL HERNIA REPAIR; BILATERAL FEMORAL HERNIA REPAIR; BILATERAL TAP BLOCK;  Surgeon: Karie Soda, MD;  Location: [facility];  Service: General;  Laterality: N/A;   LAPAROSCOPIC ABDOMINAL EXPLORATION N/A 03/12/2020   Procedure: LAPAROSCOPIC  EXPLORATION AND REPAIRS OF HERNIAS FOUND;  Surgeon: Karie Soda, MD;  Location: [facility];  Service: General;  Laterality: N/A;   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   Procedure: TOTAL HIP ARTHROPLASTY ANTERIOR APPROACH;  Surgeon: Samson Frederic, MD;  Location: [facility];  Service: Orthopedics;  Laterality: Left;   Family History  Problem Relation Age of Onset   Heart attack Father        Early 68s   Diabetes Father    Dementia Father         Unspecified type   Diabetes Mother    Dementia Mother        Unspecified type, said to be early-stages   Liver cancer Maternal Aunt    Alzheimer's disease Sister        Symptom onset in mid to late 30s   Stroke Neg Hx    COPD Neg Hx    Colon cancer Neg Hx    Esophageal cancer Neg Hx    Rectal cancer Neg Hx    Stomach cancer Neg Hx    Asthma Neg Hx    Social History   Socioeconomic History   Marital status: Married    Spouse name: Not on file   Number of children: Not on file   Years of education: 48  Highest education level: Associate degree: academic program  Occupational History   Occupation: Minister  Tobacco Use   Smoking status: Former    Current packs/day: 1.00    Average packs/day: 1 pack/day for 56.0 years (56.0 ttl pk-yrs)    Types: Cigarettes, Pipe    Passive exposure: Past   Smokeless tobacco: Never   Tobacco comments:    03-06-2020 per pt quit pipe approx. 02/ 2021,  smoked since age 78    09/07/2022 quit smoking in February 2024  Vaping Use   Vaping status: Never Used  Substance and Sexual Activity   Alcohol use: Not Currently   Drug use: Never   Sexual activity: Not Currently  Other Topics Concern   Not on file  Social History Narrative   Currently residing at [facility] in [place], [place] (on [address])      Right handed      Highest Level of edu- some college   Social Drivers of Health   Financial Resource Strain: Low Risk  (09/12/2023)   Overall Financial Resource Strain (CARDIA)    Difficulty of Paying Living Expenses: Not hard at all  Food Insecurity: No Food Insecurity (09/12/2023)   Hunger Vital Sign    Worried About Running Out of Food in the Last Year: Never true    Ran Out of Food in the Last Year: Never true  Transportation Needs: No Transportation Needs (09/12/2023)   PRAPARE - Administrator, Civil Service (Medical): No    Lack of Transportation (Non-Medical): No  Physical Activity: Insufficiently Active  (09/12/2023)   Exercise Vital Sign    Days of Exercise per Week: 5 days    Minutes of Exercise per Session: 20 min  Stress: Stress Concern Present (09/12/2023)   [employer] of Occupational Health - Occupational Stress Questionnaire    Feeling of Stress : To some extent  Social Connections: Moderately Isolated (09/12/2023)   Social Connection and Isolation Panel [NHANES]    Frequency of Communication with Friends and Family: More than three times a week    Frequency of Social Gatherings with Friends and Family: Never    Attends Religious Services: Never    Database administrator or Organizations: No    Attends Banker Meetings: Never    Marital Status: Married    Tobacco Counseling Counseling given: No Tobacco comments: 03-06-2020 per pt quit pipe approx. 02/ 2021,  smoked since age 73 09/07/2022 quit smoking in February 2024    Clinical Intake:  Pre-visit preparation completed: Yes  Pain : No/denies pain     BMI - recorded: 19.26 Nutritional Status: BMI <19  Underweight Nutritional Risks: None Diabetes: No  Lab Results  Component Value Date   HGBA1C 6.4 01/21/2023   HGBA1C 5.7 09/30/2021   HGBA1C 5.7 03/31/2021     How often do you need to have someone help you when you read instructions, pamphlets, or other written materials from your doctor or pharmacy?: 1 - Never  Interpreter Needed?: No  Information entered by :: Hassell Halim, CMA  Activities of Daily Living     09/12/2023   12:16 PM 07/26/2023    3:00 PM  In your present state of health, do you have any difficulty performing the following activities:  Hearing? 0 0  Vision? 0 0  Difficulty concentrating or making decisions? 1 1  Comment currently residing at a Memory Unit   Walking or climbing stairs? 0   Dressing or bathing? 1  Comment yes  -Spouse and currently resideds at Memory Care > Nurses/CNAs   Doing errands, shopping? 1 0  Preparing Food and eating ? N   Using the Toilet? Y    Comment Spouse and currently resides at Memory Care > Nurses/CNAs   In the past six months, have you accidently leaked urine? Y   Comment wears a depend   Do you have problems with loss of bowel control? N   Managing your Medications? Y   Comment Spouse and currently resides at Memory Care > Nurses/CNAs   Managing your Finances? Y   Comment Spouse handles   Housekeeping or managing your Housekeeping? Y   Comment Spouse and currently resides at Memory Care > Nurses/CNAs     Patient Care Team: Pincus Sanes, MD as PCP - General (Internal Medicine) Karie Soda, MD as Consulting Physician (General Surgery) Pyrtle, Carie Caddy, MD as Consulting Physician (Gastroenterology) Van Clines, MD as Consulting Physician (Neurology)  Indicate any recent Medical Services you may have received from other than [facility] providers in the past year (date may be approximate).     Assessment:   This is a routine wellness examination for Kristain.  Hearing/Vision screen Hearing Screening - Comments:: Denies hearing difficulties   Vision Screening - Comments:: Wears rx glasses    Goals Addressed               This Visit's Progress     Patient Stated (pt-stated)        Plan for pt is to continue memory activities.       Depression Screen     09/12/2023   12:32 PM 06/27/2023   11:47 AM 01/21/2023    2:27 PM 09/07/2022    1:16 PM 02/03/2022    1:47 PM 12/04/2021   10:56 AM 09/03/2021    1:35 PM  PHQ 2/9 Scores  PHQ - 2 Score 1 0 2 0 0 0 1  PHQ- 9 Score 7  6  0      Fall Risk     09/12/2023   12:20 PM 06/27/2023   11:47 AM 04/06/2023   12:58 PM 01/21/2023    2:27 PM 10/05/2022    1:00 PM  Fall Risk   Falls in the past year? 0 0 0 0 0  Number falls in past yr: 0 0 0 0 0  Injury with Fall? 0 0 0 0 0  Risk for fall due to : No Fall Risks No Fall Risks  No Fall Risks   Follow up Falls prevention discussed;Falls evaluation completed Falls evaluation completed Falls evaluation completed Falls  evaluation completed Falls evaluation completed    MEDICARE RISK AT HOME: currently at [facility] Medicare Risk at Home Any stairs in or around the home?: No If so, are there any without handrails?: No Home free of loose throw rugs in walkways, pet beds, electrical cords, etc?: Yes Adequate lighting in your home to reduce risk of falls?: Yes Life alert?: Yes (residing at [facility]) Use of a cane, walker or w/c?: No Grab bars in the bathroom?: Yes Shower chair or bench in shower?: Yes Elevated toilet seat or a handicapped toilet?: Yes  TIMED UP AND GO:  Was the test performed?  No  Cognitive Function: Impaired: Cognitive status assessed by direct observation. Patient is unable to complete screening 6CIT or other cognitive screening test. Currently residing at [facility] - Memory unit.      10/01/2021   10:00 AM  01/23/2019    8:00 AM  Montreal Cognitive Assessment   Visuospatial/ Executive (0/5) 1   Naming (0/3) 2   Attention: Read list of digits (0/2) 2 2  Attention: Read list of letters (0/1) 1 1  Attention: Serial 7 subtraction starting at 100 (0/3) 1 3  Language: Repeat phrase (0/2) 2 2  Language : Fluency (0/1) 1 0  Abstraction (0/2) 1 1  Delayed Recall (0/5) 0 5  Orientation (0/6) 1 6  Total 12   Adjusted Score (based on education) 12       Immunizations Immunization History  Administered Date(s) Administered   Fluad Quad(high Dose 65+) 03/15/2019, 02/27/2020, 03/31/2021, 04/14/2022   Influenza Whole 03/14/2012   Influenza, High Dose Seasonal PF 04/17/2013, 03/12/2014, 04/15/2015, 03/09/2016, 03/28/2018, 04/05/2019, 03/16/2023   Moderna Covid-19 Fall Seasonal Vaccine 41yrs & older 04/28/2023   PFIZER(Purple Top)SARS-COV-2 Vaccination 08/04/2019, 08/28/2019, 04/12/2020   Pfizer Covid-19 Vaccine Bivalent Booster 51yrs & up 04/08/2021, 04/14/2022   Pfizer(Comirnaty)Fall Seasonal Vaccine 12 years and older 04/14/2022   Pneumococcal  Conjugate-13 06/11/2016   Pneumococcal Polysaccharide-23 08/19/2010   Td 07/27/2004   Tdap 06/05/2012, 04/01/2022   Zoster Recombinant(Shingrix) 07/13/2018, 02/09/2019   Zoster, Live 01/23/2013, 01/24/2013    Screening Tests Health Maintenance  Topic Date Due   Lung Cancer Screening  04/09/2022   COVID-19 Vaccine (7 - 2024-25 season) 10/26/2023   INFLUENZA VACCINE  01/13/2024   Medicare Annual Wellness (AWV)  09/11/2024   DTaP/Tdap/Td (4 - Td or Tdap) 04/01/2032   Pneumonia Vaccine 29+ Years old  Completed   Hepatitis C Screening  Completed   Zoster Vaccines- Shingrix  Completed   HPV VACCINES  Aged Out   Colonoscopy  Discontinued    Health Maintenance  Health Maintenance Due  Topic Date Due   Lung Cancer Screening  04/09/2022   Health Maintenance Items Addressed: 09/12/2023  Lung Cancer Screening status: Completed 04/09/2021.  Spouse has requested this test be postponed in ordering due to the pt's current condition and current residence.   Additional Screening:  Vision Screening: Recommended annual ophthalmology exams for early detection of glaucoma and other disorders of the eye.  Dental Screening: Recommended annual dental exams for proper oral hygiene  Community Resource Referral / Chronic Care Management: CRR required this visit?  No   CCM required this visit?  No     Plan:     I have personally reviewed and noted the following in the patient's chart:   Medical and social history Use of alcohol, tobacco or illicit drugs  Current medications and supplements including opioid prescriptions. Patient is not currently taking opioid prescriptions. Functional ability and status Nutritional status Physical activity Advanced directives List of other physicians Hospitalizations, surgeries, and ER visits in previous 12 months Vitals Screenings to include cognitive, depression, and falls Referrals and appointments  In addition, I have reviewed and discussed  with patient certain preventive protocols, quality metrics, and best practice recommendations. A written personalized care plan for preventive services as well as general preventive health recommendations were provided to patient.     Darreld Mclean, CMA   09/14/2023   After Visit Summary: (Declined) Due to this being a telephonic visit, with patients personalized plan was offered to patient but patient Declined AVS at this time   Notes: Please refer to Routing Comments.

## 2023-10-19 ENCOUNTER — Ambulatory Visit: Admitting: Physician Assistant

## 2023-11-11 ENCOUNTER — Telehealth: Payer: Self-pay | Admitting: Internal Medicine

## 2023-11-11 NOTE — Telephone Encounter (Signed)
 Copied from CRM [id number]. Topic: General - Other >> Nov 11, 2023 10:51 AM Alyse July wrote: Reason for CRM: [facility] like [facility] call back when order summary document is completed. Per caller pharmacy needs to be update every 6 months in order for patient to continue received medication. Contact number: [phone]

## 2023-11-11 NOTE — Telephone Encounter (Signed)
 Spoke with Hunter Boone today.  Form refaxed to us  and paperwork faxed back to her.

## 2023-12-06 ENCOUNTER — Telehealth: Payer: Self-pay | Admitting: Internal Medicine

## 2023-12-06 NOTE — Telephone Encounter (Signed)
 Placed in Dr. Geofm carbon folder for signatures.

## 2023-12-06 NOTE — Telephone Encounter (Signed)
 [facility] representative dropped off document [facility], to be filled out by provider. Patient requested to send it back via Call Dayonna Young to pick up within 7-days. Document is located in providers tray at front office.Please advise at [phone]   Davia Salt [email or web address] [phone]

## 2023-12-07 NOTE — Telephone Encounter (Signed)
 Forms emailed back to Dayonna this morning.

## 2023-12-28 ENCOUNTER — Encounter: Payer: Self-pay | Admitting: Internal Medicine

## 2023-12-28 NOTE — Progress Notes (Signed)
      Subjective:    Patient ID: Hunter Boone, male    DOB: 11/16/1945, 78 y.o.   MRN: [id number]     HPI Hunter Boone is here for follow up of his chronic medical problems.    Medications and allergies reviewed with patient and updated if appropriate.  Current Outpatient Medications on File Prior to Visit  Medication Sig Dispense Refill  . escitalopram  (LEXAPRO ) 10 MG tablet Take 1 tablet (10 mg total) by mouth daily. 30 tablet 5  . furosemide  (LASIX ) 20 MG tablet Take 1 tablet (20 mg total) by mouth daily. 30 tablet 0  . ipratropium (ATROVENT ) 0.03 % nasal spray Place 2 sprays into both nostrils every 12 (twelve) hours. 30 mL 12  . losartan  (COZAAR ) 100 MG tablet TAKE 1 TABLET BY MOUTH  DAILY 90 tablet 3  . QUEtiapine  (SEROQUEL ) 100 MG tablet Take 1 tablet (100 mg total) by mouth at bedtime. 90 tablet 1  . QUEtiapine  (SEROQUEL ) 25 MG tablet Take one tab daily at 4 pm 30 tablet 5  . rosuvastatin  (CRESTOR ) 5 MG tablet TAKE 1 TABLET BY MOUTH  DAILY 90 tablet 3  . tamsulosin  (FLOMAX ) 0.4 MG CAPS capsule TAKE 1 CAPSULE BY MOUTH DAILY 100 capsule 2  . Zinc Oxide (BAZA PROTECT MOISTURE BARRIER) 12 % CREA Apply 1 Application topically 2 (two) times daily.     No current facility-administered medications on file prior to visit.     Review of Systems     Objective:  There were no vitals filed for this visit. BP Readings from Last 3 Encounters:  01/13/24 106/64  08/12/23 128/76  07/28/23 133/70   Wt Readings from Last 3 Encounters:  01/13/24 147 lb 2 oz (66.7 kg)  09/12/23 142 lb (64.4 kg)  08/12/23 136 lb 6.4 oz (61.9 kg)   There is no height or weight on file to calculate BMI.    Physical Exam     Lab Results  Component Value Date   WBC 11.4 (H) 01/13/2024   HGB 13.1 01/13/2024   HCT 40.9 01/13/2024   PLT 324.0 01/13/2024   GLUCOSE 84 01/13/2024   CHOL 118 01/13/2024   TRIG 112.0 01/13/2024   HDL 44.70 01/13/2024   LDLCALC 51 01/13/2024   ALT 16 01/13/2024   AST  19 01/13/2024   NA 144 01/13/2024   K 3.6 01/13/2024   CL 104 01/13/2024   CREATININE 1.00 01/13/2024   BUN 22 01/13/2024   CO2 30 01/13/2024   TSH 1.60 01/21/2023   PSA 9.52 (H) 08/16/2017   INR 1.01 05/24/2016   HGBA1C 6.4 01/13/2024     Assessment & Plan:    See Problem List for Assessment and Plan of chronic medical problems.    This encounter was created in error - please disregard.

## 2023-12-28 NOTE — Patient Instructions (Addendum)
      Blood work was ordered.       Medications changes include :   None    A referral was ordered and someone will call you to schedule an appointment.     Return in about 6 months (around 07/04/2024) for follow up.

## 2023-12-30 ENCOUNTER — Encounter: Payer: Self-pay | Admitting: Advanced Practice Midwife

## 2024-01-02 ENCOUNTER — Encounter: Payer: Medicare Other | Admitting: Internal Medicine

## 2024-01-12 NOTE — Progress Notes (Unsigned)
 Subjective:    Patient ID: Hunter Boone, male    DOB: Dec 09, 1945, 78 y.o.   MRN: [id number]     HPI Hunter Boone is here for follow up of his chronic medical problems.    Medications and allergies reviewed with patient and updated if appropriate.  Current Outpatient Medications on File Prior to Visit  Medication Sig Dispense Refill   escitalopram  (LEXAPRO ) 10 MG tablet Take 1 tablet (10 mg total) by mouth daily. 30 tablet 5   furosemide  (LASIX ) 20 MG tablet Take 1 tablet (20 mg total) by mouth daily. 30 tablet 0   ipratropium (ATROVENT ) 0.03 % nasal spray Place 2 sprays into both nostrils every 12 (twelve) hours. 30 mL 12   losartan  (COZAAR ) 100 MG tablet TAKE 1 TABLET BY MOUTH  DAILY 90 tablet 3   QUEtiapine  (SEROQUEL ) 100 MG tablet Take 1 tablet (100 mg total) by mouth at bedtime. 90 tablet 1   QUEtiapine  (SEROQUEL ) 25 MG tablet Take one tab daily at 4 pm 30 tablet 5   rosuvastatin  (CRESTOR ) 5 MG tablet TAKE 1 TABLET BY MOUTH  DAILY 90 tablet 3   tamsulosin  (FLOMAX ) 0.4 MG CAPS capsule TAKE 1 CAPSULE BY MOUTH DAILY 100 capsule 2   acetaminophen  (TYLENOL ) 325 MG tablet Take 2 tablets (650 mg total) by mouth every 6 (six) hours as needed. (Patient not taking: Reported on 01/13/2024) 90 tablet 3   guaiFENesin  (MUCINEX ) 600 MG 12 hr tablet Take 1 tablet (600 mg total) by mouth 2 (two) times daily as needed. (Patient not taking: Reported on 01/13/2024) 30 tablet 2   senna-docusate (SENOKOT-S) 8.6-50 MG tablet Take 1 tablet by mouth at bedtime as needed for mild constipation. (Patient not taking: Reported on 01/13/2024) 30 tablet 0   Zinc Oxide (BAZA PROTECT MOISTURE BARRIER) 12 % CREA Apply 1 Application topically 2 (two) times daily.     No current facility-administered medications on file prior to visit.     Review of Systems  Constitutional:  Negative for appetite change and fever.  Respiratory:  Positive for shortness of breath (with exertion) and wheezing (occ). Negative for cough.    Cardiovascular:  Negative for chest pain, palpitations and leg swelling.  Neurological:  Negative for light-headedness and headaches.       Objective:   Vitals:   01/13/24 1331  BP: 106/64  Pulse: 87  Temp: 97.9 F (36.6 C)  SpO2: 97%   BP Readings from Last 3 Encounters:  01/13/24 106/64  08/12/23 128/76  07/28/23 133/70   Wt Readings from Last 3 Encounters:  01/13/24 147 lb 2 oz (66.7 kg)  09/12/23 142 lb (64.4 kg)  08/12/23 136 lb 6.4 oz (61.9 kg)   Body mass index is 19.95 kg/m.    Physical Exam Constitutional:      General: He is not in acute distress.    Appearance: Normal appearance. He is not ill-appearing.  HENT:     Head: Normocephalic and atraumatic.  Eyes:     Conjunctiva/sclera: Conjunctivae normal.  Cardiovascular:     Rate and Rhythm: Normal rate and regular rhythm.     Heart sounds: Normal heart sounds.  Pulmonary:     Effort: Pulmonary effort is normal. No respiratory distress.     Breath sounds: Normal breath sounds. No wheezing or rales.  Musculoskeletal:     Right lower leg: No edema.     Left lower leg: No edema.  Skin:    General: Skin is warm  and dry.     Findings: No rash.  Neurological:     Mental Status: He is alert. Mental status is at baseline.  Psychiatric:        Mood and Affect: Mood normal.        Lab Results  Component Value Date   WBC 16.7 (H) 07/27/2023   HGB 12.7 (L) 07/27/2023   HCT 40.6 07/27/2023   PLT 284 07/27/2023   GLUCOSE 110 (H) 07/28/2023   CHOL 95 01/21/2023   TRIG 121.0 01/21/2023   HDL 35.60 (L) 01/21/2023   LDLCALC 35 01/21/2023   ALT 52 01/21/2023   AST 41 (H) 01/21/2023   NA 144 07/28/2023   K 3.5 07/28/2023   CL 111 07/28/2023   CREATININE 0.96 07/28/2023   BUN 22 07/28/2023   CO2 25 07/28/2023   TSH 1.60 01/21/2023   PSA 9.52 (H) 08/16/2017   INR 1.01 05/24/2016   HGBA1C 6.4 01/21/2023     Assessment & Plan:    See Problem List for Assessment and Plan of chronic medical  problems.

## 2024-01-12 NOTE — Patient Instructions (Addendum)
      Blood work was ordered.       Medications changes include :   discontinue senokot-s     Return in about 6 months (around 07/15/2024) for follow up.

## 2024-01-13 ENCOUNTER — Encounter: Payer: Self-pay | Admitting: Internal Medicine

## 2024-01-13 ENCOUNTER — Ambulatory Visit: Admitting: Internal Medicine

## 2024-01-13 VITALS — BP 106/64 | HR 87 | Temp 97.9°F | Ht 72.0 in | Wt 147.1 lb

## 2024-01-13 DIAGNOSIS — E7849 Other hyperlipidemia: Secondary | ICD-10-CM

## 2024-01-13 DIAGNOSIS — R7303 Prediabetes: Secondary | ICD-10-CM

## 2024-01-13 DIAGNOSIS — U071 COVID-19: Secondary | ICD-10-CM

## 2024-01-13 DIAGNOSIS — I10 Essential (primary) hypertension: Secondary | ICD-10-CM | POA: Diagnosis not present

## 2024-01-13 DIAGNOSIS — N138 Other obstructive and reflux uropathy: Secondary | ICD-10-CM | POA: Diagnosis not present

## 2024-01-13 DIAGNOSIS — D72829 Elevated white blood cell count, unspecified: Secondary | ICD-10-CM

## 2024-01-13 DIAGNOSIS — R600 Localized edema: Secondary | ICD-10-CM | POA: Diagnosis not present

## 2024-01-13 DIAGNOSIS — F03911 Unspecified dementia, unspecified severity, with agitation: Secondary | ICD-10-CM

## 2024-01-13 DIAGNOSIS — N401 Enlarged prostate with lower urinary tract symptoms: Secondary | ICD-10-CM

## 2024-01-13 LAB — COMPREHENSIVE METABOLIC PANEL WITH GFR
ALT: 16 U/L (ref 0–53)
AST: 19 U/L (ref 0–37)
Albumin: 4 g/dL (ref 3.5–5.2)
Alkaline Phosphatase: 92 U/L (ref 39–117)
BUN: 22 mg/dL (ref 6–23)
CO2: 30 meq/L (ref 19–32)
Calcium: 9.4 mg/dL (ref 8.4–10.5)
Chloride: 104 meq/L (ref 96–112)
Creatinine, Ser: 1 mg/dL (ref 0.40–1.50)
GFR: 72.11 mL/min (ref >60.00)
Glucose, Bld: 84 mg/dL (ref 70–99)
Potassium: 3.6 meq/L (ref 3.5–5.1)
Sodium: 144 meq/L (ref 135–145)
Total Bilirubin: 0.6 mg/dL (ref 0.2–1.2)
Total Protein: 8.1 g/dL (ref 6.0–8.3)

## 2024-01-13 LAB — CBC WITH DIFFERENTIAL/PLATELET
Basophils Absolute: 0.1 K/uL (ref 0.0–0.1)
Basophils Relative: 0.5 % (ref 0.0–3.0)
Eosinophils Absolute: 0.5 K/uL (ref 0.0–0.7)
Eosinophils Relative: 4.5 % (ref 0.0–5.0)
HCT: 40.9 % (ref 39.0–52.0)
Hemoglobin: 13.1 g/dL (ref 13.0–17.0)
Lymphocytes Relative: 22.9 % (ref 12.0–46.0)
Lymphs Abs: 2.6 K/uL (ref 0.7–4.0)
MCHC: 31.9 g/dL (ref 30.0–36.0)
MCV: 90.2 fl (ref 78.0–100.0)
Monocytes Absolute: 1 K/uL (ref 0.1–1.0)
Monocytes Relative: 8.6 % (ref 3.0–12.0)
Neutro Abs: 7.2 K/uL (ref 1.4–7.7)
Neutrophils Relative %: 63.5 % (ref 43.0–77.0)
Platelets: 324 K/uL (ref 150.0–400.0)
RBC: 4.54 Mil/uL (ref 4.22–5.81)
RDW: 15.2 % (ref 11.5–15.5)
WBC: 11.4 K/uL — ABNORMAL HIGH (ref 4.0–10.5)

## 2024-01-13 LAB — HEMOGLOBIN A1C: Hgb A1c MFr Bld: 6.4 % (ref 4.6–6.5)

## 2024-01-13 LAB — LIPID PANEL
Cholesterol: 118 mg/dL (ref 0–200)
HDL: 44.7 mg/dL (ref >39.00)
LDL Cholesterol: 51 mg/dL (ref 0–99)
NonHDL: 73.29
Total CHOL/HDL Ratio: 3
Triglycerides: 112 mg/dL (ref 0.0–149.0)
VLDL: 22.4 mg/dL (ref 0.0–40.0)

## 2024-01-13 MED ORDER — ACETAMINOPHEN 325 MG PO TABS: 650.0000 mg | ORAL_TABLET | Freq: Four times a day (QID) | ORAL | Status: AC | PRN

## 2024-01-13 MED ORDER — GUAIFENESIN ER 600 MG PO TB12: 600.0000 mg | ORAL_TABLET | Freq: Two times a day (BID) | ORAL | Status: AC | PRN

## 2024-01-13 NOTE — Assessment & Plan Note (Signed)
 Chronic Blood pressure well controlled CBC, CMP Continue losartan  100 mg daily

## 2024-01-13 NOTE — Assessment & Plan Note (Signed)
 Chronic Lab Results  Component Value Date   LDLCALC 35 01/21/2023    Check lipid panel Continue Crestor  5 mg daily

## 2024-01-13 NOTE — Assessment & Plan Note (Signed)
 History of leukocytosis CBC

## 2024-01-13 NOTE — Assessment & Plan Note (Signed)
 Chronic Controlled He is sleeping in the bed which has helped significantly Wearing compression socks Continue Lasix 20 mg daily

## 2024-01-13 NOTE — Assessment & Plan Note (Addendum)
 Chronic Slightly worse Mood varies Continue Seroquel  25 mg at 4 PM and Seroquel  100 mg at bedtime which seems to be working well Continue Lexapro  10 mg daily

## 2024-01-13 NOTE — Assessment & Plan Note (Signed)
 Chronic Lab Results  Component Value Date   HGBA1C 6.4 01/21/2023    Check a1c Low sugar / carb diet

## 2024-01-13 NOTE — Assessment & Plan Note (Signed)
 Chronic Saw urology a few years ago Microscopic blood in urine from BPH Elevated psa, bx negative Continue Flomax  0.4 mg daily He feels he is urinating without difficulty

## 2024-01-14 ENCOUNTER — Ambulatory Visit: Payer: Self-pay | Admitting: Internal Medicine

## 2024-02-12 ENCOUNTER — Encounter: Payer: Self-pay | Admitting: Internal Medicine

## 2024-02-22 ENCOUNTER — Telehealth: Payer: Self-pay

## 2024-02-22 NOTE — Telephone Encounter (Signed)
 Copied from CRM #[id number]. Topic: General - Other >> Feb 22, 2024 11:19 AM Frederich PARAS wrote: Reason for CRM: pt calling to get a covid vaccine prescription .pt is in the memory care. Pt callback # is [phone]

## 2024-03-06 NOTE — Telephone Encounter (Unsigned)
 Copied from CRM #[id number]. Topic: Clinical - Medical Advice >> Mar 06, 2024 10:47 AM Burnard DEL wrote: Reason for CRM: Patients wife would like to know if it would be a good idea for her husband to get covid,flu,and rsv vaccines at the same time? 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 Please advise

## 2024-03-27 ENCOUNTER — Other Ambulatory Visit (HOSPITAL_BASED_OUTPATIENT_CLINIC_OR_DEPARTMENT_OTHER): Payer: Self-pay

## 2024-03-27 MED ORDER — COMIRNATY 30 MCG/0.3ML IM SUSY
0.3000 mL | PREFILLED_SYRINGE | Freq: Once | INTRAMUSCULAR | 0 refills | Status: AC
  Filled 2024-03-27: qty 0.3, 1d supply, fill #0

## 2024-07-16 ENCOUNTER — Ambulatory Visit: Admitting: Internal Medicine

## 2024-07-31 ENCOUNTER — Ambulatory Visit: Admitting: Internal Medicine

## 2024-09-17 ENCOUNTER — Ambulatory Visit

## 2024-10-05 ENCOUNTER — Ambulatory Visit
